# Patient Record
Sex: Male | Born: 1935 | Race: White | Hispanic: No | Marital: Married | Smoking: Never smoker
Health system: Southern US, Community
[De-identification: ages and names within clinical notes are randomized; demographics above are authoritative.]

## PROBLEM LIST (undated history)

## (undated) DIAGNOSIS — I2510 Atherosclerotic heart disease of native coronary artery without angina pectoris: Secondary | ICD-10-CM

## (undated) DIAGNOSIS — N289 Disorder of kidney and ureter, unspecified: Secondary | ICD-10-CM

## (undated) DIAGNOSIS — E669 Obesity, unspecified: Secondary | ICD-10-CM

## (undated) DIAGNOSIS — I119 Hypertensive heart disease without heart failure: Secondary | ICD-10-CM

## (undated) DIAGNOSIS — I480 Paroxysmal atrial fibrillation: Secondary | ICD-10-CM

## (undated) DIAGNOSIS — I255 Ischemic cardiomyopathy: Secondary | ICD-10-CM

## (undated) DIAGNOSIS — I340 Nonrheumatic mitral (valve) insufficiency: Secondary | ICD-10-CM

## (undated) DIAGNOSIS — I5042 Chronic combined systolic (congestive) and diastolic (congestive) heart failure: Secondary | ICD-10-CM

## (undated) DIAGNOSIS — E785 Hyperlipidemia, unspecified: Secondary | ICD-10-CM

## (undated) DIAGNOSIS — E119 Type 2 diabetes mellitus without complications: Secondary | ICD-10-CM

## (undated) DIAGNOSIS — R042 Hemoptysis: Secondary | ICD-10-CM

## (undated) HISTORY — PX: JOINT REPLACEMENT: SHX530

## (undated) HISTORY — PX: APPENDECTOMY: SHX54

## (undated) HISTORY — PX: TONSILLECTOMY: SUR1361

---

## 2011-12-06 HISTORY — PX: TOTAL KNEE ARTHROPLASTY: SHX125

## 2013-08-06 ENCOUNTER — Ambulatory Visit: Payer: Medicare Other | Attending: Sports Medicine | Admitting: Physical Therapy

## 2013-08-06 DIAGNOSIS — Z96659 Presence of unspecified artificial knee joint: Secondary | ICD-10-CM | POA: Insufficient documentation

## 2013-08-06 DIAGNOSIS — M25669 Stiffness of unspecified knee, not elsewhere classified: Secondary | ICD-10-CM | POA: Insufficient documentation

## 2013-08-06 DIAGNOSIS — IMO0001 Reserved for inherently not codable concepts without codable children: Secondary | ICD-10-CM | POA: Insufficient documentation

## 2013-08-06 DIAGNOSIS — M25569 Pain in unspecified knee: Secondary | ICD-10-CM | POA: Insufficient documentation

## 2013-08-06 DIAGNOSIS — R609 Edema, unspecified: Secondary | ICD-10-CM | POA: Insufficient documentation

## 2013-08-13 ENCOUNTER — Ambulatory Visit: Payer: Medicare Other | Admitting: Physical Therapy

## 2013-08-15 ENCOUNTER — Ambulatory Visit: Payer: Medicare Other | Admitting: Physical Therapy

## 2013-08-20 ENCOUNTER — Ambulatory Visit: Payer: Medicare Other | Admitting: Physical Therapy

## 2013-08-22 ENCOUNTER — Encounter: Payer: No Typology Code available for payment source | Admitting: Physical Therapy

## 2013-08-27 ENCOUNTER — Ambulatory Visit: Payer: Medicare Other | Admitting: Physical Therapy

## 2014-11-23 ENCOUNTER — Inpatient Hospital Stay (HOSPITAL_COMMUNITY)
Admission: EM | Admit: 2014-11-23 | Discharge: 2014-11-26 | DRG: 280 | Disposition: A | Discharge status: Home Health | Payer: Medicare HMO | Attending: Cardiology | Admitting: Cardiology

## 2014-11-23 ENCOUNTER — Emergency Department (HOSPITAL_COMMUNITY): Payer: Medicare HMO

## 2014-11-23 ENCOUNTER — Encounter (HOSPITAL_COMMUNITY): Payer: Self-pay | Admitting: *Deleted

## 2014-11-23 DIAGNOSIS — E785 Hyperlipidemia, unspecified: Secondary | ICD-10-CM | POA: Diagnosis present

## 2014-11-23 DIAGNOSIS — Z6830 Body mass index (BMI) 30.0-30.9, adult: Secondary | ICD-10-CM | POA: Diagnosis not present

## 2014-11-23 DIAGNOSIS — F419 Anxiety disorder, unspecified: Secondary | ICD-10-CM | POA: Diagnosis not present

## 2014-11-23 DIAGNOSIS — Z8249 Family history of ischemic heart disease and other diseases of the circulatory system: Secondary | ICD-10-CM

## 2014-11-23 DIAGNOSIS — R0902 Hypoxemia: Secondary | ICD-10-CM

## 2014-11-23 DIAGNOSIS — E119 Type 2 diabetes mellitus without complications: Secondary | ICD-10-CM | POA: Diagnosis present

## 2014-11-23 DIAGNOSIS — R0600 Dyspnea, unspecified: Secondary | ICD-10-CM | POA: Diagnosis present

## 2014-11-23 DIAGNOSIS — I2582 Chronic total occlusion of coronary artery: Secondary | ICD-10-CM | POA: Diagnosis present

## 2014-11-23 DIAGNOSIS — Z96652 Presence of left artificial knee joint: Secondary | ICD-10-CM | POA: Diagnosis not present

## 2014-11-23 DIAGNOSIS — I252 Old myocardial infarction: Secondary | ICD-10-CM | POA: Diagnosis present

## 2014-11-23 DIAGNOSIS — I10 Essential (primary) hypertension: Secondary | ICD-10-CM | POA: Diagnosis not present

## 2014-11-23 DIAGNOSIS — I5023 Acute on chronic systolic (congestive) heart failure: Secondary | ICD-10-CM | POA: Diagnosis present

## 2014-11-23 DIAGNOSIS — R0602 Shortness of breath: Secondary | ICD-10-CM | POA: Diagnosis not present

## 2014-11-23 DIAGNOSIS — J810 Acute pulmonary edema: Secondary | ICD-10-CM

## 2014-11-23 DIAGNOSIS — I2510 Atherosclerotic heart disease of native coronary artery without angina pectoris: Secondary | ICD-10-CM | POA: Diagnosis present

## 2014-11-23 DIAGNOSIS — I214 Non-ST elevation (NSTEMI) myocardial infarction: Secondary | ICD-10-CM | POA: Diagnosis present

## 2014-11-23 DIAGNOSIS — E669 Obesity, unspecified: Secondary | ICD-10-CM | POA: Diagnosis present

## 2014-11-23 DIAGNOSIS — I480 Paroxysmal atrial fibrillation: Secondary | ICD-10-CM | POA: Diagnosis present

## 2014-11-23 DIAGNOSIS — I509 Heart failure, unspecified: Secondary | ICD-10-CM

## 2014-11-23 DIAGNOSIS — I5021 Acute systolic (congestive) heart failure: Secondary | ICD-10-CM

## 2014-11-23 DIAGNOSIS — Z823 Family history of stroke: Secondary | ICD-10-CM

## 2014-11-23 DIAGNOSIS — I5022 Chronic systolic (congestive) heart failure: Secondary | ICD-10-CM | POA: Diagnosis present

## 2014-11-23 DIAGNOSIS — E1169 Type 2 diabetes mellitus with other specified complication: Secondary | ICD-10-CM

## 2014-11-23 HISTORY — DX: Type 2 diabetes mellitus without complications: E11.9

## 2014-11-23 HISTORY — DX: Atherosclerotic heart disease of native coronary artery without angina pectoris: I25.10

## 2014-11-23 HISTORY — DX: Hyperlipidemia, unspecified: E78.5

## 2014-11-23 HISTORY — DX: Paroxysmal atrial fibrillation: I48.0

## 2014-11-23 LAB — TSH: TSH: 5.21 u[IU]/mL — AB (ref 0.350–4.500)

## 2014-11-23 LAB — BASIC METABOLIC PANEL
ANION GAP: 19 — AB (ref 5–15)
BUN: 14 mg/dL (ref 6–23)
CALCIUM: 9.3 mg/dL (ref 8.4–10.5)
CO2: 19 meq/L (ref 19–32)
Chloride: 101 mEq/L (ref 96–112)
Creatinine, Ser: 1.05 mg/dL (ref 0.50–1.35)
GFR calc Af Amer: 74 mL/min — ABNORMAL LOW (ref >90)
GFR calc non Af Amer: 64 mL/min — ABNORMAL LOW (ref >90)
Glucose, Bld: 262 mg/dL — ABNORMAL HIGH (ref 70–99)
Potassium: 3.9 mEq/L (ref 3.7–5.3)
SODIUM: 139 meq/L (ref 137–147)

## 2014-11-23 LAB — CBC WITH DIFFERENTIAL/PLATELET
BASOS ABS: 0 10*3/uL (ref 0.0–0.1)
BASOS PCT: 0 % (ref 0–1)
EOS ABS: 0.1 10*3/uL (ref 0.0–0.7)
Eosinophils Relative: 1 % (ref 0–5)
HCT: 45.5 % (ref 39.0–52.0)
Hemoglobin: 14.6 g/dL (ref 13.0–17.0)
Lymphocytes Relative: 11 % — ABNORMAL LOW (ref 12–46)
Lymphs Abs: 1.1 10*3/uL (ref 0.7–4.0)
MCH: 30.5 pg (ref 26.0–34.0)
MCHC: 32.1 g/dL (ref 30.0–36.0)
MCV: 95 fL (ref 78.0–100.0)
Monocytes Absolute: 0.7 10*3/uL (ref 0.1–1.0)
Monocytes Relative: 7 % (ref 3–12)
NEUTROS PCT: 81 % — AB (ref 43–77)
Neutro Abs: 7.9 10*3/uL — ABNORMAL HIGH (ref 1.7–7.7)
PLATELETS: 159 10*3/uL (ref 150–400)
RBC: 4.79 MIL/uL (ref 4.22–5.81)
RDW: 14 % (ref 11.5–15.5)
WBC: 9.7 10*3/uL (ref 4.0–10.5)

## 2014-11-23 LAB — PRO B NATRIURETIC PEPTIDE: PRO B NATRI PEPTIDE: 2215 pg/mL — AB (ref 0–450)

## 2014-11-23 LAB — LIPID PANEL
CHOLESTEROL: 173 mg/dL (ref 0–200)
HDL: 48 mg/dL (ref >39)
LDL CALC: 89 mg/dL (ref 0–99)
Total CHOL/HDL Ratio: 3.6 RATIO
Triglycerides: 182 mg/dL — ABNORMAL HIGH (ref <150)
VLDL: 36 mg/dL (ref 0–40)

## 2014-11-23 LAB — GLUCOSE, CAPILLARY
Glucose-Capillary: 118 mg/dL — ABNORMAL HIGH (ref 70–99)
Glucose-Capillary: 121 mg/dL — ABNORMAL HIGH (ref 70–99)
Glucose-Capillary: 107 mg/dL — ABNORMAL HIGH (ref 70–99)

## 2014-11-23 LAB — TROPONIN I
TROPONIN I: 2.41 ng/mL — AB (ref <0.30)
Troponin I: 3.51 ng/mL (ref <0.30)
Troponin I: 4.74 ng/mL (ref <0.30)
Troponin I: 3.91 ng/mL (ref <0.30)

## 2014-11-23 LAB — HEPARIN LEVEL (UNFRACTIONATED): HEPARIN UNFRACTIONATED: 0.31 [IU]/mL (ref 0.30–0.70)

## 2014-11-23 LAB — MRSA PCR SCREENING: MRSA by PCR: NEGATIVE

## 2014-11-23 MED ORDER — SODIUM CHLORIDE 0.9 % IJ SOLN: 3.0000 mL | INTRAMUSCULAR | Status: DC | PRN

## 2014-11-23 MED ORDER — DILTIAZEM HCL 100 MG IV SOLR
5.0000 mg/h | Freq: Once | INTRAVENOUS | Status: AC
  Administered 2014-11-23: 5 mg/h via INTRAVENOUS

## 2014-11-23 MED ORDER — ACETAMINOPHEN 325 MG PO TABS
650.0000 mg | ORAL_TABLET | ORAL | Status: DC | PRN
  Administered 2014-11-23 – 2014-11-25 (×5): 650 mg via ORAL
  Filled 2014-11-23 (×5): qty 2

## 2014-11-23 MED ORDER — HEPARIN BOLUS VIA INFUSION
4000.0000 [IU] | Freq: Once | INTRAVENOUS | Status: AC
  Administered 2014-11-23: 4000 [IU] via INTRAVENOUS
  Filled 2014-11-23: qty 4000

## 2014-11-23 MED ORDER — METOPROLOL TARTRATE 12.5 MG HALF TABLET
12.5000 mg | ORAL_TABLET | Freq: Two times a day (BID) | ORAL | Status: DC
  Administered 2014-11-23 – 2014-11-25 (×6): 12.5 mg via ORAL
  Filled 2014-11-23 (×10): qty 1

## 2014-11-23 MED ORDER — NITROGLYCERIN 0.4 MG SL SUBL
0.4000 mg | SUBLINGUAL_TABLET | SUBLINGUAL | Status: DC | PRN
  Filled 2014-11-23: qty 1

## 2014-11-23 MED ORDER — NITROGLYCERIN IN D5W 200-5 MCG/ML-% IV SOLN
10.0000 ug/min | INTRAVENOUS | Status: DC
  Administered 2014-11-23: 10 ug/min via INTRAVENOUS
  Administered 2014-11-24: 25 ug/min via INTRAVENOUS
  Administered 2014-11-25: 19:00:00 30 ug/min via INTRAVENOUS
  Filled 2014-11-23 (×4): qty 250

## 2014-11-23 MED ORDER — INSULIN ASPART 100 UNIT/ML ~~LOC~~ SOLN: 0.0000 [IU] | SUBCUTANEOUS | Status: DC

## 2014-11-23 MED ORDER — FUROSEMIDE 10 MG/ML IJ SOLN
40.0000 mg | Freq: Two times a day (BID) | INTRAMUSCULAR | Status: DC
  Administered 2014-11-23 (×2): 40 mg via INTRAVENOUS
  Filled 2014-11-23 (×2): qty 4

## 2014-11-23 MED ORDER — SODIUM CHLORIDE 0.9 % IV SOLN
1.0000 mL/kg/h | INTRAVENOUS | Status: DC
  Administered 2014-11-24: 1 mL/kg/h via INTRAVENOUS

## 2014-11-23 MED ORDER — INSULIN ASPART 100 UNIT/ML ~~LOC~~ SOLN
0.0000 [IU] | SUBCUTANEOUS | Status: DC
  Administered 2014-11-23 – 2014-11-24 (×3): 2 [IU] via SUBCUTANEOUS

## 2014-11-23 MED ORDER — DILTIAZEM HCL 25 MG/5ML IV SOLN
20.0000 mg | Freq: Once | INTRAVENOUS | Status: AC
  Administered 2014-11-23: 20 mg via INTRAVENOUS
  Filled 2014-11-23: qty 5

## 2014-11-23 MED ORDER — ONDANSETRON HCL 4 MG/2ML IJ SOLN: 4.0000 mg | Freq: Four times a day (QID) | INTRAMUSCULAR | Status: DC | PRN

## 2014-11-23 MED ORDER — ASPIRIN 81 MG PO CHEW: 324.0000 mg | CHEWABLE_TABLET | ORAL | Status: DC

## 2014-11-23 MED ORDER — ASPIRIN 81 MG PO CHEW
324.0000 mg | CHEWABLE_TABLET | Freq: Once | ORAL | Status: AC
  Administered 2014-11-23: 324 mg via ORAL
  Filled 2014-11-23: qty 4

## 2014-11-23 MED ORDER — SODIUM CHLORIDE 0.9 % IV SOLN: 250.0000 mL | INTRAVENOUS | Status: DC | PRN

## 2014-11-23 MED ORDER — POTASSIUM CHLORIDE CRYS ER 20 MEQ PO TBCR
20.0000 meq | EXTENDED_RELEASE_TABLET | Freq: Every day | ORAL | Status: DC
  Administered 2014-11-23 – 2014-11-26 (×4): 20 meq via ORAL
  Filled 2014-11-23 (×4): qty 1

## 2014-11-23 MED ORDER — FUROSEMIDE 10 MG/ML IJ SOLN
40.0000 mg | Freq: Once | INTRAMUSCULAR | Status: AC
  Administered 2014-11-23: 40 mg via INTRAVENOUS
  Filled 2014-11-23: qty 4

## 2014-11-23 MED ORDER — ASPIRIN EC 81 MG PO TBEC
81.0000 mg | DELAYED_RELEASE_TABLET | Freq: Every day | ORAL | Status: DC
  Administered 2014-11-24: 81 mg via ORAL
  Filled 2014-11-23: qty 1

## 2014-11-23 MED ORDER — INFLUENZA VAC SPLIT QUAD 0.5 ML IM SUSY
0.5000 mL | PREFILLED_SYRINGE | INTRAMUSCULAR | Status: AC
  Administered 2014-11-24: 0.5 mL via INTRAMUSCULAR
  Filled 2014-11-23: qty 0.5

## 2014-11-23 MED ORDER — SODIUM CHLORIDE 0.9 % IJ SOLN
3.0000 mL | Freq: Two times a day (BID) | INTRAMUSCULAR | Status: DC
Start: 2014-11-23 — End: 2014-11-24

## 2014-11-23 MED ORDER — HEPARIN (PORCINE) IN NACL 100-0.45 UNIT/ML-% IJ SOLN
1450.0000 [IU]/h | INTRAMUSCULAR | Status: DC
  Administered 2014-11-23: 1450 [IU]/h via INTRAVENOUS
  Filled 2014-11-23 (×2): qty 250

## 2014-11-23 MED ORDER — HEPARIN (PORCINE) IN NACL 100-0.45 UNIT/ML-% IJ SOLN
1350.0000 [IU]/h | INTRAMUSCULAR | Status: DC
  Administered 2014-11-23: 1350 [IU]/h via INTRAVENOUS
  Filled 2014-11-23: qty 250

## 2014-11-23 MED ORDER — ASPIRIN 81 MG PO CHEW
81.0000 mg | CHEWABLE_TABLET | ORAL | Status: AC
  Administered 2014-11-24: 81 mg via ORAL
  Filled 2014-11-23: qty 1

## 2014-11-23 MED ORDER — ATORVASTATIN CALCIUM 40 MG PO TABS
40.0000 mg | ORAL_TABLET | Freq: Every day | ORAL | Status: DC
  Administered 2014-11-23 – 2014-11-25 (×3): 40 mg via ORAL
  Filled 2014-11-23 (×4): qty 1

## 2014-11-23 MED ORDER — ASPIRIN 300 MG RE SUPP: 300.0000 mg | RECTAL | Status: DC

## 2014-11-23 MED ORDER — CHLORHEXIDINE GLUCONATE CLOTH 2 % EX PADS
6.0000 | MEDICATED_PAD | Freq: Every day | CUTANEOUS | Status: DC
  Administered 2014-11-24: 6 via TOPICAL

## 2014-11-23 NOTE — ED Provider Notes (Signed)
CSN: [id number]     Arrival date & time 11/23/14  8299 History   First MD Initiated Contact with Patient 11/23/14 0630     Chief Complaint  Patient presents with  . Shortness of Breath     (Consider location/radiation/quality/duration/timing/severity/associated sxs/prior Treatment) Patient is a 80 y.o. male presenting with shortness of breath. The history is provided by the patient and the spouse.  Shortness of Breath He woke up this morning with severe dyspnea. He denies chest pain, heaviness, tightness, pressure. Dyspnea is worse when laying flat. He also had some milder dyspnea the previous night. He has had a slight cough which has been nonproductive. There is been no nausea or vomiting. There has been some mild diaphoresis. He is not on anything at home to treat his symptoms. He has noted some swelling of his ankles over the last several days.  Past Medical History  Diagnosis Date  . Hypertension   . Diabetes mellitus without complication    No past surgical history on file. No family history on file. History  Substance Use Topics  . Smoking status: Not on file  . Smokeless tobacco: Not on file  . Alcohol Use: Not on file    Review of Systems  Respiratory: Positive for shortness of breath.   All other systems reviewed and are negative.     Allergies  Review of patient's allergies indicates not on file.  Home Medications   Prior to Admission medications   Not on File   BP 163/79 mmHg  Pulse 86  Temp(Src) 98.6 F (37 C) (Oral)  Resp 25  Ht 5\' 11"  (1.803 m)  Wt 220 lb (99.791 kg)  BMI 30.70 kg/m2  SpO2 94% Physical Exam  Nursing note and vitals reviewed.  80 year old male, resting comfortably and in no acute distress. Vital signs are significant for tachycardia, tachypnea, and hypertension. Oxygen saturation is 88%, which is hypoxic. Oxygen saturation came up to 93% with supplemental oxygen, which is normal. Head is normocephalic and atraumatic. PERRLA,  EOMI. Oropharynx is clear. Neck is nontender and supple without adenopathy. JVD is present at 90. Back is nontender and there is no CVA tenderness. There is 1+ presacral edema. Lungs have coarse rales and rhonchi diffusely. No wheezing is present. Chest is nontender. Heart is tachycardic without murmur. Abdomen is soft, flat, nontender without masses or hepatosplenomegaly and peristalsis is normoactive. Extremities have 1+ edema of the left pretibial area, and 2+ edema of the right pretibial area, full range of motion is present. Skin is warm and dry without rash. Neurologic: Mental status is normal, cranial nerves are intact, there are no motor or sensory deficits.  ED Course  Procedures (including critical care time) Labs Review Results for orders placed or performed during the hospital encounter of 11/23/14  MRSA PCR Screening  Result Value Ref Range   MRSA by PCR NEGATIVE NEGATIVE  Basic metabolic panel  Result Value Ref Range   Sodium 139 137 - 147 mEq/L   Potassium 3.9 3.7 - 5.3 mEq/L   Chloride 101 96 - 112 mEq/L   CO2 19 19 - 32 mEq/L   Glucose, Bld 262 (H) 70 - 99 mg/dL   BUN 14 6 - 23 mg/dL   Creatinine, Ser 1.05 0.50 - 1.35 mg/dL   Calcium 9.3 8.4 - 10.5 mg/dL   GFR calc non Af Amer 64 (L) >90 mL/min   GFR calc Af Amer 74 (L) >90 mL/min   Anion gap 19 (H) 5 -  15  Pro b natriuretic peptide (BNP)  Result Value Ref Range   Pro B Natriuretic peptide (BNP) 2215.0 (H) 0 - 450 pg/mL  Troponin I  Result Value Ref Range   Troponin I 3.51 (HH) <0.30 ng/mL  CBC with Differential  Result Value Ref Range   WBC 9.7 4.0 - 10.5 K/uL   RBC 4.79 4.22 - 5.81 MIL/uL   Hemoglobin 14.6 13.0 - 17.0 g/dL   HCT 45.5 39.0 - 52.0 %   MCV 95.0 78.0 - 100.0 fL   MCH 30.5 26.0 - 34.0 pg   MCHC 32.1 30.0 - 36.0 g/dL   RDW 14.0 11.5 - 15.5 %   Platelets 159 150 - 400 K/uL   Neutrophils Relative % 81 (H) 43 - 77 %   Neutro Abs 7.9 (H) 1.7 - 7.7 K/uL   Lymphocytes Relative 11 (L) 12 - 46  %   Lymphs Abs 1.1 0.7 - 4.0 K/uL   Monocytes Relative 7 3 - 12 %   Monocytes Absolute 0.7 0.1 - 1.0 K/uL   Eosinophils Relative 1 0 - 5 %   Eosinophils Absolute 0.1 0.0 - 0.7 K/uL   Basophils Relative 0 0 - 1 %   Basophils Absolute 0.0 0.0 - 0.1 K/uL  Heparin level (unfractionated)  Result Value Ref Range   Heparin Unfractionated 0.31 0.30 - 0.70 IU/mL  Troponin I (q 6hr x 3)  Result Value Ref Range   Troponin I 2.41 (HH) <0.30 ng/mL  Troponin I (q 6hr x 3)  Result Value Ref Range   Troponin I 4.74 (HH) <0.30 ng/mL  Troponin I (q 6hr x 3)  Result Value Ref Range   Troponin I 3.91 (HH) <0.30 ng/mL  Lipid panel  Result Value Ref Range   Cholesterol 173 0 - 200 mg/dL   Triglycerides 182 (H) <150 mg/dL   HDL 48 >39 mg/dL   Total CHOL/HDL Ratio 3.6 RATIO   VLDL 36 0 - 40 mg/dL   LDL Cholesterol 89 0 - 99 mg/dL  TSH  Result Value Ref Range   TSH 5.210 (H) 0.350 - 4.500 uIU/mL  Glucose, capillary  Result Value Ref Range   Glucose-Capillary 118 (H) 70 - 99 mg/dL  Glucose, capillary  Result Value Ref Range   Glucose-Capillary 121 (H) 70 - 99 mg/dL  Glucose, capillary  Result Value Ref Range   Glucose-Capillary 107 (H) 70 - 99 mg/dL   Imaging Review Dg Chest Port 1 View  11/23/2014   CLINICAL DATA:  Acute onset of respiratory distress and shortness of breath. Audible rales and rhonchi. Initial encounter.  EXAM: PORTABLE CHEST - 1 VIEW  COMPARISON:  None.  FINDINGS: The lungs are well-aerated. Vascular congestion is noted. Bilateral central airspace opacities raise concern for pulmonary edema. A small left pleural effusion is suspected. No pneumothorax is seen.  The cardiomediastinal silhouette is borderline enlarged. No acute osseous abnormalities are seen.  IMPRESSION: Vascular congestion and borderline cardiomegaly. Bilateral central airspace opacities raise concern for pulmonary edema. Suspect small left pleural effusion.   Electronically Signed   By: Garald Balding M.D.   On:  11/23/2014 06:56   Images viewed by me.   EKG Interpretation   Date/Time:  Sunday November 23 2014 06:30:34 EST Ventricular Rate:  140 PR Interval:  79 QRS Duration: 117 QT Interval:  323 QTC Calculation: 493 R Axis:   -37 Text Interpretation:  Supraventricular tachycardia Nonspecific IVCD with  LAD LVH with secondary repolarization abnormality Anterior infarct, old  Suspect Atrial fibrillation with rapid ventricular response No old tracing  to compare Confirmed by [facility]  MD, Tajha Sammarco ([id number]) on 11/23/2014 6:42:53 AM       Date: 11/23/2014 0721  Rate: 84  Rhythm: normal sinus rhythm and premature ventricular contractions (PVC)  QRS Axis: left  Intervals: normal  ST/T Wave abnormalities: normal  Conduction Disutrbances:nonspecific intraventricular conduction delay  Narrative Interpretation: Sinus rhythm with PVCs, left ventricular hypertrophy, nonspecific intraventricular conduction delay, borderline left axis deviation. When compared with ECG of earlier today, atrial fibrillation is no longer present, and PVCs are now present  Old EKG Reviewed: changes noted  CRITICAL CARE Performed by: SAYTK,ZSWFU Total critical care time: 60 minutes Critical care time was exclusive of separately billable procedures and treating other patients. Critical care was necessary to treat or prevent imminent or life-threatening deterioration. Critical care was time spent personally by me on the following activities: development of treatment plan with patient and/or surrogate as well as nursing, discussions with consultants, evaluation of patient's response to treatment, examination of patient, obtaining history from patient or surrogate, ordering and performing treatments and interventions, ordering and review of laboratory studies, ordering and review of radiographic studies, pulse oximetry and re-evaluation of patient's condition.  MDM   Final diagnoses:  Acute systolic congestive heart failure   Hypoxia    Acute dyspnea which appears to be congestive heart failure based on rales, neck vein distention, and peripheral edema. Old records are reviewed and he has no relevant past visits. Screening labs are obtained and he is started with treatment of aspirin, furosemide, and nitroglycerin.  Initial ECG appears to show atrial fibrillation. Accordingly, he is also started on diltiazem via bolus and drip to affect her rate control. Following this treatment, heart rate dropped to 80s and was clearly sinus, so diltiazem was discontinued. He noted that he was breathing much better and was no longer diaphoretic. Blood pressure is also much lower. Chest x-ray confirms congestive heart failure with mild pulmonary edema and BNP has come back elevated. Repeat ECG shows no acute ST or T changes.  Delora Fuel, MD 93/23/55 [id number]

## 2014-11-23 NOTE — H&P (Addendum)
CARDIOLOGY HISTORY AND PHYSICAL   Patient ID: [name] [name] MRN: [id number]  DOB/AGE: 1930-05-07 80 y.o. Admit date: 11/23/2014  Primary Care Physician: Stephens Shire, MD Primary Cardiologist: Claris Che MD  Clinical Summary [name] [name] is a 80 y.o.male with no prior cardiac history presented to ER after 2 weeks of worsening dyspnea, PND, orthopnea, and LEE. Hx of DM, Hypertension, BPH. [name] [name] is normally very active, bowling, walking, fishing and has noticed over the last 2 months that his energy level has declined. Over the last two nights [name] [name] was unable to sleep, awakening short of breath with coughing and congestion. Last night [name] [name] had significant PND and orthopnea prompting him to come to ER. Denies chest pain, dizziness or near syncope. Wife said [name] [name] was gasping for breath this am.   On arrival to ER [name] [name] was in rapid atrial fib with rate of 140 bpm, with infero/lateral ST depression and intraventricular conduction abnormality. CXR determined pulmonary edema, with bilateral airspace abnormalities, borderline cardiomegaly. Pro-BNP 2,231. Creatinine 1.05. Initial troponin 3.51. [name] [name] was treated with IV lasix 40 mg, started on diltiazem gtt and converted to NSR.  [name] [name] is on NTG gtt and O2 via [place]. Breathing status is better, but continues to have DOE, moving around during assessment.   No Known Allergies  Home Medications  (Not in a hospital admission)  Scheduled Medications    Infusions . heparin     And  . heparin    . nitroGLYCERIN 10 mcg/min (11/23/14 0713)    . aspirin  324 mg Oral NOW   Or  . aspirin  300 mg Rectal NOW  . [START ON 11/24/2014] aspirin  81 mg Oral Pre-Cath  . [START ON 11/24/2014] aspirin EC  81 mg Oral Daily  . atorvastatin  40 mg Oral q1800  . [START ON 11/24/2014] Chlorhexidine Gluconate Cloth  6 each Topical Q0600  . furosemide  40 mg Intravenous BID  . [START ON 11/24/2014] insulin aspart  0-9 Units Subcutaneous 6 times per day  . metoprolol  tartrate  12.5 mg Oral BID  . potassium chloride  20 mEq Oral Daily  . sodium chloride  3 mL Intravenous Q12H     PRN Medications    Past Medical History  Diagnosis Date  . Hypertension   . Diabetes mellitus without complication     Past Surgical History  Procedure Laterality Date  . Appendectomy    . Knee Left 2013    Left Total Knee replacement    Family History  Problem Relation Age of Onset  . Cancer Father 72    Deceased  . Stroke Mother     Deceased  . Heart attack Brother     Deceased. Had cath with stents followed by PE    Social History [name] [name] reports that [name] [name] has never smoked. [name] [name] has never used smokeless tobacco. [name] [name] reports that [name] [name] drinks about 4.2 oz of alcohol per week.  Review of Systems Otherwise reviewed and negative except as outlined.  Physical Examination Temp:  [98.6 F (37 C)] 98.6 F (37 C) (12/20 0642) Pulse Rate:  [80-92] 80 (12/20 0815) Resp:  [21-28] 23 (12/20 0815) BP: (118-212)/(63-103) 118/70 mmHg (12/20 0815) SpO2:  [90 %-95 %] 93 % (12/20 0815) Weight:  [220 lb (99.791 kg)] 220 lb (99.791 kg) (12/20 0642) No intake or output data in the 24 hours ending 11/23/14 0857  Gen: Mildly dyspneic on O2 HEENT: Conjunctiva and lids normal, oropharynx clear with moist mucosa. Neck: Supple,  obese, no elevated JVP or carotid bruits, no thyromegaly. Lungs: Bilateral crackles worse on the right Cardiac: Regular rate and rhythm, 1/6 systolic murmur,  no S3  no pericardial rub. Abdomen: Mildly distended, nontender, no hepatomegaly, bowel sounds present, no guarding or rebound. Extremities: 2+ pre-tibial pitting edema, distal pulses 2+. Mild coolness in the toes. Skin: Warm and dry. Musculoskeletal: No kyphosis. Neuropsychiatric: Alert and oriented x3, affect grossly appropriate.   Lab Results  Basic Metabolic Panel:  Recent Labs Lab 11/23/14 0636  NA 139  K 3.9  CL 101  CO2 19  GLUCOSE 262*  BUN 14  CREATININE 1.05   CALCIUM 9.3    CBC:  Recent Labs Lab 11/23/14 0734  WBC 9.7  NEUTROABS 7.9*  HGB 14.6  HCT 45.5  MCV 95.0  PLT 159    Cardiac Enzymes:  Recent Labs Lab 11/23/14 0636  TROPONINI 3.51*    BNP: Invalid input(s): Ali Chukson   Radiology Dg Chest Port 1 View  11/23/2014   CLINICAL DATA:  Acute onset of respiratory distress and shortness of breath. Audible rales and rhonchi. Initial encounter.  EXAM: PORTABLE CHEST - 1 VIEW  COMPARISON:  None.  FINDINGS: The lungs are well-aerated. Vascular congestion is noted. Bilateral central airspace opacities raise concern for pulmonary edema. A small left pleural effusion is suspected. No pneumothorax is seen.  The cardiomediastinal silhouette is borderline enlarged. No acute osseous abnormalities are seen.  IMPRESSION: Vascular congestion and borderline cardiomegaly. Bilateral central airspace opacities raise concern for pulmonary edema. Suspect small left pleural effusion.   Electronically Signed   By: Garald Balding M.D.   On: 11/23/2014 06:56    Prior Cardiac Testing/Procedures:  None  ECG: Atrial fib with RVR, ST-T wave abnormalities infero/lateral.    Impression and Recommendations  1. NSTEMI:  EKG with infero/lateral depression during rapid rate with pulmonary edema. Demand ischemia in addition to probable CAD with CVRF of DM, Hypertension, Obesity, FH. [name] [name] will be planned for cardiac cath tomorrow. Cycle troponin, repeat EKG now that [name] [name] is NSR. Continue NTG but consider weaning off without chest pain and hypotension. Awaiting pharmacy to review home medication list., Start low dose BB, add ASA, statin, continue heparin, consider low dose ACE. Admit to cardiology.   2. Acute Pulmonary Edema:  Will check echo. Continue IV lasix, watch BP on NTG and diuretic. Potassium replacement. Strict I/O and daily wts.   3. Hypertension: Hypertensive on admission. Now normalized on NTG gtt.   4. Diabetes: Will check Hgb A1C. Will need to be on  sliding scale insulin. May consult PTH for diabetes management if necessary. Glucose 262 on admission.   Signed: Phill Myron. Lawrence NP AACC  11/23/2014, 8:57 AM Co-Sign MD   The patient was seen, examined and discussed with Jory Sims, NP and I agree with the above.   80 year old male with no prior cardiac history who presented with progressively worsening SOB, LE edema, orthopnea and PND that [name] [name] first noticed about a month ago. The patient is very functional at baseline, involved indifferent sports. On arrival to the ER [name] [name] was found to be in rapid atrial fibrillation that shortly afterwards cardioverted to SR (1 hour after iv Diltiazem initiation). [name] [name] is significantly fluid overloaded on physical exam with elevated troponin. Started on Heparin infusion. [name] [name] responded very well to the first dose of Lasix iv, we will continue iv diuresis and plan for cardiac cath for tomorrow. The patient will need to be re-evaluated in the am to see  if [name] [name] is able to lay flat. Continue iv Heparin, ASA, started on atorvastatin and metoprolol, we will uptitrate as needed for elevated BP. Echocardiogram is pending. We will hold metformin for cath.  Hold ACEI prior to cath as we are diuresing aggressively.   Dorothy Spark 11/23/2014

## 2014-11-23 NOTE — Progress Notes (Signed)
ANTICOAGULATION CONSULT NOTE - Initial Consult  Pharmacy Consult for Heparin Indication: chest pain/ACS  No Known Allergies  Patient Measurements: Height: 5\' 11"  (180.3 cm) Weight: 220 lb (99.791 kg) IBW/kg (Calculated) : 75.3 Heparin Dosing Weight: 89.5  Vital Signs: Temp: 98.6 F (37 C) (12/20 0642) Temp Source: Oral (12/20 0642) BP: 118/70 mmHg (12/20 0815) Pulse Rate: 80 (12/20 0815)  Labs:  Recent Labs  11/23/14 0636 11/23/14 0734  HGB  --  14.6  HCT  --  45.5  PLT  --  159  CREATININE 1.05  --   TROPONINI 3.51*  --    Estimated Creatinine Clearance: 65.3 mL/min (by C-G formula based on Cr of 1.05).  Medical History: Past Medical History  Diagnosis Date  . Hypertension   . Diabetes mellitus without complication    Medications:  Infusions:  . heparin     And  . heparin    . nitroGLYCERIN 10 mcg/min (11/23/14 8341)   Assessment: 80 yo male presents with c/o sob and difficulty breathing.  Labs reveal an elevated troponin of 3.51.  We have been asked to start IV heparin while a cardiac work-up is in process.  His H/H is WNL (14.6/45.5) and a platelet count of 159K.  He does not know home meds nor does family members.  Plan is to f/u with his pharmacy when they open.  Goal of Therapy:  Heparin level 0.3-0.7 units/ml Monitor platelets by anticoagulation protocol: Yes   Plan:  1.  Heparin 4000 units IV bolus x 1 then begin continuous infusion at 1350 units/hr.   2.  Obtain a heparin level 8 hours after starting. 3.  Monitor closely for s/s of bleeding  Rober Minion, PharmD., MS Clinical Pharmacist Pager:  [phone] Thank you for allowing pharmacy to be part of this patients care team. 11/23/2014,8:31 AM

## 2014-11-23 NOTE — Progress Notes (Signed)
ANTICOAGULATION CONSULT NOTE - Follow Up Consult  Pharmacy Consult for Heparin Indication: chest pain/ACS  No Known Allergies  Patient Measurements: Height: 5\' 11"  (180.3 cm) Weight: 218 lb 11.2 oz (99.202 kg) IBW/kg (Calculated) : 75.3 Heparin Dosing Weight: 95.6kg  Vital Signs: Temp: 98 F (36.7 C) (12/20 1214) Temp Source: Oral (12/20 1214) BP: 152/88 mmHg (12/20 1614) Pulse Rate: 79 (12/20 1614)  Labs:  Recent Labs  11/23/14 0636 11/23/14 0734 11/23/14 0903 11/23/14 1554  HGB  --  14.6  --   --   HCT  --  45.5  --   --   PLT  --  159  --   --   HEPARINUNFRC  --   --   --  0.31  CREATININE 1.05  --   --   --   TROPONINI 3.51*  --  2.41* 4.74*    Estimated Creatinine Clearance: 65.1 mL/min (by C-G formula based on Cr of 1.05).   Medications:  Heparin 1350 units/hr  Assessment: [name] [name] on heparin for CP/ACS. Heparin level (0.31) is just above goal range. Patient received a bolus ~6 hours prior that may affect level so will increase dose slightly and check follow-up level. Noted plans for cath tomorrow.  - H/H and Plts wnl - No significant bleeding reported.   Goal of Therapy:  Heparin level 0.3-0.7 units/ml Monitor platelets by anticoagulation protocol: Yes   Plan:  1. Increase heparin drip to 1450 units/hr (14.5 ml/hr) 2. Check heparin level and CBC 8 hours after rate increase 3. Follow-up post cath orders tomorrow  [place]ler, [place] 11/23/2014,6:02 PM

## 2014-11-23 NOTE — ED Notes (Signed)
Dr. Mingo Amber made aware of critical troponin

## 2014-11-23 NOTE — ED Notes (Signed)
MD at bedside. 

## 2014-11-23 NOTE — ED Notes (Signed)
Patient presents with wife stating that she woke up and he was having a hard time breathing.  Patient appears to be in distress sitting on the side of the bed.  Audible rales/rhonchi noted

## 2014-11-23 NOTE — ED Notes (Signed)
MD Meda Coffee phoned, bed request for step-down.

## 2014-11-23 NOTE — ED Notes (Signed)
Spoke with LAB CBC is clotted.

## 2014-11-24 ENCOUNTER — Encounter (HOSPITAL_COMMUNITY)
Admission: EM | Disposition: A | Discharge status: Home Health | Payer: Medicare HMO | Source: Home / Self Care | Attending: Cardiology

## 2014-11-24 ENCOUNTER — Encounter (HOSPITAL_COMMUNITY): Payer: Self-pay | Admitting: General Practice

## 2014-11-24 DIAGNOSIS — I214 Non-ST elevation (NSTEMI) myocardial infarction: Secondary | ICD-10-CM | POA: Diagnosis not present

## 2014-11-24 DIAGNOSIS — I5021 Acute systolic (congestive) heart failure: Secondary | ICD-10-CM

## 2014-11-24 DIAGNOSIS — E1169 Type 2 diabetes mellitus with other specified complication: Secondary | ICD-10-CM

## 2014-11-24 DIAGNOSIS — E669 Obesity, unspecified: Secondary | ICD-10-CM

## 2014-11-24 DIAGNOSIS — I10 Essential (primary) hypertension: Secondary | ICD-10-CM

## 2014-11-24 DIAGNOSIS — I059 Rheumatic mitral valve disease, unspecified: Secondary | ICD-10-CM

## 2014-11-24 HISTORY — PX: LEFT HEART CATHETERIZATION WITH CORONARY ANGIOGRAM: SHX5451

## 2014-11-24 HISTORY — PX: CARDIAC CATHETERIZATION: SHX172

## 2014-11-24 LAB — BASIC METABOLIC PANEL
Anion gap: 15 (ref 5–15)
BUN: 19 mg/dL (ref 6–23)
CO2: 27 mEq/L (ref 19–32)
Calcium: 9 mg/dL (ref 8.4–10.5)
Chloride: 100 mEq/L (ref 96–112)
Creatinine, Ser: 1.06 mg/dL (ref 0.50–1.35)
GFR calc Af Amer: 73 mL/min — ABNORMAL LOW (ref >90)
GFR calc non Af Amer: 63 mL/min — ABNORMAL LOW (ref >90)
GLUCOSE: 124 mg/dL — AB (ref 70–99)
Potassium: 3.6 mEq/L — ABNORMAL LOW (ref 3.7–5.3)
Sodium: 142 mEq/L (ref 137–147)

## 2014-11-24 LAB — PROTIME-INR
INR: 1.09 (ref 0.00–1.49)
Prothrombin Time: 14.2 seconds (ref 11.6–15.2)

## 2014-11-24 LAB — HEMOGLOBIN A1C
Hgb A1c MFr Bld: 6.4 % — ABNORMAL HIGH (ref <5.7)
Mean Plasma Glucose: 137 mg/dL — ABNORMAL HIGH (ref <117)

## 2014-11-24 LAB — POCT ACTIVATED CLOTTING TIME: Activated Clotting Time: 472 seconds

## 2014-11-24 LAB — GLUCOSE, CAPILLARY
Glucose-Capillary: 117 mg/dL — ABNORMAL HIGH (ref 70–99)
Glucose-Capillary: 123 mg/dL — ABNORMAL HIGH (ref 70–99)
Glucose-Capillary: 125 mg/dL — ABNORMAL HIGH (ref 70–99)
Glucose-Capillary: 125 mg/dL — ABNORMAL HIGH (ref 70–99)
Glucose-Capillary: 126 mg/dL — ABNORMAL HIGH (ref 70–99)
Glucose-Capillary: 97 mg/dL (ref 70–99)

## 2014-11-24 LAB — CBC
HCT: 42.5 % (ref 39.0–52.0)
Hemoglobin: 13.4 g/dL (ref 13.0–17.0)
MCH: 29 pg (ref 26.0–34.0)
MCHC: 31.5 g/dL (ref 30.0–36.0)
MCV: 92 fL (ref 78.0–100.0)
Platelets: 188 10*3/uL (ref 150–400)
RBC: 4.62 MIL/uL (ref 4.22–5.81)
RDW: 14.1 % (ref 11.5–15.5)
WBC: 7.9 10*3/uL (ref 4.0–10.5)

## 2014-11-24 LAB — HEPARIN LEVEL (UNFRACTIONATED): Heparin Unfractionated: 0.37 IU/mL (ref 0.30–0.70)

## 2014-11-24 SURGERY — LEFT HEART CATHETERIZATION WITH CORONARY ANGIOGRAM
Anesthesia: LOCAL

## 2014-11-24 MED ORDER — HEPARIN (PORCINE) IN NACL 100-0.45 UNIT/ML-% IJ SOLN
1600.0000 [IU]/h | INTRAMUSCULAR | Status: DC
  Administered 2014-11-24: 1450 [IU]/h via INTRAVENOUS
  Filled 2014-11-24 (×2): qty 250

## 2014-11-24 MED ORDER — LIDOCAINE HCL (PF) 1 % IJ SOLN
INTRAMUSCULAR | Status: AC
  Filled 2014-11-24: qty 30

## 2014-11-24 MED ORDER — ACETAMINOPHEN 325 MG PO TABS
650.0000 mg | ORAL_TABLET | ORAL | Status: DC | PRN
Start: 2014-11-24 — End: 2014-11-24

## 2014-11-24 MED ORDER — MIDAZOLAM HCL 2 MG/2ML IJ SOLN
INTRAMUSCULAR | Status: AC
  Filled 2014-11-24: qty 2

## 2014-11-24 MED ORDER — NITROGLYCERIN 1 MG/10 ML FOR IR/CATH LAB
INTRA_ARTERIAL | Status: AC
  Filled 2014-11-24: qty 10

## 2014-11-24 MED ORDER — FENTANYL CITRATE 0.05 MG/ML IJ SOLN
INTRAMUSCULAR | Status: AC
  Filled 2014-11-24: qty 2

## 2014-11-24 MED ORDER — ONDANSETRON HCL 4 MG/2ML IJ SOLN: 4.0000 mg | Freq: Four times a day (QID) | INTRAMUSCULAR | Status: DC | PRN

## 2014-11-24 MED ORDER — RAMIPRIL 10 MG PO CAPS
10.0000 mg | ORAL_CAPSULE | Freq: Every day | ORAL | Status: DC
  Administered 2014-11-24 – 2014-11-26 (×3): 10 mg via ORAL
  Filled 2014-11-24 (×4): qty 1

## 2014-11-24 MED ORDER — HEPARIN (PORCINE) IN NACL 2-0.9 UNIT/ML-% IJ SOLN
INTRAMUSCULAR | Status: AC
  Filled 2014-11-24: qty 1500

## 2014-11-24 MED ORDER — TAMSULOSIN HCL 0.4 MG PO CAPS
0.4000 mg | ORAL_CAPSULE | Freq: Every day | ORAL | Status: DC | PRN
  Filled 2014-11-24: qty 1

## 2014-11-24 MED ORDER — HEPARIN (PORCINE) IN NACL 100-0.45 UNIT/ML-% IJ SOLN: 1450.0000 [IU]/h | INTRAMUSCULAR | Status: DC

## 2014-11-24 MED ORDER — POTASSIUM CHLORIDE CRYS ER 20 MEQ PO TBCR
20.0000 meq | EXTENDED_RELEASE_TABLET | Freq: Once | ORAL | Status: AC
  Administered 2014-11-24: 20 meq via ORAL
  Filled 2014-11-24: qty 1

## 2014-11-24 MED ORDER — HEPARIN SODIUM (PORCINE) 1000 UNIT/ML IJ SOLN
INTRAMUSCULAR | Status: AC
  Filled 2014-11-24: qty 1

## 2014-11-24 MED ORDER — VERAPAMIL HCL 2.5 MG/ML IV SOLN
INTRAVENOUS | Status: AC
Start: 2014-11-24 — End: 2014-11-24
  Filled 2014-11-24: qty 2

## 2014-11-24 MED ORDER — BIVALIRUDIN 250 MG IV SOLR
INTRAVENOUS | Status: AC
  Filled 2014-11-24: qty 250

## 2014-11-24 MED ORDER — INSULIN ASPART 100 UNIT/ML ~~LOC~~ SOLN
0.0000 [IU] | Freq: Three times a day (TID) | SUBCUTANEOUS | Status: DC
  Administered 2014-11-25: 2 [IU] via SUBCUTANEOUS

## 2014-11-24 MED ORDER — SODIUM CHLORIDE 0.9 % IV SOLN: 1.0000 mL/kg/h | INTRAVENOUS | Status: AC

## 2014-11-24 MED ORDER — ASPIRIN EC 81 MG PO TBEC
81.0000 mg | DELAYED_RELEASE_TABLET | Freq: Every day | ORAL | Status: DC
  Administered 2014-11-25 – 2014-11-26 (×2): 81 mg via ORAL
  Filled 2014-11-24 (×2): qty 1

## 2014-11-24 NOTE — CV Procedure (Signed)
PROCEDURE:  Left heart catheterization with selective coronary angiography, attempted PCI of the mid circumflex.  INDICATIONS:  Non-STEMI  The risks, benefits, and details of the procedure were explained to the patient.  The patient verbalized understanding and wanted to proceed.  Informed written consent was obtained.  PROCEDURE TECHNIQUE:  After Xylocaine anesthesia a 37F slender sheath was placed in the right radial artery with a single anterior needle wall stick.   IV Heparin was given.  Right coronary angiography was done using a Judkins R4 guide catheter.  Left coronary angiography was done using a Judkins L3.5 guide catheter.  Left heart catheterization was done using a pigtail catheter. The intervention was performed. Please see below for details.  A TR band was used for hemostasis.   CONTRAST:  Total of 1:30 cc.  COMPLICATIONS:  None.    HEMODYNAMICS:  Aortic pressure was 121/70; LV pressure was 125/6; LVEDP 12.  There was no gradient between the left ventricle and aorta.    ANGIOGRAPHIC DATA:   The left main coronary artery is widely patent. There is mild distal disease.  The left anterior descending artery is a large vessel proximally. In the mid vessel, there is moderate diffuse disease. There is a small first diagonal which is patent. The second diagonal is large and widely patent. The remainder of the LAD is patent.  The left circumflex artery is a large vessel proximally. There is a large first obtuse marginal which has mild disease. After the first obtuse marginal, the circumflex is occluded. This leads for the most part into a large OM 2. The OM 2 fills by collaterals from the RCA as well as from the LAD. The proximal portion of the OM 2 appears to be diseased.  The right coronary artery is a large, dominant vessel. There is moderate, diffuse mid vessel disease. The posterior lateral artery gives epicardial collaterals to the distal circumflex territory. The posterior  descending artery gives collaterals to the large OM1.  LEFT VENTRICULOGRAM:  Left ventricular angiogram was not done.  LVEDP was 12 mmHg.  PCI NARRATIVE: IV Angiomax was given. A CLS 3.0 guide catheter was used to engage the left main.  A CT was used to check that the Angiomax was therapeutic. A pro-water wire was advanced to the ostium of the OM 2/mid circumflex occlusion. This wire would not cross. It was left in the true circumflex. A fielder XT was then advanced to the occlusion but this would not cross. A miracle brothers 3 g wire was then advanced and also would not cross. The miracle 3 g wire was removed and placed into an over-the-wire balloon for additional support. Even with this combination, the miracle brothers wire would not cross. We switched out the   3 g wire for a 6 g wire. This 6 g wire with support from a balloon would also not cross. At that point, final angiography was performed. There is no dissection. There is no dye staining. We stopped the procedure at that time to avoid any complications.   IMPRESSIONS:  1. Widely patent left main coronary artery. 2. Mild to moderate disease in the left anterior descending artery and its branches. 3. 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 There are right to left and left to left collaterals which fill this large OM 2 and distal circumflex system. 4. Moderate disease in the mid right coronary artery. 5. Left ventricular systolic function was not  assessed.  LVEDP 12 mmHg.   RECOMMENDATION:  Continue medical therapy. Should be able to start novel oral anticoagulant such as Eliquis tomorrow. If he has further symptoms of angina, would bring him back for CTO PCI of the mid circumflex. The lesion itself is favorable for PCI. I think we would have better support from a femoral approach. He will follow-up with Dr. Meda Coffee. If symptoms persist, I can see him as well. Anticipate discharge tomorrow.  LVEDP was  normal. Will hold Lasix today. He would likely need to go home on a low dose of oral Lasix.

## 2014-11-24 NOTE — Care Management Note (Addendum)
    Page 1 of 2   11/26/2014     11:25:06 AM CARE MANAGEMENT NOTE 11/26/2014  Patient:  [name] [name]   Account Number:  [id number]  Date Initiated:  11/24/2014  Documentation initiated by:  GRAVES-BIGELOW,BRENDA  Subjective/Objective Assessment:   Pt admitted for rapid atrial fib, with infero/lateral ST depression -CXR determined pulmonary edema- Initiated on IV lasix and hep gtt.     Action/Plan:   CM will continue to monitor for disposition needs.   Anticipated DC Date:  11/26/2014   Anticipated DC Plan:  [place]  CM consult  Medication Assistance      Choice offered to / List presented to:             Status of service:  Completed, signed off Medicare Important Message given?  YES (If response is "NO", the following Medicare IM given date fields will be blank) Date Medicare IM given:  11/26/2014 Medicare IM given by:  Denajah Farias Date Additional Medicare IM given:   Additional Medicare IM given by:    Discharge Disposition:  HOME/SELF CARE  Per UR Regulation:  Reviewed for med. necessity/level of care/duration of stay  If discussed at [place] of Stay Meetings, dates discussed:    Comments:  Eann Cleland RN, BSN, MSHL, CCM  Nurse - Case Manager,  (Unit [phone]  11/26/2014 Benefits update given to patient. Eliquis Card provided and [place] medication asssitance application if needed. CM screened for HHS needs.  Patient denies need for HHS and confirms transportation to appts, getting medications as needed and takes meds compliantly.  Denies any falls over the past year. Home / self care.   Bayle Calvo RN, BSN, MSHL, CCM  Nurse - Case Manager,  (Unit [phone]  11/25/2014 Benefits check: IN progress apixaban (ELIQUIS) tablet 5 mg  :  Dose 5 mg  :  Oral  :  2 times daily S/W CINDY @ [facility] # [phone] OPT-1 APIXABAN ( ELIQUIS ) 5 MG  BID COVER- YES CO-PAY- $ 170.62  60 TABLET  FOR 30 DAY SUPPLY TIER---- PRIOR APPROVAL-- NO PHARMACY:  [facility] PATIENT APPROVAL FOR 2016   Elster Corbello RN, BSN, MSHL, CCM  Nurse - Case Manager,  (Unit [phone]  11/24/2014 PROCEDURE:  Left heart catheterization with selective coronary angiography, attempted PCI of the mid circumflex on 11/24/2014

## 2014-11-24 NOTE — Progress Notes (Addendum)
Patient complained of feeling fluttering in his chest and shortness of breath. O2 2L via nasal cannula applied, Monitor sinus rhythm with frequent PAC's noted, increased nitroglycerin infusion as blood pressure remains elevated, checked condom catheter and noted distal part of catheter twisted below penis and unable to drain urine. Untwisted catheter and repositioned with noted drainage. Patient repositioned to a more upright position (had slide down in bed). Patient noted feeling better with less shortness of breath. Lung sounds clear throughout. Patient instructed to call nurse with any further symptoms of discomfort, pain, shortness of breath or fluttering in chest. Patient agreed to call nurse.  1800 Patient reported feeling better but had an anxious feeling in his stomach and unable to eat his meal, just ate apple sauce, lungs remain clear murmur heard in apical area unchanged from initial assessment. O2 2liters nasal cannula with sats in the 90's. Instructed again to call with any further symptoms. 1915 Reported to Geri Seminole RN above. Blood pressure decreasing and in the 160's at this time.

## 2014-11-24 NOTE — Progress Notes (Signed)
[name] [name] in Cath Lab stated it was okay for pt to have a clear liquid tray for breakfast since his cath is scheduled for 1500.

## 2014-11-24 NOTE — Progress Notes (Signed)
Pt able to tolerate laying flat for 30 min per request from [place].

## 2014-11-24 NOTE — Progress Notes (Signed)
Blood pressure remains elevated, checked for urinary retention, patients condom catheter secure and draining dark yellow urine. Patients meds up to date. Increased nitroglycerin infusion, will monitor for blood pressure.

## 2014-11-24 NOTE — Progress Notes (Signed)
Patient Name: General Wearing Date of Encounter: 11/24/2014  Primary Cardiologist: Claris Che MD   Principal Problem:   CHF (congestive heart failure) Active Problems:   NSTEMI (non-ST elevated myocardial infarction)   Essential hypertension   Diabetes mellitus type 2 in obese   Atrial fibrillation    SUBJECTIVE  Still has mild SOB when trying to lay down. Denies any CP  CURRENT MEDS . aspirin  324 mg Oral NOW   Or  . aspirin  300 mg Rectal NOW  . aspirin EC  81 mg Oral Daily  . atorvastatin  40 mg Oral q1800  . Chlorhexidine Gluconate Cloth  6 each Topical Q0600  . furosemide  40 mg Intravenous BID  . Influenza vac split quadrivalent PF  0.5 mL Intramuscular Tomorrow-1000  . insulin aspart  0-24 Units Subcutaneous 6 times per day  . metoprolol tartrate  12.5 mg Oral BID  . potassium chloride  20 mEq Oral Daily  . sodium chloride  3 mL Intravenous Q12H    OBJECTIVE  Filed Vitals:   11/24/14 0001 11/24/14 0400 11/24/14 0431 11/24/14 0743  BP: 144/82 130/93  147/82  Pulse: 76 75  72  Temp: 98.1 F (36.7 C) 98 F (36.7 C)  97.4 F (36.3 C)  TempSrc:    Oral  Resp: 20 20  22   Height:      Weight:  214 lb 12.8 oz (97.433 kg)    SpO2: 94% 95% 95% 95%    Intake/Output Summary (Last 24 hours) at 11/24/14 0816 Last data filed at 11/24/14 0700  Gross per 24 hour  Intake 1107.68 ml  Output   2800 ml  Net -1692.32 ml   Filed Weights   11/23/14 0642 11/23/14 1214 11/24/14 0400  Weight: 220 lb (99.791 kg) 218 lb 11.2 oz (99.202 kg) 214 lb 12.8 oz (97.433 kg)    PHYSICAL EXAM  General: Pleasant, NAD. Neuro: Alert and oriented X 3. Moves all extremities spontaneously. Psych: Normal affect. HEENT:  Normal  Neck: Supple without bruits or JVD. Lungs:  Resp regular and unlabored, CTA Heart: RRR no s3, s4, or murmurs. Abdomen: Soft, non-tender, non-distended, BS + x 4.  Extremities: No clubbing, cyanosis or edema. DP/PT/Radials 2+ and equal bilaterally. L  knee surgical scar seen  Accessory Clinical Findings  CBC  Recent Labs  11/23/14 0734 11/24/14 0347  WBC 9.7 7.9  NEUTROABS 7.9*  --   HGB 14.6 13.4  HCT 45.5 42.5  MCV 95.0 92.0  PLT 159 242   Basic Metabolic Panel  Recent Labs  11/23/14 0636 11/24/14 0347  NA 139 142  K 3.9 3.6*  CL 101 100  CO2 19 27  GLUCOSE 262* 124*  BUN 14 19  CREATININE 1.05 1.06  CALCIUM 9.3 9.0   Cardiac Enzymes  Recent Labs  11/23/14 0903 11/23/14 1554 11/23/14 2139  TROPONINI 2.41* 4.74* 3.91*   Fasting Lipid Panel  Recent Labs  11/23/14 0903  CHOL 173  HDL 48  LDLCALC 89  TRIG 182*  CHOLHDL 3.6   Thyroid Function Tests  Recent Labs  11/23/14 0751  TSH 5.210*    TELE NSR with HR 70s, 1 episode of a-fib with RVR vs SVT aorund 10 pm last night    ECG  NSR with PACs  Echocardiogram  pending    Radiology/Studies  Dg Chest Port 1 View  11/23/2014   CLINICAL DATA:  Acute onset of respiratory distress and shortness of breath. Audible rales and rhonchi. Initial encounter.  EXAM: PORTABLE CHEST - 1 VIEW  COMPARISON:  None.  FINDINGS: The lungs are well-aerated. Vascular congestion is noted. Bilateral central airspace opacities raise concern for pulmonary edema. A small left pleural effusion is suspected. No pneumothorax is seen.  The cardiomediastinal silhouette is borderline enlarged. No acute osseous abnormalities are seen.  IMPRESSION: Vascular congestion and borderline cardiomegaly. Bilateral central airspace opacities raise concern for pulmonary edema. Suspect small left pleural effusion.   Electronically Signed   By: Garald Balding M.D.   On: 11/23/2014 06:56    ASSESSMENT AND PLAN  80 yo male with no past cardiac history present with LEE, orthopnea and PND, found to have a-fib with RVR on arrival which likely has been ongoing for more than a month, terminated on dilt gtt. Also has elevated trop.   1. NSTEMI  - plan for cath today  - continue IV heparin,  ASA, BB. D/C nitro gtt after cath  - still complaining of some mild SOB when laying, however lung clear on exam.  - Risk and benefit of the cardiac cath explained to the patient include bleeding, renal/vascular injury, arrhythmia, MI or stroke during the procedure. He display clear understanding and agree to proceed.   2. Acute pulmonary edema  - responded well to diuretic, weight down from 220 to 214 lbs. Net -1.6L  - will hold IV lasix pending cath today, followup on LVEDP  3. A-fib with RVR, self converted on dilt gtt  - pending echo, metoprolol added, depend on EF, may need to change to coreg if EF =< 40%  - CHA2DS2-Vasc score 4-5 (age, HTN, DM +/- CHF)   - will need to consider bridge heparin with systemic anticoagulation after cath, appear to be a candidate for NOAC (if no CAD, will d/c ASA when transition to NOAC)  4. HTN: hold ACEI prior to cath, restart tomorrow. BP 130s-140s, will not add more BP med for now given anticipation to add ACEI 5. DM: hold metformin  Signed, Woodward Ku Pager: [phone] Plan is for cardiac catheterization today. They will be taking the patient to the cath lab soon.   Daryel November, MD

## 2014-11-24 NOTE — Progress Notes (Signed)
Echocardiogram 2D Echocardiogram has been performed.  [name] [name], [name] [name] 11/24/2014, 9:03 AM

## 2014-11-24 NOTE — Progress Notes (Signed)
AUC:  TIMI Score  Patient Information:  TIMI Score is 4   UA/NSTEMI and intermediate-risk features (e.g., TIMI score 3-4) for short-term risk of death or nonfatal MI  Revascularization of the presumed culprit artery   A (8)  Indication: 10; Score: 8

## 2014-11-24 NOTE — Progress Notes (Signed)
ANTICOAGULATION CONSULT NOTE - Follow Up Consult  Pharmacy Consult for heparin Indication: NSTEMI  Labs:  Recent Labs  11/23/14 0636 11/23/14 0734 11/23/14 0903 11/23/14 1554 11/23/14 2139 11/24/14 0347  HGB  --  14.6  --   --   --  13.4  HCT  --  45.5  --   --   --  42.5  PLT  --  159  --   --   --  188  LABPROT  --   --   --   --   --  14.2  INR  --   --   --   --   --  1.09  HEPARINUNFRC  --   --   --  0.31  --  0.37  CREATININE 1.05  --   --   --   --   --   TROPONINI 3.51*  --  2.41* 4.74* 3.91*  --      Assessment/Plan:  80yo male therapeutic on heparin after rate increase. Will continue gtt at current rate and confirm stable with additional level. Plan for cath though not yet on schedule.  Wynona Neat, PharmD, BCPS  11/24/2014,5:43 AM

## 2014-11-24 NOTE — Progress Notes (Signed)
ANTICOAGULATION CONSULT NOTE - Follow Up Consult  Pharmacy Consult for Heparin Indication: chest pain/ACS  No Known Allergies  Patient Measurements: Height: 5\' 11"  (180.3 cm) Weight: 214 lb 12.8 oz (97.433 kg) IBW/kg (Calculated) : 75.3 Heparin Dosing Weight: 95.6kg  Vital Signs: Temp: 97.7 F (36.5 C) (12/21 1353) Temp Source: Oral (12/21 1353) BP: 187/100 mmHg (12/21 1415) Pulse Rate: 84 (12/21 1415)  Labs:  Recent Labs  11/23/14 0636 11/23/14 0734 11/23/14 0903 11/23/14 1554 11/23/14 2139 11/24/14 0347  HGB  --  14.6  --   --   --  13.4  HCT  --  45.5  --   --   --  42.5  PLT  --  159  --   --   --  188  LABPROT  --   --   --   --   --  14.2  INR  --   --   --   --   --  1.09  HEPARINUNFRC  --   --   --  0.31  --  0.37  CREATININE 1.05  --   --   --   --  1.06  TROPONINI 3.51*  --  2.41* 4.74* 3.91*  --     Estimated Creatinine Clearance: 63.9 mL/min (by C-G formula based on Cr of 1.06).   Assessment: 84yom on heparin for CP/ACS. Heparin level therapeutic on 1450 units/hr prior to going to cath. A confirmatory level was not obtained as heparin was turned off on call to cath prior to obtaining the level. Patient is to resume heparin 6 hours post TR band deflation. Spoke with Dr. Irish Lack and heparin is to run overnight and then patient to start NOAC (likely Eliquis) in the morning. TR band will be finished deflating at 1600 this afternoon. No significant bleeding reported from cath.  Goal of Therapy:  Heparin level 0.3-0.7 units/ml Monitor platelets by anticoagulation protocol: Yes   Plan:  1. Resume heparin drip at 1450 units/hr (14.5 ml/hr) starting at 2000 tonight.  2. Check heparin level with AM labs along with CBC 3. Follow for NOAC orders tomorrow  Ander Purpura D. Filipe Greathouse, PharmD, BCPS Clinical Pharmacist Pager: [phone] 11/24/2014 3:44 PM

## 2014-11-24 NOTE — Interval H&P Note (Signed)
Cath Lab Visit (complete for each Cath Lab visit)  Clinical Evaluation Leading to the Procedure:   ACS: Yes.    Non-ACS:    Anginal Classification: CCS IV  Anti-ischemic medical therapy: Maximal Therapy (2 or more classes of medications)  Non-Invasive Test Results: No non-invasive testing performed  Prior CABG: No previous CABG      History and Physical Interval Note:  11/24/2014 11:53 AM  [name] [name]  has presented today for surgery, with the diagnosis of unstable angina  The various methods of treatment have been discussed with the patient and family. After consideration of risks, benefits and other options for treatment, the patient has consented to  Procedure(s): LEFT HEART CATHETERIZATION WITH CORONARY ANGIOGRAM (N/A) as a surgical intervention .  The patient's history has been reviewed, patient examined, no change in status, stable for surgery.  I have reviewed the patient's chart and labs.  Questions were answered to the patient's satisfaction.     Yumi Insalaco S.

## 2014-11-24 NOTE — Discharge Instructions (Signed)

## 2014-11-24 NOTE — Progress Notes (Signed)
TR BAND REMOVAL  LOCATION:    right radial  DEFLATED PER PROTOCOL:    Yes.    TIME BAND OFF / DRESSING APPLIED:    1730   SITE UPON ARRIVAL:    Level 0  SITE AFTER BAND REMOVAL:    Level 0  REVERSE ALLEN'S TEST:     positive  CIRCULATION SENSATION AND MOVEMENT:    Within Normal Limits   Yes.    COMMENTS:   Rechecked site at 1800 with no change in assessment, dressing dry and intact, CSMs wnls and right radial and ulnar pulses +2

## 2014-11-25 LAB — GLUCOSE, CAPILLARY
Glucose-Capillary: 124 mg/dL — ABNORMAL HIGH (ref 70–99)
Glucose-Capillary: 103 mg/dL — ABNORMAL HIGH (ref 70–99)
Glucose-Capillary: 103 mg/dL — ABNORMAL HIGH (ref 70–99)
Glucose-Capillary: 137 mg/dL — ABNORMAL HIGH (ref 70–99)

## 2014-11-25 LAB — CBC
HCT: 42.1 % (ref 39.0–52.0)
Hemoglobin: 13.3 g/dL (ref 13.0–17.0)
MCH: 29.2 pg (ref 26.0–34.0)
MCHC: 31.6 g/dL (ref 30.0–36.0)
MCV: 92.5 fL (ref 78.0–100.0)
Platelets: 172 10*3/uL (ref 150–400)
RBC: 4.55 MIL/uL (ref 4.22–5.81)
RDW: 14 % (ref 11.5–15.5)
WBC: 9.6 10*3/uL (ref 4.0–10.5)

## 2014-11-25 LAB — BASIC METABOLIC PANEL
ANION GAP: 11 (ref 5–15)
BUN: 13 mg/dL (ref 6–23)
CALCIUM: 8.9 mg/dL (ref 8.4–10.5)
CO2: 23 mmol/L (ref 19–32)
CREATININE: 1.12 mg/dL (ref 0.50–1.35)
Chloride: 103 mEq/L (ref 96–112)
GFR, EST AFRICAN AMERICAN: 69 mL/min — AB (ref >90)
GFR, EST NON AFRICAN AMERICAN: 59 mL/min — AB (ref >90)
Glucose, Bld: 160 mg/dL — ABNORMAL HIGH (ref 70–99)
Potassium: 4.4 mmol/L (ref 3.5–5.1)
Sodium: 137 mmol/L (ref 135–145)

## 2014-11-25 LAB — HEPARIN LEVEL (UNFRACTIONATED): Heparin Unfractionated: 0.19 IU/mL — ABNORMAL LOW (ref 0.30–0.70)

## 2014-11-25 MED ORDER — MAGNESIUM HYDROXIDE 400 MG/5ML PO SUSP
30.0000 mL | Freq: Every day | ORAL | Status: DC | PRN
  Administered 2014-11-25: 30 mL via ORAL
  Filled 2014-11-25: qty 30

## 2014-11-25 MED ORDER — APIXABAN 5 MG PO TABS
5.0000 mg | ORAL_TABLET | Freq: Two times a day (BID) | ORAL | Status: DC
  Administered 2014-11-25 – 2014-11-26 (×3): 5 mg via ORAL
  Filled 2014-11-25 (×4): qty 1

## 2014-11-25 MED ORDER — FUROSEMIDE 40 MG PO TABS
40.0000 mg | ORAL_TABLET | Freq: Every day | ORAL | Status: DC
  Administered 2014-11-25 – 2014-11-26 (×2): 40 mg via ORAL
  Filled 2014-11-25 (×2): qty 1

## 2014-11-25 MED FILL — Sodium Chloride IV Soln 0.9%: INTRAVENOUS | Qty: 50 | Status: AC

## 2014-11-25 NOTE — Progress Notes (Signed)
Intermittent shortness of breath this morning. Elevated SBP nitro drip 60 mcg/min will speak with MD this morning about weaning if any  changes are needed.

## 2014-11-25 NOTE — Progress Notes (Addendum)
Heart Failure Navigator Consult Note  Presentation: [name] [name]  is a 80 y.o.male with no prior cardiac history presented to ER after 2 weeks of worsening dyspnea, PND, orthopnea, and LEE. Hx of DM, Hypertension, BPH. He is normally very active, bowling, walking, fishing and has noticed over the last 2 months that his energy level has declined. Over the last two nights he was unable to sleep, awakening short of breath with coughing and congestion. Last night he had significant PND and orthopnea prompting him to come to ER. Denies chest pain, dizziness or near syncope. Wife said he was gasping for breath the am of admission.   Past Medical History  Diagnosis Date  . Hypertension   . High cholesterol   . NSTEMI (non-ST elevated myocardial infarction) 11/23/2014    "light"  . CHF (congestive heart failure)   . Type II diabetes mellitus     History   Social History  . Marital Status: Married    Spouse Name: N/A    Number of Children: N/A  . Years of Education: N/A   Social History Main Topics  . Smoking status: Never Smoker   . Smokeless tobacco: Never Used  . Alcohol Use: 8.4 oz/week    7 Not specified, 7 Shots of liquor per week  . Drug Use: No  . Sexual Activity: Not Currently   Other Topics Concern  . None   Social History Narrative    ECHO:Study Conclusions: 11/24/14  - Left ventricle: The cavity size was normal. Wall thickness was increased in a pattern of mild LVH. Systolic function was mildly to moderately reduced. The estimated ejection fraction was in the range of 40% to 45%. There is akinesis of the inferolateral, inferior, and inferoseptal myocardium. Features are consistent with a pseudonormal left ventricular filling pattern, with concomitant abnormal relaxation and increased filling pressure (grade 2 diastolic dysfunction). - Aortic valve: There was trivial regurgitation. - Mitral valve: Calcified annulus. Mildly thickened leaflets  . There was moderate regurgitation. - Left atrium: The atrium was severely dilated. - Pulmonary arteries: Systolic pressure was severely increased. PA peak pressure: 60 mm Hg (S).  Impressions:  - Akinesis of the inferior, inferolateral and inferoseptal walls; overall mild to moderate reduction in LV function; grade 2 diastolic dysfunction; severe LAE; trace AI; moderate MR; mild TR with severely elevated pulmonary pressures.  Transthoracic echocardiography. M-mode, complete 2D, spectral Doppler, and color Doppler. Birthdate: Patient birthdate: [birth year]. Age: Patient is 80 yr old. Sex: Gender: male. BMI: 29.8 kg/m^2. Blood pressure:   147/82 Patient status: Inpatient. Study date: Study date: 11/24/2014. Study time: 09:00 AM. Location: Bedside.  Cardiac Catheterization:11/24/14 IMPRESSIONS: HEMODYNAMICS: Aortic pressure was 121/70; LV pressure was 125/6; LVEDP 12. There was no gradient between the left ventricle and aorta 1. Widely patent left main coronary artery. 2. Mild to moderate disease in the left anterior descending artery and its branches. 3. 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 There are right to left and left to left collaterals which fill this large OM 2 and distal circumflex system. 4. Moderate disease in the mid right coronary artery. 5. Left ventricular systolic function was not assessed. LVEDP 12 mmHg.  RECOMMENDATION: Continue medical therapy. Should be able to start novel oral anticoagulant such as Eliquis tomorrow. If he has further symptoms of angina, would bring him back for CTO PCI of the mid circumflex. The lesion itself is favorable for PCI. I think we would have better support from a femoral  approach. He will follow-up with Dr. Meda Coffee. If symptoms persist, I can see him as well. Anticipate discharge tomorrow. BNP    Component Value Date/Time   PROBNP 2215.0* 11/23/2014 0636    Education  Assessment and Provision:  Detailed education and instructions provided on heart failure disease management including the following:  Signs and symptoms of Heart Failure When to call the physician Importance of daily weights Low sodium diet Fluid restriction Medication management Anticipated future follow-up appointments  Patient education given on each of the above topics.  Patient acknowledges understanding and acceptance of all instructions.  I spoke for a short while with Mr. Rokosz about his HF.  He seemed sleepy therefore I did not do extensive education.  He lives with his wife and prior to admission was quite active--fishing, walking and playing golf.  He does have a scale and I explained the importance of daily weights.  He loves "salty popcorn" and eats that quite regularly.  I explained a low sodium diet and high sodium foods to avoid.  He realizes that he will need to make changes regarding his diet.  He has no issues with medications and understands the importance in taking those as prescribed.  Education Materials:  "Living Better With Heart Failure" Booklet, Daily Weight Tracker Tool  High Risk Criteria for Readmission and/or Poor Patient Outcomes:   EF <30%- No 40-45% with Grad 2 dias dys  2 or more admissions in 6 months- No-new HF  Difficult social situation- No  Demonstrates medication noncompliance- No  Barriers of Care:  Knowledge of HF recommendations and compliance  Discharge Planning:   Plans to discharge to home with his wife.  He would benefit from ongoing education and compliance reinforcement with HHRN if possible.  He will follow-up with [facility] after discharge.

## 2014-11-25 NOTE — Progress Notes (Signed)
ANTICOAGULATION CONSULT NOTE - Follow Up Consult  Pharmacy Consult for heparin Indication: atrial fibrillation  Labs:  Recent Labs  11/23/14 0636  11/23/14 0734 11/23/14 0903 11/23/14 1554 11/23/14 2139 11/24/14 0347 11/25/14 0413  HGB  --   < > 14.6  --   --   --  13.4 13.3  HCT  --   --  45.5  --   --   --  42.5 42.1  PLT  --   --  159  --   --   --  188 172  LABPROT  --   --   --   --   --   --  14.2  --   INR  --   --   --   --   --   --  1.09  --   HEPARINUNFRC  --   --   --   --  0.31  --  0.37 0.19*  CREATININE 1.05  --   --   --   --   --  1.06  --   TROPONINI 3.51*  --   --  2.41* 4.74* 3.91*  --   --   < > = values in this interval not displayed.   Assessment: 80yo male subtherapeutic on heparin after resumed post-cath, plan to transition to NOAC today.  Goal of Therapy:  Heparin level 0.3-0.7 units/ml   Plan:  Will increase heparin gtt by 1-2 units/kg/hr to 1600 units/hr and check level in 8hr vs d/c for NOAC start.  Wynona Neat, PharmD, BCPS  11/25/2014,5:14 AM

## 2014-11-25 NOTE — Progress Notes (Signed)
CARDIAC REHAB PHASE I   PRE:  Rate/Rhythm: 85 SR  BP:  Supine:   Sitting: 141/73  Standing:    SaO2: 95%RA  MODE:  Ambulation: 200 ft   POST:  Rate/Rhythm: 96 SR  BP:  Supine: 153/72  Sitting:   Standing:    SaO2: 94%RA 0800-0840 Pt walked 200 ft with rolling walker and asst x 1 with steady gait. Denied CP or SOB and remained in NSR. Pt wanted to go back to bed as he is very tired and did not rest well last night. Gave pt MI and Atrial FIB booklets and began teaching. Reviewed MI restrictions, NTG use, atrial fib and reason for eliquis. Pt sleepy so did not complete all ed. Will go over CHF tomorrow and use pt's shoes as he has built up shoe which will make it easier for him to walk.    Graylon Good, RN BSN  11/25/2014 8:34 AM

## 2014-11-25 NOTE — Progress Notes (Signed)
Patient Name: [name] [name] Date of Encounter: 11/25/2014     Principal Problem:   CHF (congestive heart failure) Active Problems:   NSTEMI (non-ST elevated myocardial infarction)   Essential hypertension   Diabetes mellitus type 2 in obese   Atrial fibrillation   Acute systolic congestive heart failure    SUBJECTIVE  Had some shortness of breath after the procedure yesterday. It lasted through the night. He woke up this morning feeling better. No chest discomfort. Still has had difficult to control blood pressure.  CURRENT MEDS . aspirin EC  81 mg Oral Daily  . atorvastatin  40 mg Oral q1800  . Chlorhexidine Gluconate Cloth  6 each Topical Q0600  . furosemide  40 mg Oral Daily  . insulin aspart  0-24 Units Subcutaneous TID WC & HS  . metoprolol tartrate  12.5 mg Oral BID  . potassium chloride  20 mEq Oral Daily  . ramipril  10 mg Oral Daily    OBJECTIVE  Filed Vitals:   11/25/14 0100 11/25/14 0200 11/25/14 0400 11/25/14 0500  BP: 143/69 150/114 131/81 159/63  Pulse:    72  Temp:    97.7 F (36.5 C)  TempSrc:    Oral  Resp:    13  Height:      Weight:      SpO2:    95%    Intake/Output Summary (Last 24 hours) at 11/25/14 0644 Last data filed at 11/25/14 0555  Gross per 24 hour  Intake 937.74 ml  Output   1850 ml  Net -912.26 ml   Filed Weights   11/23/14 1214 11/24/14 0400 11/25/14 0015  Weight: 218 lb 11.2 oz (99.202 kg) 214 lb 12.8 oz (97.433 kg) 216 lb 0.8 oz (98 kg)    PHYSICAL EXAM  General: Pleasant, NAD. Neuro: Alert and oriented X 3. Moves all extremities spontaneously. Psych: Normal affect. HEENT:  Normal  Neck: Supple without bruits or JVD. Lungs:  Resp regular and unlabored, CTA. Heart: RRR no s3, s4, or murmurs. Abdomen: Soft, non-tender, non-distended, BS + x 4.  Extremities: No  edema. Right Radial 2+.  No right radial hematoma.  Accessory Clinical Findings  CBC  Recent Labs  11/23/14 0734 11/24/14 0347 11/25/14 0413  WBC  9.7 7.9 9.6  NEUTROABS 7.9*  --   --   HGB 14.6 13.4 13.3  HCT 45.5 42.5 42.1  MCV 95.0 92.0 92.5  PLT 159 188 841   Basic Metabolic Panel  Recent Labs  11/23/14 0636 11/24/14 0347  NA 139 142  K 3.9 3.6*  CL 101 100  CO2 19 27  GLUCOSE 262* 124*  BUN 14 19  CREATININE 1.05 1.06  CALCIUM 9.3 9.0   Cardiac Enzymes  Recent Labs  11/23/14 0903 11/23/14 1554 11/23/14 2139  TROPONINI 2.41* 4.74* 3.91*    Hemoglobin A1C  Recent Labs  11/23/14 1554  HGBA1C 6.4*   Fasting Lipid Panel  Recent Labs  11/23/14 0903  CHOL 173  HDL 48  LDLCALC 89  TRIG 182*  CHOLHDL 3.6   Thyroid Function Tests  Recent Labs  11/23/14 0751  TSH 5.210*    TELE  NSR with some bradycardia and freq PACs and PVCs  Radiology/Studies  Dg Chest Port 1 View  11/23/2014   CLINICAL DATA:  Acute onset of respiratory distress and shortness of breath. Audible rales and rhonchi. Initial encounter.  EXAM: PORTABLE CHEST - 1 VIEW  COMPARISON:  None.  FINDINGS: The lungs are well-aerated. Vascular congestion  is noted. Bilateral central airspace opacities raise concern for pulmonary edema. A small left pleural effusion is suspected. No pneumothorax is seen.  The cardiomediastinal silhouette is borderline enlarged. No acute osseous abnormalities are seen.  IMPRESSION: Vascular congestion and borderline cardiomegaly. Bilateral central airspace opacities raise concern for pulmonary edema. Suspect small left pleural effusion.   Electronically Signed   By: Garald Balding M.D.   On: 11/23/2014 06:56    ASSESSMENT AND PLAN  [name] [name] is a 80 y.o. male with a history of DM, HTN and BPH who presented to the [facility] ED on 11/23/14 with 2 weeks of worsening dyspnea, PND, orthopnea, and LEE. He was found to be in atrial fib with RVR and have NSTEMI.   NSTEMI- pk troponin 4.74 - s/p LHC on 11/24/14 with  1. Widely patent left main coronary artery. 2. Mild to moderate disease in the left  anterior descending artery and its branches. 3. 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 There are right to left and left to left collaterals which fill this large OM 2 and distal circumflex system. 4. Moderate disease in the mid right coronary artery. 5. Left ventricular systolic function was not assessed. LVEDP 12 mmHg. -- Continue ASA, statin, BB -- R radial site stable   Acute pulmonary edema --Responded well to diuretic, weight down from 220 to 214 lbs. Net neg 2.7L -- Lasix held yesterday for cath. LVEDP 12. Started on lasix 40mg  po qd today   A-fib with RVR, self converted on dilt gtt -- CHA2DS2-Vasc score 4-5 (age, HTN, DM, CHF).  -- Will d/c heparin gtt. Will start on Eliquis today. Continue ASA 81mg    HTN: having trouble weaning him off NTG gtt due to high BPs -- Will resume home Altace 10mg . Continue lopressor 12.5 mg BID -- Continue to monitor today  DM: hold metformin >48 hours after cardiac cath  Consider D/C tomorrow.  Signed,  Jettie Booze, MD

## 2014-11-26 ENCOUNTER — Encounter (HOSPITAL_COMMUNITY): Payer: Self-pay | Admitting: Physician Assistant

## 2014-11-26 ENCOUNTER — Telehealth: Payer: Self-pay | Admitting: Cardiology

## 2014-11-26 DIAGNOSIS — J810 Acute pulmonary edema: Secondary | ICD-10-CM

## 2014-11-26 DIAGNOSIS — I2510 Atherosclerotic heart disease of native coronary artery without angina pectoris: Secondary | ICD-10-CM | POA: Diagnosis present

## 2014-11-26 DIAGNOSIS — I480 Paroxysmal atrial fibrillation: Secondary | ICD-10-CM | POA: Diagnosis present

## 2014-11-26 DIAGNOSIS — I5022 Chronic systolic (congestive) heart failure: Secondary | ICD-10-CM | POA: Diagnosis present

## 2014-11-26 LAB — GLUCOSE, CAPILLARY: Glucose-Capillary: 115 mg/dL — ABNORMAL HIGH (ref 70–99)

## 2014-11-26 MED ORDER — FUROSEMIDE 40 MG PO TABS: 40.0000 mg | ORAL_TABLET | Freq: Every day | ORAL | Status: DC

## 2014-11-26 MED ORDER — APIXABAN 5 MG PO TABS: 5.0000 mg | ORAL_TABLET | Freq: Two times a day (BID) | ORAL | Status: DC

## 2014-11-26 MED ORDER — METFORMIN HCL 1000 MG PO TABS: 1000.0000 mg | ORAL_TABLET | Freq: Two times a day (BID) | ORAL | Status: DC

## 2014-11-26 MED ORDER — ATORVASTATIN CALCIUM 40 MG PO TABS: 40.0000 mg | ORAL_TABLET | Freq: Every day | ORAL | Status: DC

## 2014-11-26 MED ORDER — ISOSORBIDE MONONITRATE ER 30 MG PO TB24
30.0000 mg | ORAL_TABLET | Freq: Every day | ORAL | Status: DC
  Filled 2014-11-26 (×2): qty 1

## 2014-11-26 MED ORDER — METOPROLOL TARTRATE 25 MG PO TABS: 25.0000 mg | ORAL_TABLET | Freq: Two times a day (BID) | ORAL | Status: DC

## 2014-11-26 MED ORDER — METOPROLOL TARTRATE 25 MG PO TABS
25.0000 mg | ORAL_TABLET | Freq: Two times a day (BID) | ORAL | Status: DC
  Administered 2014-11-26: 25 mg via ORAL
  Filled 2014-11-26: qty 1

## 2014-11-26 MED ORDER — POTASSIUM CHLORIDE CRYS ER 20 MEQ PO TBCR: 20.0000 meq | EXTENDED_RELEASE_TABLET | Freq: Every day | ORAL | Status: DC

## 2014-11-26 MED ORDER — MORPHINE SULFATE 2 MG/ML IJ SOLN
2.0000 mg | INTRAMUSCULAR | Status: DC | PRN
  Administered 2014-11-26: 2 mg via INTRAVENOUS
  Filled 2014-11-26: qty 1

## 2014-11-26 MED ORDER — NITROGLYCERIN 0.4 MG SL SUBL: 0.4000 mg | SUBLINGUAL_TABLET | SUBLINGUAL | Status: DC | PRN

## 2014-11-26 NOTE — Discharge Summary (Signed)
Discharge Summary   Patient ID: [name] [name] MRN: [id number], DOB/AGE: 78/13/1931 78 y.o. Admit date: 11/23/2014 D/C date:     11/26/2014  Primary Cardiologist: Dr. Meda Coffee  Principal Problem:   Paroxysmal atrial fibrillation Active Problems:   NSTEMI (non-ST elevated myocardial infarction)   Essential hypertension   Diabetes mellitus type 2 in obese   Chronic systolic CHF (congestive heart failure)   HLD (hyperlipidemia)   Acute pulmonary edema   CAD (coronary artery disease)    Admission Dates: 11/23/14-11/26/14 Discharge Diagnosis: New onset atrial fibrillation with RVR and NSTEMI  HPI: [name] [name] is a 80 y.o. male with a history of DM, HTN and BPH who presented to the [facility] ED on 11/23/14 with 2 weeks of worsening dyspnea, PND, orthopnea, and LEE. He was found to be in new onset atrial fib with RVR, acute pulmonary edema and have NSTEMI.    Hospital Course  NSTEMI- pk troponin 4.74 -- s/p LHC on 11/24/14 with  1. Widely patent left main coronary artery. 2. Mild to moderate disease in the left anterior descending artery and its branches. 3. 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 There are right to left and left to left collaterals which fill this large OM 2 and distal circumflex system. 4. Moderate disease in the mid right coronary artery. 5. Left ventricular systolic function was not assessed. LVEDP 12 mmHg. -- Continue ASA, statin, BB. If he has further symptoms of angina, would bring him back for CTO PCI of the mid circumflex. The lesion itself is favorable for PCI. A femoral approach is reccommended.   -- The patient did have some recurrent chest pain after his LHC. I have advised him to keep a close eye on this and report to the provider he will see next week in follow up -- R radial site stable   A-fib with RVR, self converted on dilt gtt. Still maintaining sinus.  -- CHA2DS2-Vasc score 4-5 (age, HTN, DM,  CHF).  -- Started on Eliquis.   Acute pulmonary edema/ acute on chronic systolic CHF -- 2d ECHO 08/65/78 with EF 40-45%, akinesis of the inferior, inferolateral and inferoseptal walls; overall mild to moderate reduction in LV function; grade 2 diastolic dysfunction; severe LAE; trace AI; moderate MR; mild TR with severely elevated pulmonary pressures. -- Responded well to diuretic, weight down from 220 to 217 lbs. Net neg 3.7L -- Lasix held for cath but he was started on lasix 40mg  po qd yesterday -- BMET next week.  HTN: there was some trouble weaning him off NTG gtt due to high BPs and chest pain  -- He was resumed on home Altace 10mg  and lopressor was increased from 12.5 mg to 25mg . He is now off NTG gtt and doing well.   DM: hold metformin >48 hours after cardiac cath. He can start this back 11/27/14  HLD-continue statin   The patient has had an uncomplicated hospital course and is recovering well. The radial catheter site is stable. He has been seen by Dr. Ron Parker today and deemed ready for discharge home. All follow-up appointments have been scheduled. Discharge medications are listed below.   Discharge Vitals: Blood pressure 156/64, pulse 87, temperature 98 F (36.7 C), temperature source Oral, resp. rate 16, height 5\' 11"  (1.803 m), weight 217 lb 2.5 oz (98.5 kg), SpO2 94 %.  Labs: Lab Results  Component Value Date   WBC 9.6 11/25/2014   HGB 13.3 11/25/2014   HCT 42.1  11/25/2014   MCV 92.5 11/25/2014   PLT 172 11/25/2014     Recent Labs Lab 11/25/14 0755  NA 137  K 4.4  CL 103  CO2 23  BUN 13  CREATININE 1.12  CALCIUM 8.9  GLUCOSE 160*    Recent Labs  11/23/14 1554 11/23/14 2139  TROPONINI 4.74* 3.91*   Lab Results  Component Value Date   CHOL 173 11/23/2014   HDL 48 11/23/2014   LDLCALC 89 11/23/2014   TRIG 182* 11/23/2014     Diagnostic Studies/Procedures   Dg Chest Port 1 View  11/23/2014   CLINICAL DATA:  Acute onset of  respiratory distress and shortness of breath. Audible rales and rhonchi. Initial encounter.  EXAM: PORTABLE CHEST - 1 VIEW  COMPARISON:  None.  FINDINGS: The lungs are well-aerated. Vascular congestion is noted. Bilateral central airspace opacities raise concern for pulmonary edema. A small left pleural effusion is suspected. No pneumothorax is seen.  The cardiomediastinal silhouette is borderline enlarged. No acute osseous abnormalities are seen.  IMPRESSION: Vascular congestion and borderline cardiomegaly. Bilateral central airspace opacities raise concern for pulmonary edema. Suspect small left pleural effusion.   Electronically Signed   By: Garald Balding M.D.   On: 11/23/2014 06:56   Study Date: 11/24/2014 LV EF: 40% -  45% Study Conclusions - Left ventricle: The cavity size was normal. Wall thickness was increased in a pattern of mild LVH. Systolic function was mildly to moderately reduced. The estimated ejection fraction was in the range of 40% to 45%. There is akinesis of the inferolateral, inferior, and inferoseptal myocardium. Features are consistent with a pseudonormal left ventricular filling pattern, with concomitant abnormal relaxation and increased filling pressure (grade 2 diastolic dysfunction). - Aortic valve: There was trivial regurgitation. - Mitral valve: Calcified annulus. Mildly thickened leaflets . There was moderate regurgitation. - Left atrium: The atrium was severely dilated. - Pulmonary arteries: Systolic pressure was severely increased. PA peak pressure: 60 mm Hg (S). Impressions: - Akinesis of the inferior, inferolateral and inferoseptal walls; overall mild to moderate reduction in LV function; grade 2 diastolic dysfunction; severe LAE; trace AI; moderate MR; mild TR with severely elevated pulmonary pressures.     11/24/14 IMPRESSIONS: 6. Widely patent left main coronary artery. 7. Mild to moderate disease in the left anterior  descending artery and its branches. 8. 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 There are right to left and left to left collaterals which fill this large OM 2 and distal circumflex system. 9. Moderate disease in the mid right coronary artery. 10. Left ventricular systolic function was not assessed. LVEDP 12 mmHg. RECOMMENDATION: Continue medical therapy. Should be able to start novel oral anticoagulant such as Eliquis tomorrow. If he has further symptoms of angina, would bring him back for CTO PCI of the mid circumflex. The lesion itself is favorable for PCI. I think we would have better support from a femoral approach. He will follow-up with Dr. Meda Coffee. If symptoms persist, I can see him as well. Anticipate discharge tomorrow.     Discharge Medications     Medication List    TAKE these medications        apixaban 5 MG Tabs tablet  Commonly known as:  ELIQUIS  Take 1 tablet (5 mg total) by mouth 2 (two) times daily.     aspirin EC 81 MG tablet  Take 81 mg by mouth daily.     atorvastatin 40 MG tablet  Commonly  known as:  LIPITOR  Take 1 tablet (40 mg total) by mouth daily at 6 PM.     furosemide 40 MG tablet  Commonly known as:  LASIX  Take 1 tablet (40 mg total) by mouth daily.     metFORMIN 1000 MG tablet  Commonly known as:  GLUCOPHAGE  Take 1 tablet (1,000 mg total) by mouth 2 (two) times daily with a meal.  Start taking on:  11/27/2014     metoprolol tartrate 25 MG tablet  Commonly known as:  LOPRESSOR  Take 1 tablet (25 mg total) by mouth 2 (two) times daily.     nitroGLYCERIN 0.4 MG SL tablet  Commonly known as:  NITROSTAT  Place 1 tablet (0.4 mg total) under the tongue every 5 (five) minutes x 3 doses as needed for chest pain.     potassium chloride SA 20 MEQ tablet  Commonly known as:  K-DUR,KLOR-CON  Take 1 tablet (20 mEq total) by mouth daily.     ramipril 10 MG capsule  Commonly known as:  ALTACE  Take 10 mg by mouth  daily.     tamsulosin 0.4 MG Caps capsule  Commonly known as:  FLOMAX  Take 0.4 mg by mouth daily as needed (BPH).        Disposition   The patient will be discharged in stable condition to home.  Follow-up Information    Follow up with Truitt Merle, NP On 12/03/2014.   Specialty:  Nurse Practitioner   Why:  @ 9 am TOC appointment,   Contact information:   [place] [place] [address] [phone]         Duration of Discharge Encounter: Greater than 30 minutes including physician and PA time.  SignedAngelena Form R PA-C 11/26/2014, 10:01 AM Patient seen and examined. I agree with the assessment and plan as detailed above. See also my additional thoughts below.   The patient has had some recurrent very slight chest discomfort. I feel that this is not ischemic in origin. He shared with me the fact that he is very anxious because his brother died of coronary disease. I tried to reassure him that we thought he was stable and that the very slight discomfort that he was having was not from his heart.  Dola Argyle, MD, [facility] 11/26/2014 11:53 AM

## 2014-11-26 NOTE — Telephone Encounter (Signed)
Patient contacted regarding discharge from [facility] today.  Patient understands to follow up with provider Truitt Merle NP on 12/03/2014 at 9:00 am. Patient understands discharge instructionsyesPatient understands medications and regiment?yes Patient understands to bring all medications to this visit? yes

## 2014-11-26 NOTE — Progress Notes (Signed)
Patient discharged home with family. Instructions given and reviewed with patient.  

## 2014-11-26 NOTE — Progress Notes (Signed)
Continued to wean Ntg drip got down to 34mcg/min patient c/o of left sternal chest pain 4/10 Ntg drip titrated back up to 28mcg/min. Notified MD updated of CP and  requested morphine to give. EKG done BP 161/64  NSR with PVC's.     I will continue to monitor.

## 2014-11-26 NOTE — Telephone Encounter (Signed)
New message     TCM appt on  12/29 @ 9:00 am with Cecille Rubin G.per Katie T. PA.

## 2014-11-26 NOTE — Progress Notes (Signed)
Patient Name: [name] [name] Date of Encounter: 11/26/2014     Principal Problem:   CHF (congestive heart failure) Active Problems:   NSTEMI (non-ST elevated myocardial infarction)   Essential hypertension   Diabetes mellitus type 2 in obese   Atrial fibrillation   Acute systolic congestive heart failure    SUBJECTIVE Has some mild chest discomfort and anxiety but doing a little better,   CURRENT MEDS . apixaban  5 mg Oral BID  . aspirin EC  81 mg Oral Daily  . atorvastatin  40 mg Oral q1800  . furosemide  40 mg Oral Daily  . insulin aspart  0-24 Units Subcutaneous TID WC & HS  . metoprolol tartrate  12.5 mg Oral BID  . potassium chloride  20 mEq Oral Daily  . ramipril  10 mg Oral Daily    OBJECTIVE  Filed Vitals:   11/25/14 2100 11/26/14 0000 11/26/14 0404 11/26/14 0600  BP: 165/73 132/56 161/64 132/77  Pulse: 85 76 74 70  Temp: 98.1 F (36.7 C) 98.1 F (36.7 C) 98 F (36.7 C)   TempSrc: Oral Oral Oral   Resp: 15 17 17    Height:      Weight:  217 lb 2.5 oz (98.5 kg)    SpO2: 96% 95% 93% 95%    Intake/Output Summary (Last 24 hours) at 11/26/14 0644 Last data filed at 11/26/14 0321  Gross per 24 hour  Intake 902.92 ml  Output   2300 ml  Net -1397.08 ml   Filed Weights   11/24/14 0400 11/25/14 0015 11/26/14 0000  Weight: 214 lb 12.8 oz (97.433 kg) 216 lb 0.8 oz (98 kg) 217 lb 2.5 oz (98.5 kg)    PHYSICAL EXAM  General: Pleasant, NAD. Neuro: Alert and oriented X 3. Moves all extremities spontaneously. Psych: Normal affect. HEENT:  Normal  Neck: Supple without bruits or JVD. Lungs:  Resp regular and unlabored, CTA. Heart: RRR no s3, s4, or murmurs. Abdomen: Soft, non-tender, non-distended, BS + x 4.  Extremities: No  edema. Right Radial 2+.  No right radial hematoma.  Accessory Clinical Findings  CBC  Recent Labs  11/23/14 0734 11/24/14 0347 11/25/14 0413  WBC 9.7 7.9 9.6  NEUTROABS 7.9*  --   --   HGB 14.6 13.4 13.3  HCT 45.5 42.5 42.1   MCV 95.0 92.0 92.5  PLT 159 188 093   Basic Metabolic Panel  Recent Labs  11/24/14 0347 11/25/14 0755  NA 142 137  K 3.6* 4.4  CL 100 103  CO2 27 23  GLUCOSE 124* 160*  BUN 19 13  CREATININE 1.06 1.12  CALCIUM 9.0 8.9   Cardiac Enzymes  Recent Labs  11/23/14 0903 11/23/14 1554 11/23/14 2139  TROPONINI 2.41* 4.74* 3.91*    Hemoglobin A1C  Recent Labs  11/23/14 1554  HGBA1C 6.4*   Fasting Lipid Panel  Recent Labs  11/23/14 0903  CHOL 173  HDL 48  LDLCALC 89  TRIG 182*  CHOLHDL 3.6   Thyroid Function Tests  Recent Labs  11/23/14 0751  TSH 5.210*    TELE  NSR with one 3 beat run of NSVT  Radiology/Studies  Dg Chest Port 1 View  11/23/2014   CLINICAL DATA:  Acute onset of respiratory distress and shortness of breath. Audible rales and rhonchi. Initial encounter.  EXAM: PORTABLE CHEST - 1 VIEW  COMPARISON:  None.  FINDINGS: The lungs are well-aerated. Vascular congestion is noted. Bilateral central airspace opacities raise concern for pulmonary edema. A  small left pleural effusion is suspected. No pneumothorax is seen.  The cardiomediastinal silhouette is borderline enlarged. No acute osseous abnormalities are seen.  IMPRESSION: Vascular congestion and borderline cardiomegaly. Bilateral central airspace opacities raise concern for pulmonary edema. Suspect small left pleural effusion.   Electronically Signed   By: Garald Balding M.D.   On: 11/23/2014 06:56    ASSESSMENT AND PLAN  [name] [name] is a 80 y.o. male with a history of DM, HTN and BPH who presented to the [facility] ED on 11/23/14 with 2 weeks of worsening dyspnea, PND, orthopnea, and LEE. He was found to be in atrial fib with RVR and have NSTEMI.   NSTEMI- pk troponin 4.74 - s/p LHC on 11/24/14 with  1. Widely patent left main coronary artery. 2. Mild to moderate disease in the left anterior descending artery and its branches. 3. Chronic total occlusion of the mid left circumflex  artery and OM 2 branch. There are right to left and left to left collaterals which fill this large OM 2 and distal circumflex system. 4. Moderate disease in the mid right coronary artery. 5. Left ventricular systolic function was not assessed. LVEDP 12 mmHg. -- Continue ASA, statin, BB -- R radial site stable   Acute pulmonary edema -- Responded well to diuretic, weight down from 220 to 214 lbs. Net neg 2.7L -- Lasix held yesterday for cath. LVEDP 12. Started on lasix 40mg  po qd today   A-fib with RVR, self converted on dilt gtt. Still maintaining sinus.  -- CHA2DS2-Vasc score 4-5 (age, HTN, DM, CHF).  -- Will d/c heparin gtt. Will start on Eliquis today. Continue ASA 81mg    HTN: having trouble weaning him off NTG gtt due to high BPs and chest pain  -- Continue home Altace 10mg . Continue lopressor 12.5 mg to 25mg  this AM and added 30 mg imdur.  DM: hold metformin >48 hours after cardiac cath   Signed,  Eileen Stanford, PA-C Patient seen and examined. I agree with the assessment and plan as detailed above. See also my additional thoughts below.   The patient is quite anxious about his heart. I told him that the minor pains he is having are not from his heart. He is stable and ready to go home. Dola Argyle, MD, [facility] 11/26/2014 8:08 AM

## 2014-11-26 NOTE — Progress Notes (Signed)
CARDIAC REHAB PHASE I   PRE:  Rate/Rhythm: 85 SR  BP:  Supine: 156/64  Sitting:   Standing:    SaO2: 93%RA  MODE:  Ambulation: 500 ft   POST:  Rate/Rhythm: 102 ST  BP:  Supine:   Sitting: 171/69  Standing:    SaO2: 94%RA [id number] Pt walked better today even though he did not want to put shoes on. Walked 500 ft on RA with rolling walker and no complaints. Reviewed CHF yellow zone of when to call MD and daily weights and sodium restriction. Gave ex ed . Pt did not want referral to CRP 2. Reviewed NTG use and MI restrictions. Discussed foods high in sodium and carbs. Pt voiced understanding. To recliner with call bell.   Graylon Good, RN BSN  11/26/2014 8:49 AM

## 2014-12-03 ENCOUNTER — Ambulatory Visit (INDEPENDENT_AMBULATORY_CARE_PROVIDER_SITE_OTHER): Payer: Medicare HMO | Admitting: Nurse Practitioner

## 2014-12-03 ENCOUNTER — Encounter: Payer: Self-pay | Admitting: Nurse Practitioner

## 2014-12-03 VITALS — BP 150/80 | HR 60 | Ht 71.0 in | Wt 218.0 lb

## 2014-12-03 DIAGNOSIS — I214 Non-ST elevation (NSTEMI) myocardial infarction: Secondary | ICD-10-CM

## 2014-12-03 DIAGNOSIS — I5022 Chronic systolic (congestive) heart failure: Secondary | ICD-10-CM

## 2014-12-03 LAB — BASIC METABOLIC PANEL
BUN: 21 mg/dL (ref 6–23)
CO2: 27 mEq/L (ref 19–32)
Calcium: 9.7 mg/dL (ref 8.4–10.5)
Chloride: 100 mEq/L (ref 96–112)
Creatinine, Ser: 1.4 mg/dL (ref 0.4–1.5)
GFR: 53.74 mL/min — ABNORMAL LOW (ref >60.00)
Glucose, Bld: 120 mg/dL — ABNORMAL HIGH (ref 70–99)
Potassium: 4.7 mEq/L (ref 3.5–5.1)
Sodium: 137 mEq/L (ref 135–145)

## 2014-12-03 LAB — CBC
HCT: 44.7 % (ref 39.0–52.0)
Hemoglobin: 14.5 g/dL (ref 13.0–17.0)
MCHC: 32.5 g/dL (ref 30.0–36.0)
MCV: 90.9 fl (ref 78.0–100.0)
Platelets: 254 10*3/uL (ref 150.0–400.0)
RBC: 4.92 Mil/uL (ref 4.22–5.81)
RDW: 14.2 % (ref 11.5–15.5)
WBC: 9.2 10*3/uL (ref 4.0–10.5)

## 2014-12-03 NOTE — Patient Instructions (Addendum)
We will be checking the following labs today BMET and CBC  Stay on your current medicines  See Dr. Meda Coffee in one month with fasting labs  Check to see if your insurance will cover Xarelto or Pradaxa  Let us know if you have ANY chest pain/discomfort  Call the [place] office at [phone] if you have any questions, problems or concerns.

## 2014-12-03 NOTE — Progress Notes (Signed)
[name] Date of Birth: [birth year] Medical Record #[id number]  History of Present Illness: Mr. [name] [name] is seen back today for a post hospital/TOC visit. Seen for Dr. Meda Coffee. He is an 80 year old male with DM, HTN and BPH.  Presented to [facility] just before Christmas with a 2 week history of DOE, PND, orthopnea and edema - found to be in AF with RVR and had + enzymes - consistent with NSTEMI. Noted to have mild to moderate disease in the LAD, chronic total occlusion of mid LCX and OM 2 brach with right to left and left to collaterals, moderate disease in the RCA. He will be managed medically but CTO PCI of the mid LCX could be entertained if he were to have more symptoms of angina. He converted to NSR with Diltiazem gtt. CHADSVASC of 4-5 - started on eliquis. EF 40 to 45% per echo with severe LAE. BP treated with increased doses of beta blocker.   Noted to have had some chest pain post cath - was not felt to be cardiac - he endorsed anxiety due to a brother passing with CAD.  Comes in today. Here with wife and grandson. Doing ok. He is pretty flat with his affect. Wife says "he never complains". Denies chest pain. Says he is not short of breath. Feels like everything is fine. Says someone told him his heart "was just fine". He wants to stop taking some of the medicines. Does not check BP at home. He is pretty active but no regular exercise. No bleeding or bruising. Expense long term for Eliquis may be an issue.    Current Outpatient Prescriptions  Medication Sig Dispense Refill  . apixaban (ELIQUIS) 5 MG TABS tablet Take 1 tablet (5 mg total) by mouth 2 (two) times daily. 60 tablet 12  . aspirin EC 81 MG tablet Take 81 mg by mouth daily.    Marland Kitchen atorvastatin (LIPITOR) 40 MG tablet Take 1 tablet (40 mg total) by mouth daily at 6 PM. 30 tablet 11  . furosemide (LASIX) 40 MG tablet Take 1 tablet (40 mg total) by mouth daily. 30 tablet 11  . metFORMIN (GLUCOPHAGE) 1000 MG tablet Take 1 tablet (1,000 mg  total) by mouth 2 (two) times daily with a meal. 60 tablet 11  . metoprolol tartrate (LOPRESSOR) 25 MG tablet Take 1 tablet (25 mg total) by mouth 2 (two) times daily. 60 tablet 11  . nitroGLYCERIN (NITROSTAT) 0.4 MG SL tablet Place 1 tablet (0.4 mg total) under the tongue every 5 (five) minutes x 3 doses as needed for chest pain. 25 tablet 12  . potassium chloride SA (K-DUR,KLOR-CON) 20 MEQ tablet Take 1 tablet (20 mEq total) by mouth daily. 30 tablet 11  . ramipril (ALTACE) 10 MG capsule Take 10 mg by mouth daily.    . tamsulosin (FLOMAX) 0.4 MG CAPS capsule Take 0.4 mg by mouth daily as needed (BPH).     No current facility-administered medications for this visit.    No Known Allergies  Past Medical History  Diagnosis Date  . Hypertension   . HLD (hyperlipidemia)   . NSTEMI (non-ST elevated myocardial infarction)   . Type II diabetes mellitus   . Paroxysmal atrial fibrillation     a. diagnosed on 11/2014 admission. Spontaneously converted to NSR. Placed on Eliquis  . Chronic systolic CHF (congestive heart failure)     a. 2d ECHO 11/24/14 with EF 40-45%, akinesis of the inferior, inferolateral and inferoseptal walls; overall mild to moderate  reduction in LV function; grade 2 diastolic dysfunction; severe LAE; trace AI; moderate MR; mild TR with severely elevated pulmonary pressures.  Marland Kitchen CAD (coronary artery disease)     a. s/p LHC on 11/24/14 w/ mild-mod dz in LAD. CTO of mLCx and OM2 with R-->L and L-->L collaterals.     Past Surgical History  Procedure Laterality Date  . Appendectomy    . Total knee arthroplasty Left 2013  . Tonsillectomy    . Joint replacement    . Cardiac catheterization  11/24/2014  . Left heart catheterization with coronary angiogram N/A 11/24/2014    Procedure: LEFT HEART CATHETERIZATION WITH CORONARY ANGIOGRAM;  Surgeon: Jettie Booze, MD;  Location: [facility] CATH LAB;  Service: Cardiovascular;  Laterality: N/A;    History  Smoking status  . Never  Smoker   Smokeless tobacco  . Never Used    History  Alcohol Use  . 8.4 oz/week  . 7 Not specified, 7 Shots of liquor per week    Family History  Problem Relation Age of Onset  . Cancer Father 65    Deceased  . Stroke Mother     Deceased  . Heart attack Brother     Deceased. Had cath with stents followed by PE    Review of Systems: The review of systems is per the HPI.  All other systems were reviewed and are negative.  Physical Exam: BP 150/80 mmHg  Pulse 60  Ht 5\' 11"  (1.803 m)  Wt 218 lb (98.884 kg)  BMI 30.42 kg/m2  Recheck of his BP by me is 130/60. Patient is alert and in no acute distress. His affect is pretty flat. Skin is warm and dry. Color is normal.  HEENT is unremarkable. Normocephalic/atraumatic. PERRL. Sclera are nonicteric. Neck is supple. No masses. No JVD. Lungs are clear. Cardiac exam shows a regular rate and rhythm. Abdomen is soft. Extremities are without edema. Gait and ROM are intact. No gross neurologic deficits noted.  Wt Readings from Last 3 Encounters:  12/03/14 218 lb (98.884 kg)  11/26/14 217 lb 2.5 oz (98.5 kg)    LABORATORY DATA/PROCEDURES: EKG today shows NSR - has lateral T wave changes which are new - tracing reviewed with Dr. Acie Fredrickson.   Lab Results  Component Value Date   WBC 9.6 11/25/2014   HGB 13.3 11/25/2014   HCT 42.1 11/25/2014   PLT 172 11/25/2014   GLUCOSE 160* 11/25/2014   CHOL 173 11/23/2014   TRIG 182* 11/23/2014   HDL 48 11/23/2014   LDLCALC 89 11/23/2014   NA 137 11/25/2014   K 4.4 11/25/2014   CL 103 11/25/2014   CREATININE 1.12 11/25/2014   BUN 13 11/25/2014   CO2 23 11/25/2014   TSH 5.210* 11/23/2014   INR 1.09 11/24/2014   HGBA1C 6.4* 11/23/2014   Lab Results  Component Value Date   TROPONINI 3.91* 11/23/2014    BNP (last 3 results)  Recent Labs  11/23/14 0636  PROBNP 2215.0*   Echo Study Conclusions from 11/2014  - Left ventricle: The cavity size was normal. Wall thickness was increased  in a pattern of mild LVH. Systolic function was mildly to moderately reduced. The estimated ejection fraction was in the range of 40% to 45%. There is akinesis of the inferolateral, inferior, and inferoseptal myocardium. Features are consistent with a pseudonormal left ventricular filling pattern, with concomitant abnormal relaxation and increased filling pressure (grade 2 diastolic dysfunction). - Aortic valve: There was trivial regurgitation. - Mitral valve: Calcified annulus.  Mildly thickened leaflets . There was moderate regurgitation. - Left atrium: The atrium was severely dilated. - Pulmonary arteries: Systolic pressure was severely increased. PA peak pressure: 60 mm Hg (S).  Impressions:  - Akinesis of the inferior, inferolateral and inferoseptal walls; overall mild to moderate reduction in LV function; grade 2 diastolic dysfunction; severe LAE; trace AI; moderate MR; mild TR with severely elevated pulmonary pressures.   PROCEDURE: Left heart catheterization with selective coronary angiography, attempted PCI of the mid circumflex.  INDICATIONS: Non-STEMI  The risks, benefits, and details of the procedure were explained to the patient. The patient verbalized understanding and wanted to proceed. Informed written consent was obtained.  PROCEDURE TECHNIQUE: After Xylocaine anesthesia a 37F slender sheath was placed in the right radial artery with a single anterior needle wall stick. IV Heparin was given. Right coronary angiography was done using a Judkins R4 guide catheter. Left coronary angiography was done using a Judkins L3.5 guide catheter. Left heart catheterization was done using a pigtail catheter. The intervention was performed. Please see below for details. A TR band was used for hemostasis.  CONTRAST: Total of 1:30 cc.  COMPLICATIONS: None.   HEMODYNAMICS: Aortic pressure was 121/70; LV pressure was 125/6; LVEDP 12. There was no  gradient between the left ventricle and aorta.   ANGIOGRAPHIC DATA: The left main coronary artery is widely patent. There is mild distal disease.  The left anterior descending artery is a large vessel proximally. In the mid vessel, there is moderate diffuse disease. There is a small first diagonal which is patent. The second diagonal is large and widely patent. The remainder of the LAD is patent.  The left circumflex artery is a large vessel proximally. There is a large first obtuse marginal which has mild disease. After the first obtuse marginal, the circumflex is occluded. This leads for the most part into a large OM 2. The OM 2 fills by collaterals from the RCA as well as from the LAD. The proximal portion of the OM 2 appears to be diseased.  The right coronary artery is a large, dominant vessel. There is moderate, diffuse mid vessel disease. The posterior lateral artery gives epicardial collaterals to the distal circumflex territory. The posterior descending artery gives collaterals to the large OM1.  LEFT VENTRICULOGRAM: Left ventricular angiogram was not done. LVEDP was 12 mmHg.  PCI NARRATIVE: IV Angiomax was given. A CLS 3.0 guide catheter was used to engage the left main. A CT was used to check that the Angiomax was therapeutic. A pro-water wire was advanced to the ostium of the OM 2/mid circumflex occlusion. This wire would not cross. It was left in the true circumflex. A fielder XT was then advanced to the occlusion but this would not cross. A miracle brothers 3 g wire was then advanced and also would not cross. The miracle 3 g wire was removed and placed into an over-the-wire balloon for additional support. Even with this combination, the miracle brothers wire would not cross. We switched out the 3 g wire for a 6 g wire. This 6 g wire with support from a balloon would also not cross. At that point, final angiography was performed. There is no dissection. There is no dye staining. We  stopped the procedure at that time to avoid any complications.   IMPRESSIONS:  1. Widely patent left main coronary artery. 2. Mild to moderate disease in the left anterior descending artery and its branches. 3. Chronic total occlusion of the mid left circumflex  artery and OM 2 branch. There are right to left and left to left collaterals which fill this large OM 2 and distal circumflex system. 4. Moderate disease in the mid right coronary artery. 5. Left ventricular systolic function was not assessed. LVEDP 12 mmHg.  RECOMMENDATION: Continue medical therapy. Should be able to start novel oral anticoagulant such as Eliquis tomorrow. If he has further symptoms of angina, would bring him back for CTO PCI of the mid circumflex. The lesion itself is favorable for PCI. I think we would have better support from a femoral approach. He will follow-up with Dr. Meda Coffee. If symptoms persist, I can see him as well. Anticipate discharge tomorrow.  LVEDP was normal. Will hold Lasix today. He would likely need to go home on a low dose of oral Lasix.        Assessment / Plan: 1. NSTEMI - mild to moderate disease noted by cath with CTO of the LCX - doing ok clinically but seems pretty sedate/flat in his affect. He is to let us know if he has chest pain. Would continue with his current regimen. Discussed with Dr. Acie Fredrickson as well who is in agreement. Recheck EKG on return.  2. AF with RVR - in sinus today.   3. Chronic anticoagulation on Eliquis - no bleeding or bruising - recheck CBC and BMET today. They will look in to the cost of xarelto or Pradaxa. May have to transition to coumadin.  4. HTN -  Recheck BP by me is 130/60. No change with current regimen.   5. Severely elevated PA pressures - not short of breath - this may need to be addressed in the future.  6. LV dysfunction - asymptomatic. On beta blocker and ACE. Recheck echo in 3 months post MI.  See back in a month with fasting lab and EKG.  No change with his current regimen. Consider repeat echo in 3 months.   Patient is agreeable to this plan and will call if any problems develop in the interim.   Burtis Junes, RN, [facility] [address] [place], [place]  [id number] [phone]

## 2014-12-25 DIAGNOSIS — I214 Non-ST elevation (NSTEMI) myocardial infarction: Secondary | ICD-10-CM

## 2015-01-07 ENCOUNTER — Encounter: Payer: Self-pay | Admitting: Cardiology

## 2015-01-07 ENCOUNTER — Ambulatory Visit (INDEPENDENT_AMBULATORY_CARE_PROVIDER_SITE_OTHER): Payer: Medicare HMO | Admitting: Cardiology

## 2015-01-07 VITALS — BP 132/72 | HR 56 | Ht 71.0 in | Wt 221.0 lb

## 2015-01-07 DIAGNOSIS — E785 Hyperlipidemia, unspecified: Secondary | ICD-10-CM

## 2015-01-07 DIAGNOSIS — I5022 Chronic systolic (congestive) heart failure: Secondary | ICD-10-CM

## 2015-01-07 DIAGNOSIS — I10 Essential (primary) hypertension: Secondary | ICD-10-CM

## 2015-01-07 LAB — COMPREHENSIVE METABOLIC PANEL
ALT: 12 U/L (ref 0–53)
AST: 15 U/L (ref 0–37)
Albumin: 4 g/dL (ref 3.5–5.2)
Alkaline Phosphatase: 52 U/L (ref 39–117)
BUN: 14 mg/dL (ref 6–23)
CO2: 28 mEq/L (ref 19–32)
Calcium: 9.5 mg/dL (ref 8.4–10.5)
Chloride: 107 mEq/L (ref 96–112)
Creatinine, Ser: 1.02 mg/dL (ref 0.40–1.50)
GFR: 74.25 mL/min (ref >60.00)
Glucose, Bld: 119 mg/dL — ABNORMAL HIGH (ref 70–99)
Potassium: 4.6 mEq/L (ref 3.5–5.1)
Sodium: 139 mEq/L (ref 135–145)
Total Bilirubin: 0.8 mg/dL (ref 0.2–1.2)
Total Protein: 6.7 g/dL (ref 6.0–8.3)

## 2015-01-07 NOTE — Progress Notes (Signed)
Patient ID: [name] [name], male   DOB: 11-16-1930, 81 y.o.   MRN: [id number]   [name] [name] Date of Birth: 1930/03/03 Medical Record #[id number]  History of Present Illness:  [name] [name] is seen back today for a post hospital/TOC visit. Seen for Dr. Meda Coffee. He is an 81 year old male with DM, HTN and BPH.  Presented to [facility] just before Christmas with a 2 week history of DOE, PND, orthopnea and edema - found to be in AF with RVR and had + enzymes - consistent with NSTEMI. Noted to have mild to moderate disease in the LAD, chronic total occlusion of mid LCX and OM 2 brach with right to left and left to collaterals, moderate disease in the RCA. He will be managed medically but CTO PCI of the mid LCX could be entertained if he were to have more symptoms of angina. He converted to NSR with Diltiazem gtt. CHADSVASC of 4-5 - started on eliquis. EF 40 to 45% per echo with severe LAE. BP treated with increased doses of beta blocker.   Noted to have had some chest pain post cath - was not felt to be cardiac - he endorsed anxiety due to a brother passing with CAD.  Comes in today. Here with wife and grandson. Doing ok. He is pretty flat with his affect. Wife says "he never complains". Denies chest pain. Says he is not short of breath. Feels like everything is fine. Says someone told him his heart "was just fine". He wants to stop taking some of the medicines. Does not check BP at home. He is pretty active but no regular exercise. No bleeding or bruising. Expense long term for Eliquis may be an issue.   01/07/2015 - the patient is coming after 6 weeks, he is doing well and denies any chest pain or shortness of breath. He states he is fairly active helping around the house and doesn't feel limited in his activities of daily living. He didn't have to use sublingual nitroglycerin since the last visit. He has mild lower extremity edema that has been chronic. Denies any palpitations or syncope.   Current Outpatient  Prescriptions  Medication Sig Dispense Refill  . apixaban (ELIQUIS) 5 MG TABS tablet Take 1 tablet (5 mg total) by mouth 2 (two) times daily. 60 tablet 12  . aspirin EC 81 MG tablet Take 81 mg by mouth daily.    Marland Kitchen atorvastatin (LIPITOR) 40 MG tablet Take 1 tablet (40 mg total) by mouth daily at 6 PM. 30 tablet 11  . furosemide (LASIX) 40 MG tablet Take 1 tablet (40 mg total) by mouth daily. 30 tablet 11  . metFORMIN (GLUCOPHAGE) 1000 MG tablet Take 1 tablet (1,000 mg total) by mouth 2 (two) times daily with a meal. 60 tablet 11  . metoprolol tartrate (LOPRESSOR) 25 MG tablet Take 1 tablet (25 mg total) by mouth 2 (two) times daily. 60 tablet 11  . nitroGLYCERIN (NITROSTAT) 0.4 MG SL tablet Place 1 tablet (0.4 mg total) under the tongue every 5 (five) minutes x 3 doses as needed for chest pain. 25 tablet 12  . potassium chloride SA (K-DUR,KLOR-CON) 20 MEQ tablet Take 1 tablet (20 mEq total) by mouth daily. 30 tablet 11  . ramipril (ALTACE) 10 MG capsule Take 10 mg by mouth daily.    . tamsulosin (FLOMAX) 0.4 MG CAPS capsule Take 0.4 mg by mouth daily as needed (BPH).     No current facility-administered medications for this visit.  No Known Allergies  Past Medical History  Diagnosis Date  . Hypertension   . HLD (hyperlipidemia)   . NSTEMI (non-ST elevated myocardial infarction)   . Type II diabetes mellitus   . Paroxysmal atrial fibrillation     a. diagnosed on 11/2014 admission. Spontaneously converted to NSR. Placed on Eliquis  . Chronic systolic CHF (congestive heart failure)     a. 2d ECHO 11/24/14 with EF 40-45%, akinesis of the inferior, inferolateral and inferoseptal walls; overall mild to moderate reduction in LV function; grade 2 diastolic dysfunction; severe LAE; trace AI; moderate MR; mild TR with severely elevated pulmonary pressures.  Marland Kitchen CAD (coronary artery disease)     a. s/p LHC on 11/24/14 w/ mild-mod dz in LAD. CTO of mLCx and OM2 with R-->L and L-->L collaterals.      Past Surgical History  Procedure Laterality Date  . Appendectomy    . Total knee arthroplasty Left 2013  . Tonsillectomy    . Joint replacement    . Cardiac catheterization  11/24/2014  . Left heart catheterization with coronary angiogram N/A 11/24/2014    Procedure: LEFT HEART CATHETERIZATION WITH CORONARY ANGIOGRAM;  Surgeon: Jettie Booze, MD;  Location: [facility] CATH LAB;  Service: Cardiovascular;  Laterality: N/A;    History  Smoking status  . Never Smoker   Smokeless tobacco  . Never Used    History  Alcohol Use  . 8.4 oz/week  . 7 Not specified, 7 Shots of liquor per week    Family History  Problem Relation Age of Onset  . Cancer Father 85    Deceased  . Stroke Mother     Deceased  . Heart attack Brother     Deceased. Had cath with stents followed by PE    Review of Systems: The review of systems is per the HPI.  All other systems were reviewed and are negative.  Physical Exam: BP 132/72 mmHg  Pulse 56  Ht 5\' 11"  (1.803 m)  Wt 221 lb (100.245 kg)  BMI 30.84 kg/m2  Recheck of his BP by me is 130/60. Patient is alert and in no acute distress. His affect is pretty flat. Skin is warm and dry. Color is normal.  HEENT is unremarkable. Normocephalic/atraumatic. PERRL. Sclera are nonicteric. Neck is supple. No masses. No JVD. Lungs are clear. Cardiac exam shows a regular rate and rhythm. Abdomen is soft. Extremities are with mild B/L edema. Gait and ROM are intact. No gross neurologic deficits noted.  Wt Readings from Last 3 Encounters:  01/07/15 221 lb (100.245 kg)  12/03/14 218 lb (98.884 kg)  11/26/14 217 lb 2.5 oz (98.5 kg)    LABORATORY DATA/PROCEDURES: EKG today shows NSR - has lateral T wave changes which are new - tracing reviewed with Dr. Acie Fredrickson.   Lab Results  Component Value Date   WBC 9.2 12/03/2014   HGB 14.5 12/03/2014   HCT 44.7 12/03/2014   PLT 254.0 12/03/2014   GLUCOSE 120* 12/03/2014   CHOL 173 11/23/2014   TRIG 182* 11/23/2014    HDL 48 11/23/2014   LDLCALC 89 11/23/2014   NA 137 12/03/2014   K 4.7 12/03/2014   CL 100 12/03/2014   CREATININE 1.4 12/03/2014   BUN 21 12/03/2014   CO2 27 12/03/2014   TSH 5.210* 11/23/2014   INR 1.09 11/24/2014   HGBA1C 6.4* 11/23/2014   Lab Results  Component Value Date   TROPONINI 3.91* 11/23/2014    BNP (last 3 results)  Recent Labs  11/23/14 0636  PROBNP 2215.0*   Echo Study Conclusions from 11/2014  - Left ventricle: The cavity size was normal. Wall thickness was increased in a pattern of mild LVH. Systolic function was mildly to moderately reduced. The estimated ejection fraction was in the range of 40% to 45%. There is akinesis of the inferolateral, inferior, and inferoseptal myocardium. Features are consistent with a pseudonormal left ventricular filling pattern, with concomitant abnormal relaxation and increased filling pressure (grade 2 diastolic dysfunction). - Aortic valve: There was trivial regurgitation. - Mitral valve: Calcified annulus. Mildly thickened leaflets . There was moderate regurgitation. - Left atrium: The atrium was severely dilated. - Pulmonary arteries: Systolic pressure was severely increased. PA peak pressure: 60 mm Hg (S).  Impressions:  - Akinesis of the inferior, inferolateral and inferoseptal walls; overall mild to moderate reduction in LV function; grade 2 diastolic dysfunction; severe LAE; trace AI; moderate MR; mild TR with severely elevated pulmonary pressures.   PROCEDURE: Left heart catheterization with selective coronary angiography, attempted PCI of the mid circumflex.  INDICATIONS: Non-STEMI  The risks, benefits, and details of the procedure were explained to the patient. The patient verbalized understanding and wanted to proceed. Informed written consent was obtained.  PROCEDURE TECHNIQUE: After Xylocaine anesthesia a 63F slender sheath was placed in the right radial artery with a  single anterior needle wall stick. IV Heparin was given. Right coronary angiography was done using a Judkins R4 guide catheter. Left coronary angiography was done using a Judkins L3.5 guide catheter. Left heart catheterization was done using a pigtail catheter. The intervention was performed. Please see below for details. A TR band was used for hemostasis.  CONTRAST: Total of 1:30 cc.  COMPLICATIONS: None.   HEMODYNAMICS: Aortic pressure was 121/70; LV pressure was 125/6; LVEDP 12. There was no gradient between the left ventricle and aorta.   ANGIOGRAPHIC DATA: The left main coronary artery is widely patent. There is mild distal disease.  The left anterior descending artery is a large vessel proximally. In the mid vessel, there is moderate diffuse disease. There is a small first diagonal which is patent. The second diagonal is large and widely patent. The remainder of the LAD is patent.  The left circumflex artery is a large vessel proximally. There is a large first obtuse marginal which has mild disease. After the first obtuse marginal, the circumflex is occluded. This leads for the most part into a large OM 2. The OM 2 fills by collaterals from the RCA as well as from the LAD. The proximal portion of the OM 2 appears to be diseased.  The right coronary artery is a large, dominant vessel. There is moderate, diffuse mid vessel disease. The posterior lateral artery gives epicardial collaterals to the distal circumflex territory. The posterior descending artery gives collaterals to the large OM1.  LEFT VENTRICULOGRAM: Left ventricular angiogram was not done. LVEDP was 12 mmHg.  PCI NARRATIVE: IV Angiomax was given. A CLS 3.0 guide catheter was used to engage the left main. A CT was used to check that the Angiomax was therapeutic. A pro-water wire was advanced to the ostium of the OM 2/mid circumflex occlusion. This wire would not cross. It was left in the true circumflex. A fielder  XT was then advanced to the occlusion but this would not cross. A miracle brothers 3 g wire was then advanced and also would not cross. The miracle 3 g wire was removed and placed into an over-the-wire balloon for additional support. Even with this  combination, the miracle brothers wire would not cross. We switched out the 3 g wire for a 6 g wire. This 6 g wire with support from a balloon would also not cross. At that point, final angiography was performed. There is no dissection. There is no dye staining. We stopped the procedure at that time to avoid any complications.   IMPRESSIONS:  1. Widely patent left main coronary artery. 2. Mild to moderate disease in the left anterior descending artery and its branches. 3. 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 There are right to left and left to left collaterals which fill this large OM 2 and distal circumflex system. 4. Moderate disease in the mid right coronary artery. 5. Left ventricular systolic function was not assessed. LVEDP 12 mmHg.  RECOMMENDATION: Continue medical therapy. Should be able to start novel oral anticoagulant such as Eliquis tomorrow. If he has further symptoms of angina, would bring him back for CTO PCI of the mid circumflex. The lesion itself is favorable for PCI. I think we would have better support from a femoral approach. He will follow-up with Dr. Meda Coffee. If symptoms persist, I can see him as well. Anticipate discharge tomorrow.  LVEDP was normal. Will hold Lasix today. He would likely need to go home on a low dose of oral Lasix.      ECG: Sinus bradycardia, LAD, LVH with repolarization changes, unchanged from 12/03/2014    Assessment / Plan:  1. NSTEMI - mild to moderate disease noted by cath with CTO of the LCX - doing ok clinically but seems pretty sedate/flat in his affect. He is to let us know if he has chest pain. Would continue with his current regimen. Discussed with  Dr. Acie Fredrickson as well who is in agreement. Recheck EKG on return. No need to use NTG, refused to go to the cardiac rehab. ECG unchanged.  2. AF with RVR - in sinus today.   3. Chronic anticoagulation on Eliquis - no bleeding or bruising - recheck CBC and BMET today. They will look in to the cost of xarelto or Pradaxa. May have to transition to coumadin.  4. HTN -  Recheck BP by me is 130/60. No change with current regimen.   5. Severely elevated PA pressures - not short of breath - this may need to be addressed in the future, BP doesn't allow to add imdur or amlodipine.  6. LV dysfunction - asymptomatic. On beta blocker and ACE. Recheck echo in 3 months post MI.  7. LE edema - start compression stockings, Crea elevated at the last visit, we will rechcek today  8. HLP - chcek LFTs  Follow up in 2 months.    Dorothy Spark, MD, [facility] 01/07/2015

## 2015-01-07 NOTE — Patient Instructions (Addendum)
Your physician recommends that you continue on your current medications as directed. Please refer to the Current Medication list given to you today.   Your physician recommends that you return for lab work in: TODAY (CMET)    Your physician has requested that you have an echocardiogram. Echocardiography is a painless test that uses sound waves to create images of your heart. It provides your doctor with information about the size and shape of your heart and how well your heart's chambers and valves are working. This procedure takes approximately one hour. There are no restrictions for this procedure.     DR Meda Coffee HAS PRESCRIBED FOR YOU TO WEAR COMPRESSION STOCKINGS    Your physician recommends that you schedule a follow-up appointment in: [name] [name]

## 2015-03-05 ENCOUNTER — Ambulatory Visit (HOSPITAL_COMMUNITY): Payer: Medicare HMO | Attending: Cardiology | Admitting: Radiology

## 2015-03-05 DIAGNOSIS — I5022 Chronic systolic (congestive) heart failure: Secondary | ICD-10-CM | POA: Diagnosis present

## 2015-03-05 DIAGNOSIS — I10 Essential (primary) hypertension: Secondary | ICD-10-CM | POA: Insufficient documentation

## 2015-03-05 DIAGNOSIS — E669 Obesity, unspecified: Secondary | ICD-10-CM | POA: Diagnosis not present

## 2015-03-05 DIAGNOSIS — E119 Type 2 diabetes mellitus without complications: Secondary | ICD-10-CM | POA: Insufficient documentation

## 2015-03-05 DIAGNOSIS — I25119 Atherosclerotic heart disease of native coronary artery with unspecified angina pectoris: Secondary | ICD-10-CM

## 2015-03-05 DIAGNOSIS — E785 Hyperlipidemia, unspecified: Secondary | ICD-10-CM | POA: Insufficient documentation

## 2015-03-05 NOTE — Progress Notes (Signed)
Echocardiogram performed.  

## 2015-03-09 ENCOUNTER — Ambulatory Visit (INDEPENDENT_AMBULATORY_CARE_PROVIDER_SITE_OTHER): Payer: Medicare HMO | Admitting: Cardiology

## 2015-03-09 ENCOUNTER — Encounter: Payer: Self-pay | Admitting: Cardiology

## 2015-03-09 VITALS — BP 122/72 | HR 67 | Ht 71.0 in | Wt 222.0 lb

## 2015-03-09 DIAGNOSIS — E785 Hyperlipidemia, unspecified: Secondary | ICD-10-CM | POA: Diagnosis not present

## 2015-03-09 DIAGNOSIS — I270 Primary pulmonary hypertension: Secondary | ICD-10-CM

## 2015-03-09 DIAGNOSIS — I214 Non-ST elevation (NSTEMI) myocardial infarction: Secondary | ICD-10-CM | POA: Diagnosis not present

## 2015-03-09 DIAGNOSIS — I429 Cardiomyopathy, unspecified: Secondary | ICD-10-CM | POA: Diagnosis not present

## 2015-03-09 DIAGNOSIS — I272 Pulmonary hypertension, unspecified: Secondary | ICD-10-CM

## 2015-03-09 NOTE — Progress Notes (Signed)
Patient ID: [name] [name], male   DOB: 13-Dec-1929, 81 y.o.   MRN: [id number]     [name] [name] Date of Birth: 1930-12-03 Medical Record #[id number]  History of Present Illness:  [name] [name] is seen back today for a post hospital/TOC visit. Seen for Dr. Meda Coffee. He is an 81 year old male with DM, HTN and BPH.  Presented to [facility] just before Christmas with a 2 week history of DOE, PND, orthopnea and edema - found to be in AF with RVR and had + enzymes - consistent with NSTEMI. Noted to have mild to moderate disease in the LAD, chronic total occlusion of mid LCX and OM 2 brach with right to left and left to collaterals, moderate disease in the RCA. He will be managed medically but CTO PCI of the mid LCX could be entertained if he were to have more symptoms of angina. He converted to NSR with Diltiazem gtt. CHADSVASC of 4-5 - started on eliquis. EF 40 to 45% per echo with severe LAE. BP treated with increased doses of beta blocker.   Noted to have had some chest pain post cath - was not felt to be cardiac - he endorsed anxiety due to a brother passing with CAD.  Comes in today. Here with wife and grandson. Doing ok. He is pretty flat with his affect. Wife says "he never complains". Denies chest pain. Says he is not short of breath. Feels like everything is fine. Says someone told him his heart "was just fine". He wants to stop taking some of the medicines. Does not check BP at home. He is pretty active but no regular exercise. No bleeding or bruising. Expense long term for Eliquis may be an issue.   01/07/2015 - the patient is coming after 6 weeks, he is doing well and denies any chest pain or shortness of breath. He states he is fairly active helping around the house and doesn't feel limited in his activities of daily living. He didn't have to use sublingual nitroglycerin since the last visit. He has mild lower extremity edema that has been chronic. Denies any palpitations or syncope.  03/09/15 - 2 months  follow up, no change, continues to be active, complaint with his meds, no CP, no SOB< no palpitations or syncope. No LE edema, no muscle pain or joints pain.    Current Outpatient Prescriptions  Medication Sig Dispense Refill  . apixaban (ELIQUIS) 5 MG TABS tablet Take 1 tablet (5 mg total) by mouth 2 (two) times daily. 60 tablet 12  . aspirin EC 81 MG tablet Take 81 mg by mouth daily.    Marland Kitchen atorvastatin (LIPITOR) 40 MG tablet Take 1 tablet (40 mg total) by mouth daily at 6 PM. 30 tablet 11  . furosemide (LASIX) 40 MG tablet Take 1 tablet (40 mg total) by mouth daily. 30 tablet 11  . metFORMIN (GLUCOPHAGE) 1000 MG tablet Take 1 tablet (1,000 mg total) by mouth 2 (two) times daily with a meal. 60 tablet 11  . metoprolol tartrate (LOPRESSOR) 25 MG tablet Take 1 tablet (25 mg total) by mouth 2 (two) times daily. 60 tablet 11  . nitroGLYCERIN (NITROSTAT) 0.4 MG SL tablet Place 1 tablet (0.4 mg total) under the tongue every 5 (five) minutes x 3 doses as needed for chest pain. 25 tablet 12  . potassium chloride SA (K-DUR,KLOR-CON) 20 MEQ tablet Take 1 tablet (20 mEq total) by mouth daily. 30 tablet 11  . ramipril (ALTACE) 10 MG capsule Take 10  mg by mouth daily.    . tamsulosin (FLOMAX) 0.4 MG CAPS capsule Take 0.4 mg by mouth daily as needed (BPH).     No current facility-administered medications for this visit.    No Known Allergies  Past Medical History  Diagnosis Date  . Hypertension   . HLD (hyperlipidemia)   . NSTEMI (non-ST elevated myocardial infarction)   . Type II diabetes mellitus   . Paroxysmal atrial fibrillation     a. diagnosed on 11/2014 admission. Spontaneously converted to NSR. Placed on Eliquis  . Chronic systolic CHF (congestive heart failure)     a. 2d ECHO 11/24/14 with EF 40-45%, akinesis of the inferior, inferolateral and inferoseptal walls; overall mild to moderate reduction in LV function; grade 2 diastolic dysfunction; severe LAE; trace AI; moderate MR; mild TR with  severely elevated pulmonary pressures.  Marland Kitchen CAD (coronary artery disease)     a. s/p LHC on 11/24/14 w/ mild-mod dz in LAD. CTO of mLCx and OM2 with R-->L and L-->L collaterals.     Past Surgical History  Procedure Laterality Date  . Appendectomy    . Total knee arthroplasty Left 2013  . Tonsillectomy    . Joint replacement    . Cardiac catheterization  11/24/2014  . Left heart catheterization with coronary angiogram N/A 11/24/2014    Procedure: LEFT HEART CATHETERIZATION WITH CORONARY ANGIOGRAM;  Surgeon: Jettie Booze, MD;  Location: [facility] CATH LAB;  Service: Cardiovascular;  Laterality: N/A;    History  Smoking status  . Never Smoker   Smokeless tobacco  . Never Used    History  Alcohol Use  . 8.4 oz/week  . 7 Standard drinks or equivalent, 7 Shots of liquor per week    Family History  Problem Relation Age of Onset  . Cancer Father 68    Deceased  . Stroke Mother     Deceased  . Heart attack Brother     Deceased. Had cath with stents followed by PE    Review of Systems: The review of systems is per the HPI.  All other systems were reviewed and are negative.  Physical Exam: BP 122/72 mmHg  Pulse 67  Ht 5\' 11"  (1.803 m)  Wt 222 lb (100.699 kg)  BMI 30.98 kg/m2  SpO2 95%  Recheck of his BP by me is 130/60. Patient is alert and in no acute distress. His affect is pretty flat. Skin is warm and dry. Color is normal.  HEENT is unremarkable. Normocephalic/atraumatic. PERRL. Sclera are nonicteric. Neck is supple. No masses. No JVD. Lungs are clear. Cardiac exam shows a regular rate and rhythm. Abdomen is soft. Extremities are with mild B/L edema. Gait and ROM are intact. No gross neurologic deficits noted.  Wt Readings from Last 3 Encounters:  03/09/15 222 lb (100.699 kg)  01/07/15 221 lb (100.245 kg)  12/03/14 218 lb (98.884 kg)    LABORATORY DATA/PROCEDURES: EKG today shows NSR - has lateral T wave changes which are new - tracing reviewed with Dr. Acie Fredrickson.    Lab Results  Component Value Date   WBC 9.2 12/03/2014   HGB 14.5 12/03/2014   HCT 44.7 12/03/2014   PLT 254.0 12/03/2014   GLUCOSE 119* 01/07/2015   CHOL 173 11/23/2014   TRIG 182* 11/23/2014   HDL 48 11/23/2014   LDLCALC 89 11/23/2014   ALT 12 01/07/2015   AST 15 01/07/2015   NA 139 01/07/2015   K 4.6 01/07/2015   CL 107 01/07/2015   CREATININE 1.02  01/07/2015   BUN 14 01/07/2015   CO2 28 01/07/2015   TSH 5.210* 11/23/2014   INR 1.09 11/24/2014   HGBA1C 6.4* 11/23/2014   Lab Results  Component Value Date   TROPONINI 3.91* 11/23/2014    BNP (last 3 results)  Recent Labs  11/23/14 0636  PROBNP 2215.0*   Echo Study Conclusions from 11/2014  - Left ventricle: The cavity size was normal. Wall thickness was increased in a pattern of mild LVH. Systolic function was mildly to moderately reduced. The estimated ejection fraction was in the range of 40% to 45%. There is akinesis of the inferolateral, inferior, and inferoseptal myocardium. Features are consistent with a pseudonormal left ventricular filling pattern, with concomitant abnormal relaxation and increased filling pressure (grade 2 diastolic dysfunction). - Aortic valve: There was trivial regurgitation. - Mitral valve: Calcified annulus. Mildly thickened leaflets . There was moderate regurgitation. - Left atrium: The atrium was severely dilated. - Pulmonary arteries: Systolic pressure was severely increased. PA peak pressure: 60 mm Hg (S).  Impressions:  - Akinesis of the inferior, inferolateral and inferoseptal walls; overall mild to moderate reduction in LV function; grade 2 diastolic dysfunction; severe LAE; trace AI; moderate MR; mild TR with severely elevated pulmonary pressures.   PROCEDURE: Left heart catheterization with selective coronary angiography, attempted PCI of the mid circumflex.  INDICATIONS: Non-STEMI  The risks, benefits, and details of the procedure  were explained to the patient. The patient verbalized understanding and wanted to proceed. Informed written consent was obtained.  PROCEDURE TECHNIQUE: After Xylocaine anesthesia a 20F slender sheath was placed in the right radial artery with a single anterior needle wall stick. IV Heparin was given. Right coronary angiography was done using a Judkins R4 guide catheter. Left coronary angiography was done using a Judkins L3.5 guide catheter. Left heart catheterization was done using a pigtail catheter. The intervention was performed. Please see below for details. A TR band was used for hemostasis.  CONTRAST: Total of 1:30 cc.  COMPLICATIONS: None.   HEMODYNAMICS: Aortic pressure was 121/70; LV pressure was 125/6; LVEDP 12. There was no gradient between the left ventricle and aorta.   ANGIOGRAPHIC DATA: The left main coronary artery is widely patent. There is mild distal disease.  The left anterior descending artery is a large vessel proximally. In the mid vessel, there is moderate diffuse disease. There is a small first diagonal which is patent. The second diagonal is large and widely patent. The remainder of the LAD is patent.  The left circumflex artery is a large vessel proximally. There is a large first obtuse marginal which has mild disease. After the first obtuse marginal, the circumflex is occluded. This leads for the most part into a large OM 2. The OM 2 fills by collaterals from the RCA as well as from the LAD. The proximal portion of the OM 2 appears to be diseased.  The right coronary artery is a large, dominant vessel. There is moderate, diffuse mid vessel disease. The posterior lateral artery gives epicardial collaterals to the distal circumflex territory. The posterior descending artery gives collaterals to the large OM1.  LEFT VENTRICULOGRAM: Left ventricular angiogram was not done. LVEDP was 12 mmHg.  PCI NARRATIVE: IV Angiomax was given. A CLS 3.0 guide  catheter was used to engage the left main. A CT was used to check that the Angiomax was therapeutic. A pro-water wire was advanced to the ostium of the OM 2/mid circumflex occlusion. This wire would not cross. It was left in  the true circumflex. A fielder XT was then advanced to the occlusion but this would not cross. A miracle brothers 3 g wire was then advanced and also would not cross. The miracle 3 g wire was removed and placed into an over-the-wire balloon for additional support. Even with this combination, the miracle brothers wire would not cross. We switched out the 3 g wire for a 6 g wire. This 6 g wire with support from a balloon would also not cross. At that point, final angiography was performed. There is no dissection. There is no dye staining. We stopped the procedure at that time to avoid any complications.   IMPRESSIONS:  1. Widely patent left main coronary artery. 2. Mild to moderate disease in the left anterior descending artery and its branches. 3. 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 There are right to left and left to left collaterals which fill this large OM 2 and distal circumflex system. 4. Moderate disease in the mid right coronary artery. 5. Left ventricular systolic function was not assessed. LVEDP 12 mmHg.  RECOMMENDATION: Continue medical therapy. Should be able to start novel oral anticoagulant such as Eliquis tomorrow. If he has further symptoms of angina, would bring him back for CTO PCI of the mid circumflex. The lesion itself is favorable for PCI. I think we would have better support from a femoral approach. He will follow-up with Dr. Meda Coffee. If symptoms persist, I can see him as well. Anticipate discharge tomorrow.  LVEDP was normal. Will hold Lasix today. He would likely need to go home on a low dose of oral Lasix.      ECG: Sinus bradycardia, LAD, LVH with repolarization changes, unchanged from 12/03/2014  Echo:  03/04/15 Study Conclusions  - Left ventricle: The cavity size was normal. Systolic function was mildly to moderately reduced. The estimated ejection fraction was in the range of 40% to 45%. There is hypokinesis of the inferior myocardium. Features are consistent with a pseudonormal left ventricular filling pattern, with concomitant abnormal relaxation and increased filling pressure (grade 2 diastolic dysfunction). - Aortic valve: There was mild regurgitation. - Mitral valve: Calcified annulus. There was moderate regurgitation. - Left atrium: The atrium was moderately dilated. - Right ventricle: The cavity size was mildly dilated. Wall thickness was normal. - Pulmonary arteries: Systolic pressure was moderately increased. PA peak pressure: 41 mm Hg (S).  Impressions:  - Compared to the prior study, there has been no significant interval change.    Assessment / Plan:  1. NSTEMI - mild to moderate disease noted by cath with CTO of the LCX -  - asymptomatic, active, refuses to go to rehab, but active.  2. AF with RVR - in sinus today again.   3. Chronic anticoagulation on Eliquis - no bleeding or bruising - recheck CBC and BMET today. They will look in to the cost of xarelto or Pradaxa. May have to transition to coumadin.  4. HTN -  Recheck BP by me is 130/60. No change with current regimen.   5. Severely elevated PA pressures - improved on the last echo RVSP 60--> 41 mmHg, not short of breath - this may need to be addressed in the future, BP doesn't allow to add imdur or amlodipine.  6. LV dysfunction - asymptomatic. LVEF remain 40-45% with inferior wall hypokinesis, On beta blocker and ACE.   7. LE edema - start compression stockings, Crea elevated at the last visit, we will rechcek today  8. HLP - chcek LFTs  Follow up in 6 months.    Dorothy Spark, MD, [facility] 03/09/2015

## 2015-03-09 NOTE — Patient Instructions (Addendum)
Your physician recommends that you continue on your current medications as directed. Please refer to the Current Medication list given to you today.     Your physician wants you to follow-up in: 6 MONTHS WITH DR NELSON You will receive a reminder letter in the mail two months in advance. If you don't receive a letter, please call our office to schedule the follow-up appointment.  

## 2015-04-23 ENCOUNTER — Emergency Department (HOSPITAL_COMMUNITY): Payer: Medicare HMO

## 2015-04-23 ENCOUNTER — Inpatient Hospital Stay (HOSPITAL_COMMUNITY)
Admission: EM | Admit: 2015-04-23 | Discharge: 2015-04-25 | DRG: 291 | Disposition: A | Discharge status: Home / Self Care | Payer: Medicare HMO | Attending: Cardiology | Admitting: Cardiology

## 2015-04-23 ENCOUNTER — Encounter (HOSPITAL_COMMUNITY): Payer: Self-pay | Admitting: Neurology

## 2015-04-23 ENCOUNTER — Other Ambulatory Visit (HOSPITAL_COMMUNITY): Payer: Self-pay

## 2015-04-23 DIAGNOSIS — J810 Acute pulmonary edema: Secondary | ICD-10-CM

## 2015-04-23 DIAGNOSIS — I159 Secondary hypertension, unspecified: Secondary | ICD-10-CM

## 2015-04-23 DIAGNOSIS — I480 Paroxysmal atrial fibrillation: Secondary | ICD-10-CM | POA: Diagnosis present

## 2015-04-23 DIAGNOSIS — J9600 Acute respiratory failure, unspecified whether with hypoxia or hypercapnia: Secondary | ICD-10-CM | POA: Diagnosis not present

## 2015-04-23 DIAGNOSIS — N179 Acute kidney failure, unspecified: Secondary | ICD-10-CM | POA: Diagnosis present

## 2015-04-23 DIAGNOSIS — Z96652 Presence of left artificial knee joint: Secondary | ICD-10-CM | POA: Diagnosis present

## 2015-04-23 DIAGNOSIS — J811 Chronic pulmonary edema: Secondary | ICD-10-CM

## 2015-04-23 DIAGNOSIS — E785 Hyperlipidemia, unspecified: Secondary | ICD-10-CM | POA: Diagnosis present

## 2015-04-23 DIAGNOSIS — I509 Heart failure, unspecified: Secondary | ICD-10-CM | POA: Diagnosis not present

## 2015-04-23 DIAGNOSIS — R0602 Shortness of breath: Secondary | ICD-10-CM | POA: Diagnosis present

## 2015-04-23 DIAGNOSIS — G9340 Encephalopathy, unspecified: Secondary | ICD-10-CM | POA: Diagnosis present

## 2015-04-23 DIAGNOSIS — E669 Obesity, unspecified: Secondary | ICD-10-CM | POA: Diagnosis present

## 2015-04-23 DIAGNOSIS — J9601 Acute respiratory failure with hypoxia: Secondary | ICD-10-CM | POA: Diagnosis not present

## 2015-04-23 DIAGNOSIS — N183 Chronic kidney disease, stage 3 (moderate): Secondary | ICD-10-CM | POA: Diagnosis present

## 2015-04-23 DIAGNOSIS — I10 Essential (primary) hypertension: Secondary | ICD-10-CM

## 2015-04-23 DIAGNOSIS — I5043 Acute on chronic combined systolic (congestive) and diastolic (congestive) heart failure: Principal | ICD-10-CM | POA: Diagnosis present

## 2015-04-23 DIAGNOSIS — I5042 Chronic combined systolic (congestive) and diastolic (congestive) heart failure: Secondary | ICD-10-CM | POA: Diagnosis present

## 2015-04-23 DIAGNOSIS — E1169 Type 2 diabetes mellitus with other specified complication: Secondary | ICD-10-CM

## 2015-04-23 DIAGNOSIS — J9620 Acute and chronic respiratory failure, unspecified whether with hypoxia or hypercapnia: Secondary | ICD-10-CM | POA: Diagnosis present

## 2015-04-23 DIAGNOSIS — N390 Urinary tract infection, site not specified: Secondary | ICD-10-CM | POA: Diagnosis present

## 2015-04-23 DIAGNOSIS — E119 Type 2 diabetes mellitus without complications: Secondary | ICD-10-CM | POA: Diagnosis present

## 2015-04-23 DIAGNOSIS — I129 Hypertensive chronic kidney disease with stage 1 through stage 4 chronic kidney disease, or unspecified chronic kidney disease: Secondary | ICD-10-CM | POA: Diagnosis present

## 2015-04-23 DIAGNOSIS — I252 Old myocardial infarction: Secondary | ICD-10-CM | POA: Diagnosis not present

## 2015-04-23 DIAGNOSIS — I2510 Atherosclerotic heart disease of native coronary artery without angina pectoris: Secondary | ICD-10-CM | POA: Diagnosis not present

## 2015-04-23 DIAGNOSIS — Z7901 Long term (current) use of anticoagulants: Secondary | ICD-10-CM | POA: Diagnosis not present

## 2015-04-23 DIAGNOSIS — I5021 Acute systolic (congestive) heart failure: Secondary | ICD-10-CM | POA: Diagnosis not present

## 2015-04-23 DIAGNOSIS — Z6829 Body mass index (BMI) 29.0-29.9, adult: Secondary | ICD-10-CM

## 2015-04-23 DIAGNOSIS — Z7982 Long term (current) use of aspirin: Secondary | ICD-10-CM

## 2015-04-23 DIAGNOSIS — I160 Hypertensive urgency: Secondary | ICD-10-CM | POA: Diagnosis present

## 2015-04-23 DIAGNOSIS — J189 Pneumonia, unspecified organism: Secondary | ICD-10-CM | POA: Diagnosis not present

## 2015-04-23 HISTORY — DX: Obesity, unspecified: E66.9

## 2015-04-23 HISTORY — DX: Chronic combined systolic (congestive) and diastolic (congestive) heart failure: I50.42

## 2015-04-23 HISTORY — DX: Disorder of kidney and ureter, unspecified: N28.9

## 2015-04-23 LAB — CBC WITH DIFFERENTIAL/PLATELET
BASOS ABS: 0.1 10*3/uL (ref 0.0–0.1)
BASOS PCT: 1 % (ref 0–1)
Eosinophils Absolute: 0.2 10*3/uL (ref 0.0–0.7)
Eosinophils Relative: 1 % (ref 0–5)
HCT: 44.7 % (ref 39.0–52.0)
Hemoglobin: 14.2 g/dL (ref 13.0–17.0)
LYMPHS PCT: 22 % (ref 12–46)
Lymphs Abs: 3.3 10*3/uL (ref 0.7–4.0)
MCH: 29.9 pg (ref 26.0–34.0)
MCHC: 31.8 g/dL (ref 30.0–36.0)
MCV: 94.1 fL (ref 78.0–100.0)
MONOS PCT: 8 % (ref 3–12)
Monocytes Absolute: 1.1 10*3/uL — ABNORMAL HIGH (ref 0.1–1.0)
NEUTROS ABS: 10.6 10*3/uL — AB (ref 1.7–7.7)
NEUTROS PCT: 70 % (ref 43–77)
Platelets: 196 10*3/uL (ref 150–400)
RBC: 4.75 MIL/uL (ref 4.22–5.81)
RDW: 14.1 % (ref 11.5–15.5)
WBC: 15.3 10*3/uL — ABNORMAL HIGH (ref 4.0–10.5)

## 2015-04-23 LAB — I-STAT ARTERIAL BLOOD GAS, ED
Acid-base deficit: 4 mmol/L — ABNORMAL HIGH (ref 0.0–2.0)
Bicarbonate: 21.7 mEq/L (ref 20.0–24.0)
O2 SAT: 99 %
PH ART: 7.323 — AB (ref 7.350–7.450)
TCO2: 23 mmol/L (ref 0–100)
pCO2 arterial: 41.8 mmHg (ref 35.0–45.0)
pO2, Arterial: 153 mmHg — ABNORMAL HIGH (ref 80.0–100.0)

## 2015-04-23 LAB — URINALYSIS, ROUTINE W REFLEX MICROSCOPIC
BILIRUBIN URINE: NEGATIVE
Glucose, UA: 250 mg/dL — AB
Ketones, ur: NEGATIVE mg/dL
NITRITE: NEGATIVE
PH: 5 (ref 5.0–8.0)
Protein, ur: 100 mg/dL — AB
SPECIFIC GRAVITY, URINE: 1.016 (ref 1.005–1.030)
Urobilinogen, UA: 1 mg/dL (ref 0.0–1.0)

## 2015-04-23 LAB — COMPREHENSIVE METABOLIC PANEL
ALBUMIN: 3.5 g/dL (ref 3.5–5.0)
ALK PHOS: 60 U/L (ref 38–126)
ALT: 15 U/L — AB (ref 17–63)
AST: 26 U/L (ref 15–41)
Anion gap: 15 (ref 5–15)
BILIRUBIN TOTAL: 1.5 mg/dL — AB (ref 0.3–1.2)
BUN: 17 mg/dL (ref 6–20)
CO2: 19 mmol/L — AB (ref 22–32)
CREATININE: 1.64 mg/dL — AB (ref 0.61–1.24)
Calcium: 9.1 mg/dL (ref 8.9–10.3)
Chloride: 101 mmol/L (ref 101–111)
GFR calc Af Amer: 43 mL/min — ABNORMAL LOW (ref >60)
GFR, EST NON AFRICAN AMERICAN: 37 mL/min — AB (ref >60)
Glucose, Bld: 276 mg/dL — ABNORMAL HIGH (ref 65–99)
Potassium: 4 mmol/L (ref 3.5–5.1)
SODIUM: 135 mmol/L (ref 135–145)
TOTAL PROTEIN: 7.2 g/dL (ref 6.5–8.1)

## 2015-04-23 LAB — URINE MICROSCOPIC-ADD ON

## 2015-04-23 LAB — I-STAT CHEM 8, ED
BUN: 20 mg/dL (ref 6–20)
CALCIUM ION: 1.22 mmol/L (ref 1.13–1.30)
Chloride: 103 mmol/L (ref 101–111)
Creatinine, Ser: 1.5 mg/dL — ABNORMAL HIGH (ref 0.61–1.24)
GLUCOSE: 288 mg/dL — AB (ref 65–99)
HEMATOCRIT: 48 % (ref 39.0–52.0)
HEMOGLOBIN: 16.3 g/dL (ref 13.0–17.0)
Potassium: 4 mmol/L (ref 3.5–5.1)
Sodium: 137 mmol/L (ref 135–145)
TCO2: 18 mmol/L (ref 0–100)

## 2015-04-23 LAB — I-STAT TROPONIN, ED: Troponin i, poc: 0.02 ng/mL (ref 0.00–0.08)

## 2015-04-23 LAB — BRAIN NATRIURETIC PEPTIDE
B NATRIURETIC PEPTIDE 5: 1110.5 pg/mL — AB (ref 0.0–100.0)
B NATRIURETIC PEPTIDE 5: 1076.3 pg/mL — AB (ref 0.0–100.0)

## 2015-04-23 LAB — TROPONIN I
Troponin I: 1.5 ng/mL (ref <0.031)
Troponin I: 1.45 ng/mL (ref <0.031)

## 2015-04-23 LAB — MRSA PCR SCREENING: MRSA by PCR: NEGATIVE

## 2015-04-23 LAB — GLUCOSE, CAPILLARY: Glucose-Capillary: 140 mg/dL — ABNORMAL HIGH (ref 65–99)

## 2015-04-23 LAB — CBG MONITORING, ED: Glucose-Capillary: 254 mg/dL — ABNORMAL HIGH (ref 65–99)

## 2015-04-23 MED ORDER — NITROGLYCERIN 0.4 MG SL SUBL
SUBLINGUAL_TABLET | SUBLINGUAL | Status: AC
  Administered 2015-04-23: 0.4 mg via SUBLINGUAL
  Filled 2015-04-23: qty 1

## 2015-04-23 MED ORDER — DEXTROSE 5 % IV SOLN
1.0000 g | INTRAVENOUS | Status: DC
  Administered 2015-04-24 – 2015-04-25 (×2): 1 g via INTRAVENOUS
  Filled 2015-04-23 (×2): qty 10

## 2015-04-23 MED ORDER — RAMIPRIL 10 MG PO CAPS
10.0000 mg | ORAL_CAPSULE | Freq: Every day | ORAL | Status: DC
  Administered 2015-04-24 – 2015-04-25 (×2): 10 mg via ORAL
  Filled 2015-04-23 (×3): qty 1

## 2015-04-23 MED ORDER — DEXTROSE 5 % IV SOLN
1.0000 g | Freq: Once | INTRAVENOUS | Status: AC
  Administered 2015-04-23: 1 g via INTRAVENOUS
  Filled 2015-04-23: qty 10

## 2015-04-23 MED ORDER — CETYLPYRIDINIUM CHLORIDE 0.05 % MT LIQD
7.0000 mL | Freq: Two times a day (BID) | OROMUCOSAL | Status: DC
  Administered 2015-04-24 – 2015-04-25 (×3): 7 mL via OROMUCOSAL

## 2015-04-23 MED ORDER — FUROSEMIDE 10 MG/ML IJ SOLN
80.0000 mg | Freq: Three times a day (TID) | INTRAMUSCULAR | Status: DC
  Administered 2015-04-23 (×2): 80 mg via INTRAVENOUS
  Filled 2015-04-23 (×5): qty 8

## 2015-04-23 MED ORDER — ATORVASTATIN CALCIUM 40 MG PO TABS
40.0000 mg | ORAL_TABLET | Freq: Every day | ORAL | Status: DC
  Administered 2015-04-23 – 2015-04-24 (×2): 40 mg via ORAL
  Filled 2015-04-23 (×3): qty 1

## 2015-04-23 MED ORDER — ONDANSETRON HCL 4 MG/2ML IJ SOLN: 4.0000 mg | Freq: Four times a day (QID) | INTRAMUSCULAR | Status: DC | PRN

## 2015-04-23 MED ORDER — POTASSIUM CHLORIDE CRYS ER 20 MEQ PO TBCR
20.0000 meq | EXTENDED_RELEASE_TABLET | Freq: Every day | ORAL | Status: DC
  Administered 2015-04-23 – 2015-04-25 (×3): 20 meq via ORAL
  Filled 2015-04-23 (×3): qty 1

## 2015-04-23 MED ORDER — SODIUM CHLORIDE 0.9 % IJ SOLN
3.0000 mL | Freq: Two times a day (BID) | INTRAMUSCULAR | Status: DC
  Administered 2015-04-23 – 2015-04-24 (×3): 3 mL via INTRAVENOUS

## 2015-04-23 MED ORDER — ASPIRIN EC 81 MG PO TBEC
81.0000 mg | DELAYED_RELEASE_TABLET | Freq: Every day | ORAL | Status: DC
  Administered 2015-04-24 – 2015-04-25 (×2): 81 mg via ORAL
  Filled 2015-04-23 (×3): qty 1

## 2015-04-23 MED ORDER — NITROGLYCERIN 0.4 MG SL SUBL
0.4000 mg | SUBLINGUAL_TABLET | SUBLINGUAL | Status: DC | PRN
  Administered 2015-04-23 (×2): 0.4 mg via SUBLINGUAL

## 2015-04-23 MED ORDER — INSULIN ASPART 100 UNIT/ML ~~LOC~~ SOLN
0.0000 [IU] | Freq: Three times a day (TID) | SUBCUTANEOUS | Status: DC
  Administered 2015-04-23: 2 [IU] via SUBCUTANEOUS

## 2015-04-23 MED ORDER — ACETAMINOPHEN 325 MG PO TABS: 650.0000 mg | ORAL_TABLET | ORAL | Status: DC | PRN

## 2015-04-23 MED ORDER — TAMSULOSIN HCL 0.4 MG PO CAPS
0.4000 mg | ORAL_CAPSULE | Freq: Every day | ORAL | Status: DC | PRN
  Filled 2015-04-23: qty 1

## 2015-04-23 MED ORDER — APIXABAN 5 MG PO TABS
5.0000 mg | ORAL_TABLET | Freq: Two times a day (BID) | ORAL | Status: DC
  Administered 2015-04-23 – 2015-04-25 (×4): 5 mg via ORAL
  Filled 2015-04-23 (×6): qty 1

## 2015-04-23 MED ORDER — METOPROLOL TARTRATE 25 MG PO TABS
25.0000 mg | ORAL_TABLET | Freq: Two times a day (BID) | ORAL | Status: DC
  Administered 2015-04-23 – 2015-04-25 (×4): 25 mg via ORAL
  Filled 2015-04-23 (×5): qty 1

## 2015-04-23 MED ORDER — SODIUM CHLORIDE 0.9 % IV SOLN: 250.0000 mL | INTRAVENOUS | Status: DC | PRN

## 2015-04-23 MED ORDER — SODIUM CHLORIDE 0.9 % IJ SOLN: 3.0000 mL | INTRAMUSCULAR | Status: DC | PRN

## 2015-04-23 MED ORDER — FUROSEMIDE 10 MG/ML IJ SOLN
80.0000 mg | Freq: Once | INTRAMUSCULAR | Status: AC
  Administered 2015-04-23: 80 mg via INTRAVENOUS
  Filled 2015-04-23: qty 8

## 2015-04-23 MED ORDER — NITROGLYCERIN IN D5W 200-5 MCG/ML-% IV SOLN
0.0000 ug/min | Freq: Once | INTRAVENOUS | Status: AC
  Administered 2015-04-23: 5 ug/min via INTRAVENOUS
  Filled 2015-04-23: qty 250

## 2015-04-23 NOTE — Care Management Note (Signed)
Case Management Note  Patient Details  Name: [name] [name] MRN: [id number] Date of Birth: 22-Jun-1930  Subjective/Objective:    Adm w pul edema                Action/Plan:  Lives w wife, pcp dr Jerilynn Birkenhead  Expected Discharge Date:                  Expected Discharge Plan:  Home/Self Care  In-House Referral:     Discharge planning Services     Post Acute Care Choice:    Choice offered to:     DME Arranged:    DME Agency:     HH Arranged:    [place] Agency:     Status of Service:     Medicare Important Message Given:    Date Medicare IM Given:    Medicare IM give by:    Date Additional Medicare IM Given:    Additional Medicare Important Message give by:     If discussed at [place] of Stay Meetings, dates discussed:    Additional Comments: ur review done  Lacretia Leigh, RN 04/23/2015, 3:26 PM

## 2015-04-23 NOTE — ED Notes (Signed)
Radiology at bedside

## 2015-04-23 NOTE — ED Notes (Signed)
Pt comes from triage, sob, respiratory distress, unable to answer questions, will follow commands.

## 2015-04-23 NOTE — ED Notes (Signed)
Cardiology MD at bedside.

## 2015-04-23 NOTE — ED Provider Notes (Signed)
CSN: [id number]     Arrival date & time 04/23/15  1107 History   First MD Initiated Contact with Patient 04/23/15 1116     Chief Complaint  Patient presents with  . Respiratory Distress    Level V caveat due to urgent need for intervention (Consider location/radiation/quality/duration/timing/severity/associated sxs/prior Treatment) The history is provided by the patient.   patient came in and rest for distress. Initial sats in the 60s. Reportedly had been a little fatigued but shows breath began a couple hours prior to arrival. Initially mottled too dyspneic to speak. Denied chest pain. Was later discussion has had some chills of the last couple days. He has also been somewhat fatigued.  Past Medical History  Diagnosis Date  . Hypertension   . HLD (hyperlipidemia)   . NSTEMI (non-ST elevated myocardial infarction)   . Type II diabetes mellitus   . Paroxysmal atrial fibrillation     a. diagnosed on 11/2014 admission. Spontaneously converted to NSR. Placed on Eliquis  . Chronic systolic CHF (congestive heart failure)     a. 2d ECHO 11/24/14 with EF 40-45%, akinesis of the inferior, inferolateral and inferoseptal walls; overall mild to moderate reduction in LV function; grade 2 diastolic dysfunction; severe LAE; trace AI; moderate MR; mild TR with severely elevated pulmonary pressures.  Marland Kitchen CAD (coronary artery disease)     a. s/p LHC on 11/24/14 w/ mild-mod dz in LAD. CTO of mLCx and OM2 with R-->L and L-->L collaterals.    Past Surgical History  Procedure Laterality Date  . Appendectomy    . Total knee arthroplasty Left 2013  . Tonsillectomy    . Joint replacement    . Cardiac catheterization  11/24/2014  . Left heart catheterization with coronary angiogram N/A 11/24/2014    Procedure: LEFT HEART CATHETERIZATION WITH CORONARY ANGIOGRAM;  Surgeon: Jettie Booze, MD;  Location: [facility] CATH LAB;  Service: Cardiovascular;  Laterality: N/A;   Family History  Problem Relation Age of  Onset  . Cancer Father 50    Deceased  . Stroke Mother     Deceased  . Heart attack Brother     Deceased. Had cath with stents followed by PE   History  Substance Use Topics  . Smoking status: Never Smoker   . Smokeless tobacco: Never Used  . Alcohol Use: 8.4 oz/week    7 Standard drinks or equivalent, 7 Shots of liquor per week    Review of Systems  Unable to perform ROS     Allergies  Review of patient's allergies indicates no known allergies.  Home Medications   Prior to Admission medications   Medication Sig Start Date End Date Taking? Authorizing Provider  apixaban (ELIQUIS) 5 MG TABS tablet Take 1 tablet (5 mg total) by mouth 2 (two) times daily. 11/26/14  Yes Eileen Stanford, PA-C  aspirin EC 81 MG tablet Take 81 mg by mouth daily.   Yes Historical Provider, MD  atorvastatin (LIPITOR) 40 MG tablet Take 1 tablet (40 mg total) by mouth daily at 6 PM. 11/26/14  Yes Eileen Stanford, PA-C  metFORMIN (GLUCOPHAGE) 1000 MG tablet Take 1 tablet (1,000 mg total) by mouth 2 (two) times daily with a meal. 11/27/14  Yes Eileen Stanford, PA-C  metoprolol tartrate (LOPRESSOR) 25 MG tablet Take 1 tablet (25 mg total) by mouth 2 (two) times daily. 11/26/14  Yes Eileen Stanford, PA-C  potassium chloride SA (K-DUR,KLOR-CON) 20 MEQ tablet Take 1 tablet (20 mEq total) by mouth daily. 11/26/14  Yes Eileen Stanford, PA-C  ramipril (ALTACE) 10 MG capsule Take 10 mg by mouth daily.   Yes Historical Provider, MD  furosemide (LASIX) 40 MG tablet Take 1 tablet (40 mg total) by mouth daily. 11/26/14   Eileen Stanford, PA-C  nitroGLYCERIN (NITROSTAT) 0.4 MG SL tablet Place 1 tablet (0.4 mg total) under the tongue every 5 (five) minutes x 3 doses as needed for chest pain. 11/26/14   Eileen Stanford, PA-C  tamsulosin (FLOMAX) 0.4 MG CAPS capsule Take 0.4 mg by mouth daily as needed (BPH).    Historical Provider, MD   BP 126/61 mmHg  Pulse 74  Temp(Src) 97.6 F (36.4 C)  (Temporal)  Resp 21  SpO2 98% Physical Exam  Constitutional: He appears well-developed.  HENT:  Head: Atraumatic.  Eyes: Pupils are equal, round, and reactive to light.  Neck: Neck supple.  Cardiovascular:  Tachycardia  Pulmonary/Chest: He is in respiratory distress. He has rales.  Diffuse harsh rales in all lung fields. Respiratory distress with tachypnea.  Abdominal: There is no tenderness.  Musculoskeletal: He exhibits edema.  Mild bilateral lower extremity pitting edema  Neurological:  Patient is a somewhat difficult historian. Unable to give much history. Interestingly distress. Moving all extremities  Skin:  Skin is somewhat mottled on abdomen    ED Course  Procedures (including critical care time) Labs Review Labs Reviewed  CBC WITH DIFFERENTIAL/PLATELET - Abnormal; Notable for the following:    WBC 15.3 (*)    Neutro Abs 10.6 (*)    Monocytes Absolute 1.1 (*)    All other components within normal limits  COMPREHENSIVE METABOLIC PANEL - Abnormal; Notable for the following:    CO2 19 (*)    Glucose, Bld 276 (*)    Creatinine, Ser 1.64 (*)    ALT 15 (*)    Total Bilirubin 1.5 (*)    GFR calc non Af Amer 37 (*)    GFR calc Af Amer 43 (*)    All other components within normal limits  BRAIN NATRIURETIC PEPTIDE - Abnormal; Notable for the following:    B Natriuretic Peptide 1110.5 (*)    All other components within normal limits  URINALYSIS, ROUTINE W REFLEX MICROSCOPIC - Abnormal; Notable for the following:    APPearance TURBID (*)    Glucose, UA 250 (*)    Hgb urine dipstick LARGE (*)    Protein, ur 100 (*)    Leukocytes, UA LARGE (*)    All other components within normal limits  URINE MICROSCOPIC-ADD ON - Abnormal; Notable for the following:    Squamous Epithelial / LPF FEW (*)    Bacteria, UA MANY (*)    Casts GRANULAR CAST (*)    All other components within normal limits  I-STAT CHEM 8, ED - Abnormal; Notable for the following:    Creatinine, Ser 1.50 (*)     Glucose, Bld 288 (*)    All other components within normal limits  CBG MONITORING, ED - Abnormal; Notable for the following:    Glucose-Capillary 254 (*)    All other components within normal limits  I-STAT ARTERIAL BLOOD GAS, ED - Abnormal; Notable for the following:    pH, Arterial 7.323 (*)    pO2, Arterial 153.0 (*)    Acid-base deficit 4.0 (*)    All other components within normal limits  URINE CULTURE  I-STAT TROPOININ, ED    Imaging Review Dg Chest Portable 1 View  04/23/2015   CLINICAL DATA:  Hypertension, diabetes,  respiratory distress  EXAM: PORTABLE CHEST - 1 VIEW  COMPARISON:  11/23/2014  FINDINGS: Cardiomegaly again noted. Central mild vascular congestion. Mild perihilar and infrahilar interstitial prominence suspicious for interstitial edema. No segmental infiltrate  IMPRESSION: Central mild vascular congestion. Mild perihilar and infrahilar interstitial prominence suspicious for interstitial edema. No segmental infiltrate .   Electronically Signed   By: Lahoma Crocker M.D.   On: 04/23/2015 12:34     EKG Interpretation   Date/Time:  Thursday Apr 23 2015 11:10:58 EDT Ventricular Rate:  135 PR Interval:  160 QRS Duration: 116 QT Interval:  312 QTC Calculation: 468 R Axis:   -29 Text Interpretation:  Sinus tachycardia Left ventricular hypertrophy with  QRS widening and repolarization abnormality Abnormal ECG ST depression in  lateral leads, elevation in AVR Confirmed by Debby Freiberg [phone]) on  04/23/2015 11:17:54 AM      MDM   Final diagnoses:  Pulmonary edema    Patient presented rest for distress. Room air sats in the 60s. BiPAP started an initial blood pressure was around 190/100. Sublingual nitroglycerin was given patient improved somewhat on the BiPAP and was no longer at risk for emergent intubation. Discussed with cardiology and critical care. Nitro drip started. IV Lasix given. Also found to have urinary tract infection and antibiotics given. Will admit  to cardiology.  CRITICAL CARE Performed by: Mackie Pai Total critical care time: 30 Critical care time was exclusive of separately billable procedures and treating other patients. Critical care was necessary to treat or prevent imminent or life-threatening deterioration. Critical care was time spent personally by me on the following activities: development of treatment plan with patient and/or surrogate as well as nursing, discussions with consultants, evaluation of patient's response to treatment, examination of patient, obtaining history from patient or surrogate, ordering and performing treatments and interventions, ordering and review of laboratory studies, ordering and review of radiographic studies, pulse oximetry and re-evaluation of patient's condition.     Davonna Belling, MD 04/23/15 [phone]

## 2015-04-23 NOTE — ED Notes (Signed)
CBG 254 

## 2015-04-23 NOTE — Consult Note (Signed)
PULMONARY / CRITICAL CARE MEDICINE   Name: [name] [name] MRN: [id number] DOB: [birth year]    ADMISSION DATE:  04/23/2015 CONSULTATION DATE:  04/23/15  REFERRING MD :  Dr. Marlou Porch   CHIEF COMPLAINT:  Pulmonary Edema, Respiratory Distress   INITIAL PRESENTATION: 81 y/o M with PMH of HTN, CAD, HLD, diet controlled DM and chronic systolic CHF (EF 32%) who presented to [facility] ER on 5/19 with complaints of shortness of breath and hypertension (199/91).  CXR on presentation was consistent with pulmonary edema.  PCCM consulted for evaluation.   STUDIES:  5/19  CXR >> mild central vascular congestion, R>L  SIGNIFICANT EVENTS: 5/19  Admit with SOB.  Found to be hypertensive with pulmonary edema and UTI.  Placed on bipap w improvement.    HISTORY OF PRESENT ILLNESS:  80 y/o M with PMH of HTN, CAD, HLD, NSTEMI, PAF (on Eliquis), diet controlled DM and chronic systolic CHF (EF 20%) who presented to [facility] ER on 5/19 with complaints of shortness of breath and hypertension (199/91).   The patient & wife report he had felt a little run down the past few days but was continuing to do normal activities.  He worked in the yard etc. He was seen in the cardiology office on 4/4 with complaints of dyspnea, PND, orthopnea and lower extremity edema.  Initial ER evaluation the patient was lethargic and placed on bipap.  CXR was consistent with pulmonary edema. He was significantly hypertensive with systolic BP ~ 254.  He was treated with IV NTG, 80mg  IV lasix with improvement in respiratory status. Labs notable for BNP 1110, sr cr 1.50 (up from baseline of 1), troponin 0.02, glucose 288, WBC 15.3, and hgb 16.3.  ABG 7.323 / 41/ 153 /21.7.  UA with TNTC WBC's, large leuks.  He was admitted per Cardiology and PCCM consulted to evaluate for respiratory distress.   PAST MEDICAL HISTORY :   has a past medical history of Hypertension; HLD (hyperlipidemia); NSTEMI (non-ST elevated myocardial infarction); Type II diabetes mellitus;  Paroxysmal atrial fibrillation; Chronic systolic CHF (congestive heart failure); and CAD (coronary artery disease).  has past surgical history that includes Appendectomy; Total knee arthroplasty (Left, 2013); Tonsillectomy; Joint replacement; Cardiac catheterization (11/24/2014); and left heart catheterization with coronary angiogram (N/A, 11/24/2014).    Prior to Admission medications   Medication Sig Start Date End Date Taking? Authorizing Provider  apixaban (ELIQUIS) 5 MG TABS tablet Take 1 tablet (5 mg total) by mouth 2 (two) times daily. 11/26/14  Yes Eileen Stanford, PA-C  aspirin EC 81 MG tablet Take 81 mg by mouth daily.   Yes Historical Provider, MD  atorvastatin (LIPITOR) 40 MG tablet Take 1 tablet (40 mg total) by mouth daily at 6 PM. 11/26/14  Yes Eileen Stanford, PA-C  metFORMIN (GLUCOPHAGE) 1000 MG tablet Take 1 tablet (1,000 mg total) by mouth 2 (two) times daily with a meal. 11/27/14  Yes Eileen Stanford, PA-C  metoprolol tartrate (LOPRESSOR) 25 MG tablet Take 1 tablet (25 mg total) by mouth 2 (two) times daily. 11/26/14  Yes Eileen Stanford, PA-C  potassium chloride SA (K-DUR,KLOR-CON) 20 MEQ tablet Take 1 tablet (20 mEq total) by mouth daily. 11/26/14  Yes Eileen Stanford, PA-C  ramipril (ALTACE) 10 MG capsule Take 10 mg by mouth daily.   Yes Historical Provider, MD  furosemide (LASIX) 40 MG tablet Take 1 tablet (40 mg total) by mouth daily. 11/26/14   Eileen Stanford, PA-C  nitroGLYCERIN (NITROSTAT) 0.4 MG SL  tablet Place 1 tablet (0.4 mg total) under the tongue every 5 (five) minutes x 3 doses as needed for chest pain. 11/26/14   Eileen Stanford, PA-C  tamsulosin (FLOMAX) 0.4 MG CAPS capsule Take 0.4 mg by mouth daily as needed (BPH).    Historical Provider, MD   No Known Allergies  FAMILY HISTORY:  has no family status information on file.    SOCIAL HISTORY:  reports that he has never smoked. He has never used smokeless tobacco. He reports that he  drinks about 8.4 oz of alcohol per week. He reports that he does not use illicit drugs.  REVIEW OF SYSTEMS:  Pt on bipap, unable to complete ROS.  Information obtained from previous medical documentation and family at bedside (wife and son).   SUBJECTIVE:   VITAL SIGNS: Temp:  [97.6 F (36.4 C)] 97.6 F (36.4 C) (05/19 1128) Pulse Rate:  [93-127] 93 (05/19 1300) Resp:  [24-36] 24 (05/19 1300) BP: (100-199)/(57-91) 111/63 mmHg (05/19 1300) SpO2:  [69 %-99 %] 97 % (05/19 1300) FiO2 (%):  [50 %] 50 % (05/19 1129)   HEMODYNAMICS:     VENTILATOR SETTINGS: Vent Mode:  [-] BIPAP FiO2 (%):  [50 %] 50 % Set Rate:  [14 bmp] 14 bmp   INTAKE / OUTPUT: No intake or output data in the 24 hours ending 04/23/15 1320  PHYSICAL EXAMINATION: General:  wdwn elderly male in NAD  Neuro:  Sleepy, arouses, appropriate, MAE  HEENT:  MM pink/moist, no jvd Cardiovascular:  s1s2 rrr, no m/r/g Lungs:  resp's even/non-labored, lungs bilaterally with basilar crackles  Abdomen:  Obese/soft, NT, bsx4 active  Musculoskeletal:  No acute deformities Skin:  Warm/dry, trace edema (1+ pitting)  LABS:  CBC  Recent Labs Lab 04/23/15 1115 04/23/15 1128  WBC 15.3*  --   HGB 14.2 16.3  HCT 44.7 48.0  PLT 196  --    Coag's No results for input(s): APTT, INR in the last 168 hours.   BMET  Recent Labs Lab 04/23/15 1115 04/23/15 1128  NA 135 137  K 4.0 4.0  CL 101 103  CO2 19*  --   BUN 17 20  CREATININE 1.64* 1.50*  GLUCOSE 276* 288*   Electrolytes  Recent Labs Lab 04/23/15 1115  CALCIUM 9.1   Sepsis Markers No results for input(s): LATICACIDVEN, PROCALCITON, O2SATVEN in the last 168 hours.   ABG  Recent Labs Lab 04/23/15 1233  PHART 7.323*  PCO2ART 41.8  PO2ART 153.0*   Liver Enzymes  Recent Labs Lab 04/23/15 1115  AST 26  ALT 15*  ALKPHOS 60  BILITOT 1.5*  ALBUMIN 3.5   Cardiac Enzymes No results for input(s): TROPONINI, PROBNP in the last 168 hours.    Glucose  Recent Labs Lab 04/23/15 1131  GLUCAP 254*    Imaging No results found.   ASSESSMENT / PLAN:  PULMONARY OETT A: Dyspnea / Acute Respiratory Distress  Pulmonary Edema - in setting of hypertensive emergency  P:   PRN BiPAP support Likely can remove BiPAP once transported to unit / settled in bed  Wean O2 for sats > 90% Pulmonary hygiene Assess ABG (reviewed) Repeat CXR in am to monitor for clearance of edema & no development of infiltrate  CARDIOVASCULAR CVL A:  Hypertensive Emergency - elevated sr cr, initial BP ~902 systolic  Hx HTN, HLD, CAD PAF - on Eliquis  Chronic Systolic CHF (EF 40%) P:  BP control per primary - continue lopressor Continue eliquis, atorvastatin Hold ACE-I for  now Trend troponin, BNP Lasix per Cardiology  RENAL A:   AKI - in setting of UTI, hypertensive emergency + ACE-I P:   Trend BMP  Replace electrolytes as indicated   GASTROINTESTINAL A:   Obesity  P:  NPO for now, ok for heart healthy diet once off bipap   HEMATOLOGIC A:   Leukocytosis  P:  Trend CBC Eliquis for anticoagulation   INFECTIOUS A:   UTI  P:   UC 5/19 >>   Rocephin, start date 5/19, day 1/x  ENDOCRINE A:   Diet Controlled DM II    P:   Monitor glucose on BMP   NEUROLOGIC A:   Acute Encephalopathy - resolving, in setting of respiratory distress, UTI, hypertensive emergency  P:   Monitor / supportive care   FAMILY  - Updates: Wife and son updated at bedside.     Noe Gens, NP-C [facility] Pgr: [phone] or [phone] 04/23/2015, 54:68 PM  81 year old male with extensive cardiac history presenting with acute respiratory failure due to acute pulmonary edema for a hypertensive crisis.  Patient was placed on BiPAP and diureses started.  Currently remains tachypnic and hypertensive.  NTG drip to be started, continue BiPAP for acute respiratory failure and diureses as ordered.  Difficult to ascertain if there is  an infiltrate or strictly pulmonary edema.  Patient has a UTI as well.  Will start on rocephin that should cover both.  Admit to the ICU and will continue to follow a patient diureses.  Diffuse crackles on exam.   The patient is critically ill with multiple organ systems failure and requires high complexity decision making for assessment and support, frequent evaluation and titration of therapies, application of advanced monitoring technologies and extensive interpretation of multiple databases.   Critical Care Time devoted to patient care services described in this note is  35  Minutes. This time reflects time of care of this signee Dr Jennet Maduro. This critical care time does not reflect procedure time, or teaching time or supervisory time of PA/NP/Med student/Med Resident etc but could involve care discussion time.  Rush Farmer, M.D. [facility] Pulmonary/Critical Care Medicine. Pager: [phone]. After hours pager: [phone].

## 2015-04-23 NOTE — H&P (Signed)
Admit date: 04/23/2015 Primary Physician  Stephens Shire, MD Primary Cardiologist  Dr. Meda Coffee  CC: Dyspnea  HPI: 81 year old male with ejection fraction of 45%, CAD, CTO of circumflex system with collaterals, paroxysmal atrial fibrillation, diabetes, obesity, hypertension who presented with shortness of breath, severe, respiratory failure in the emergency department with chest x-ray demonstrating interstitial edema, physical exam demonstrating rhonchi bilaterally. Somnolent, placed on BiPAP. Difficult to obtain a full history however he denies any prior history of lung disease. Denies chest pain.  He was recently seen in our office on 03/09/15 and in review of this note, he had a presentation of dyspnea, PND, orthopnea and edema. His wife was present on this visit and stated that he never complained. He has been on chronic anticoagulation with Eliquis for atrial fibrillation. A previous attempt was made at CTO of obtuse marginal but unsuccessful.  Here in the emergency department he was quite somnolent and on most required intubation initially. BiPAP was placed and he is able to answer a few questions. His wife just left to go turn a burner off at home.  PMH:   Past Medical History  Diagnosis Date  . Hypertension   . HLD (hyperlipidemia)   . NSTEMI (non-ST elevated myocardial infarction)   . Type II diabetes mellitus   . Paroxysmal atrial fibrillation     a. diagnosed on 11/2014 admission. Spontaneously converted to NSR. Placed on Eliquis  . Chronic systolic CHF (congestive heart failure)     a. 2d ECHO 11/24/14 with EF 40-45%, akinesis of the inferior, inferolateral and inferoseptal walls; overall mild to moderate reduction in LV function; grade 2 diastolic dysfunction; severe LAE; trace AI; moderate MR; mild TR with severely elevated pulmonary pressures.  Marland Kitchen CAD (coronary artery disease)     a. s/p LHC on 11/24/14 w/ mild-mod dz in LAD. CTO of mLCx and OM2 with R-->L and L-->L  collaterals.     PSH:   Past Surgical History  Procedure Laterality Date  . Appendectomy    . Total knee arthroplasty Left 2013  . Tonsillectomy    . Joint replacement    . Cardiac catheterization  11/24/2014  . Left heart catheterization with coronary angiogram N/A 11/24/2014    Procedure: LEFT HEART CATHETERIZATION WITH CORONARY ANGIOGRAM;  Surgeon: Jettie Booze, MD;  Location: [facility] CATH LAB;  Service: Cardiovascular;  Laterality: N/A;   Allergies:  Review of patient's allergies indicates no known allergies. Prior to Admit Meds:   Prior to Admission medications   Medication Sig Start Date End Date Taking? Authorizing Provider  apixaban (ELIQUIS) 5 MG TABS tablet Take 1 tablet (5 mg total) by mouth 2 (two) times daily. 11/26/14  Yes Eileen Stanford, PA-C  aspirin EC 81 MG tablet Take 81 mg by mouth daily.   Yes Historical Provider, MD  atorvastatin (LIPITOR) 40 MG tablet Take 1 tablet (40 mg total) by mouth daily at 6 PM. 11/26/14  Yes Eileen Stanford, PA-C  metFORMIN (GLUCOPHAGE) 1000 MG tablet Take 1 tablet (1,000 mg total) by mouth 2 (two) times daily with a meal. 11/27/14  Yes Eileen Stanford, PA-C  metoprolol tartrate (LOPRESSOR) 25 MG tablet Take 1 tablet (25 mg total) by mouth 2 (two) times daily. 11/26/14  Yes Eileen Stanford, PA-C  potassium chloride SA (K-DUR,KLOR-CON) 20 MEQ tablet Take 1 tablet (20 mEq total) by mouth daily. 11/26/14  Yes Eileen Stanford, PA-C  ramipril (ALTACE) 10 MG capsule Take 10 mg by mouth daily.  Yes Historical Provider, MD  furosemide (LASIX) 40 MG tablet Take 1 tablet (40 mg total) by mouth daily. 11/26/14   Eileen Stanford, PA-C  nitroGLYCERIN (NITROSTAT) 0.4 MG SL tablet Place 1 tablet (0.4 mg total) under the tongue every 5 (five) minutes x 3 doses as needed for chest pain. 11/26/14   Eileen Stanford, PA-C  tamsulosin (FLOMAX) 0.4 MG CAPS capsule Take 0.4 mg by mouth daily as needed (BPH).    Historical Provider, MD    Fam HX:    Family History  Problem Relation Age of Onset  . Cancer Father 12    Deceased  . Stroke Mother     Deceased  . Heart attack Brother     Deceased. Had cath with stents followed by PE   Social HX:    History   Social History  . Marital Status: Married    Spouse Name: N/A  . Number of Children: N/A  . Years of Education: N/A   Occupational History  . Not on file.   Social History Main Topics  . Smoking status: Never Smoker   . Smokeless tobacco: Never Used  . Alcohol Use: 8.4 oz/week    7 Standard drinks or equivalent, 7 Shots of liquor per week  . Drug Use: No  . Sexual Activity: Not Currently   Other Topics Concern  . Not on file   Social History Narrative     ROS:  Difficult to obtain secondary to current somnolence. Positive for shortness of breath. No chest pain. No history of lung disease.   Physical Exam: Blood pressure 132/67, pulse 110, temperature 97.6 F (36.4 C), temperature source Temporal, resp. rate 33, SpO2 97 %.   General: Well developed, well nourished, respiratory moderate acute distress Head: Eyes PERRLA, No xanthomas.   Normal cephalic and atramatic  Lungs:  Rhonchorous breath sounds bilaterally.Marland Kitchen Heart:  Mildly tachycardic S1 S2 Pulses are 2+ & equal. No murmurs, rubs or gallops.             No carotid bruit. No JVD.  No abdominal bruits. Abdomen: Bowel sounds are positive, abdomen soft and non-tender without masses or                 Hernia's noted. No hepatosplenomegaly. Obese, protuberant Msk:  Back normal. Unable to assess strength  Extremities:  No clubbing, cyanosis, 1+ bilateral lower extremity edema.  DP +1 Neuro: Fairly somnolent, non-focal, MAE x 4 GU: Deferred Rectal: Deferred Psych:  Previously flat affect in clinic         Labs:   Lab Results  Component Value Date   WBC 15.3* 04/23/2015   HGB 16.3 04/23/2015   HCT 48.0 04/23/2015   MCV 94.1 04/23/2015   PLT 196 04/23/2015    Recent Labs Lab  04/23/15 1128  NA 137  K 4.0  CL 103  BUN 20  CREATININE 1.50*  GLUCOSE 288*   No results for input(s): CKTOTAL, CKMB, TROPONINI in the last 72 hours. Lab Results  Component Value Date   CHOL 173 11/23/2014   HDL 48 11/23/2014   LDLCALC 89 11/23/2014   TRIG 182* 11/23/2014   No results found for: [facility]   Radiology:  Marked interstitial edema right greater than left Personally viewed.   EKG:  Sinus tachycardia heart rate 111 with left anterior fascicular block and nonspecific ST changes Personally viewed.  Echocardiogram 03/05/15: - Left ventricle: The cavity size was normal. Systolic function was mildly to moderately reduced. The  estimated ejection fraction was in the range of 40% to 45%. There is hypokinesis of the inferior myocardium. Features are consistent with a pseudonormal left ventricular filling pattern, with concomitant abnormal relaxation and increased filling pressure (grade 2 diastolic dysfunction). - Aortic valve: There was mild regurgitation. - Mitral valve: Calcified annulus. There was moderate regurgitation. - Left atrium: The atrium was moderately dilated. - Right ventricle: The cavity size was mildly dilated. Wall thickness was normal. - Pulmonary arteries: Systolic pressure was moderately increased. PA peak pressure: 41 mm Hg (S).  Cardiac catheterization 11/24/14:  IMPRESSIONS:  1. Widely patent left main coronary artery. 2. Mild to moderate disease in the left anterior descending artery and its branches. 3. 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 There are right to left and left to left collaterals which fill this large OM 2 and distal circumflex system. 4. Moderate disease in the mid right coronary artery. 5. Left ventricular systolic function was not assessed. LVEDP 12 mmHg.  RECOMMENDATION: Continue medical therapy. Should be able to start novel oral anticoagulant such as Eliquis tomorrow.  If he has further symptoms of angina, would bring him back for CTO PCI of the mid circumflex. The lesion itself is favorable for PCI. I think we would have better support from a femoral approach. He will follow-up with Dr. Meda Coffee. If symptoms persist, I can see him as well. Anticipate discharge tomorrow.  LVEDP was normal. Will hold Lasix today. He would likely need to go home on a low dose of oral Lasix.   ASSESSMENT/PLAN:   81 year old male with acute respiratory failure, acute on chronic systolic/diastolic heart failure,chronic kidney disease stage III, leukocytosis 15.3, paroxysmal atrial fibrillation currently in sinus rhythm, chronic anticoagulation, obesity.  1. Acute on chronic respiratory failure/acute on chronic systolic/diastolic heart failure-presumed pulmonary edema on chest x-ray. Leukocytosis may very well be reactive from current stress. Spoke to Dr. Alvino Chapel, he will order blood gas. I've also asked him to consult pulmonary/critical-care medicine given his tenuous situation, may require intubation. Appreciate their assistance and any other further thoughts or treatment strategies. For now, focus on IV diuresis, Lasix 80 mg given. We will continue. Troponin thus far is normal. Would not be surprised if troponin becomes elevated given his current status. Ejection fraction only mildly reduced at 40-45% previously. I will check an echocardiogram to make sure that this has not markedly changed.  2. Paroxysmal atrial fibrillation-maintaining sinus rhythm currently. Continue with anticoagulation, Eliquis. Continue with metoprolol.  3. Hypertensive emergency-when first arrived in ER, blood pressure in the 620 systolic range. IV nitroglycerin has reduced this. IV Lasix. Currently in the 355 systolic range.  4. Hyperlipidemia-continue with atorvastatin  5. Coronary artery disease-CTO of obtuse marginal-unsuccessful wire across in December 2015. Moderate LAD disease.  Overall concerning  respiratory failure likely from interstitial edema. Hopeful resolution with aggressive IV diuresis, BiPAP. Await ABG results. Appreciate pulmonary assistance as well.  Candee Furbish, MD  04/23/2015  12:18 PM

## 2015-04-23 NOTE — ED Notes (Signed)
Pt remains on bipap, respiratory status improved. Pt comfortable, resting in bed.

## 2015-04-23 NOTE — ED Notes (Signed)
Respiratory at bedside, preparing for bipap.

## 2015-04-24 ENCOUNTER — Inpatient Hospital Stay (HOSPITAL_COMMUNITY): Payer: Medicare HMO

## 2015-04-24 DIAGNOSIS — J9600 Acute respiratory failure, unspecified whether with hypoxia or hypercapnia: Secondary | ICD-10-CM

## 2015-04-24 DIAGNOSIS — I5021 Acute systolic (congestive) heart failure: Secondary | ICD-10-CM

## 2015-04-24 DIAGNOSIS — I5043 Acute on chronic combined systolic (congestive) and diastolic (congestive) heart failure: Principal | ICD-10-CM

## 2015-04-24 DIAGNOSIS — I214 Non-ST elevation (NSTEMI) myocardial infarction: Secondary | ICD-10-CM

## 2015-04-24 LAB — CBC
HCT: 39.7 % (ref 39.0–52.0)
HEMOGLOBIN: 13 g/dL (ref 13.0–17.0)
MCH: 29.9 pg (ref 26.0–34.0)
MCHC: 32.7 g/dL (ref 30.0–36.0)
MCV: 91.3 fL (ref 78.0–100.0)
PLATELETS: 157 10*3/uL (ref 150–400)
RBC: 4.35 MIL/uL (ref 4.22–5.81)
RDW: 14.1 % (ref 11.5–15.5)
WBC: 8.8 10*3/uL (ref 4.0–10.5)

## 2015-04-24 LAB — BASIC METABOLIC PANEL
Anion gap: 10 (ref 5–15)
BUN: 25 mg/dL — AB (ref 6–20)
CO2: 26 mmol/L (ref 22–32)
CREATININE: 1.59 mg/dL — AB (ref 0.61–1.24)
Calcium: 9 mg/dL (ref 8.9–10.3)
Chloride: 100 mmol/L — ABNORMAL LOW (ref 101–111)
GFR calc non Af Amer: 39 mL/min — ABNORMAL LOW (ref >60)
GFR, EST AFRICAN AMERICAN: 45 mL/min — AB (ref >60)
GLUCOSE: 126 mg/dL — AB (ref 65–99)
POTASSIUM: 4 mmol/L (ref 3.5–5.1)
Sodium: 136 mmol/L (ref 135–145)

## 2015-04-24 LAB — GLUCOSE, CAPILLARY
GLUCOSE-CAPILLARY: 134 mg/dL — AB (ref 65–99)
GLUCOSE-CAPILLARY: 111 mg/dL — AB (ref 65–99)
GLUCOSE-CAPILLARY: 106 mg/dL — AB (ref 65–99)
Glucose-Capillary: 139 mg/dL — ABNORMAL HIGH (ref 65–99)
Glucose-Capillary: 115 mg/dL — ABNORMAL HIGH (ref 65–99)

## 2015-04-24 LAB — TROPONIN I: Troponin I: 1.39 ng/mL (ref <0.031)

## 2015-04-24 MED ORDER — AMLODIPINE BESYLATE 2.5 MG PO TABS
2.5000 mg | ORAL_TABLET | Freq: Every day | ORAL | Status: DC
  Administered 2015-04-24 – 2015-04-25 (×2): 2.5 mg via ORAL
  Filled 2015-04-24 (×2): qty 1

## 2015-04-24 MED ORDER — FUROSEMIDE 40 MG PO TABS
40.0000 mg | ORAL_TABLET | Freq: Two times a day (BID) | ORAL | Status: DC
  Administered 2015-04-24 – 2015-04-25 (×3): 40 mg via ORAL
  Filled 2015-04-24 (×4): qty 1

## 2015-04-24 NOTE — Progress Notes (Signed)
Markedly improved. Under care of cardiology service I have ordered stop of ceftriaxone after 3 doses for uncomplicated UTI There seems to be nothing further for PCCM to add PCCM will sign off. Please call if we can be of further assistance  Merton Border, MD ; [facility] [phone].  After 5:30 PM or weekends, call [phone]

## 2015-04-24 NOTE — Progress Notes (Signed)
Cardiology Progress Note  Subjective: No acute events overnight.   [name] [name] was seen and examined this morning.  He has not required BiPAP since yesterday afternoon.  He denies CP or dyspnea.  He just had breakfast.    Objective: Vital signs in last 24 hours: Filed Vitals:   04/24/15 0300 04/24/15 0400 04/24/15 0500 04/24/15 0600  BP: 137/61 118/67 155/74 163/80  Pulse: 62 58 57 68  Temp:  97.4 F (36.3 C)    TempSrc:  Oral    Resp: 17 13 17 12   Height:      Weight:  209 lb 11.2 oz (95.119 kg)    SpO2: 94% 94% 93% 96%   Weight change:   Intake/Output Summary (Last 24 hours) at 04/24/15 0740 Last data filed at 04/24/15 0400  Gross per 24 hour  Intake      3 ml  Output   4250 ml  Net  -4247 ml   General: resting in bed in NAD watching tv HEENT: [place]/AT, [place] in place, no JVD or carotid bruits Cardiac: RRR, no rubs, murmurs or gallops; distal pulses intact Pulm: clear to auscultation bilaterally, no wheezes/rales/rhonchi, moving normal volumes of air Abd: soft, nontender, nondistended, BS present Ext: warm and well perfused, no pedal edema Neuro: alert and oriented X3, responding appropriately, MAE spontaneously  Telemetry:  NSR 70s  Lab Results: Basic Metabolic Panel:  Recent Labs Lab 04/23/15 1115 04/23/15 1128 04/24/15 0353  NA 135 137 136  K 4.0 4.0 4.0  CL 101 103 100*  CO2 19*  --  26  GLUCOSE 276* 288* 126*  BUN 17 20 25*  CREATININE 1.64* 1.50* 1.59*  CALCIUM 9.1  --  9.0   Liver Function Tests:  Recent Labs Lab 04/23/15 1115  AST 26  ALT 15*  ALKPHOS 60  BILITOT 1.5*  PROT 7.2  ALBUMIN 3.5   CBC:  Recent Labs Lab 04/23/15 1115 04/23/15 1128 04/24/15 0353  WBC 15.3*  --  8.8  NEUTROABS 10.6*  --   --   HGB 14.2 16.3 13.0  HCT 44.7 48.0 39.7  MCV 94.1  --  91.3  PLT 196  --  157   Cardiac Enzymes:  Recent Labs Lab 04/23/15 1855 04/23/15 2150 04/24/15 0353  TROPONINI 1.50* 1.45* 1.39*   CBG:  Recent Labs Lab  04/23/15 1131 04/23/15 1646 04/23/15 2108  GLUCAP 254* 140* 139*   Urinalysis    Component Value Date/Time   COLORURINE YELLOW 04/23/2015 1220   APPEARANCEUR TURBID* 04/23/2015 1220   LABSPEC 1.016 04/23/2015 1220   PHURINE 5.0 04/23/2015 1220   GLUCOSEU 250* 04/23/2015 1220   HGBUR LARGE* 04/23/2015 1220   BILIRUBINUR NEGATIVE 04/23/2015 1220   KETONESUR NEGATIVE 04/23/2015 1220   PROTEINUR 100* 04/23/2015 1220   UROBILINOGEN 1.0 04/23/2015 1220   NITRITE NEGATIVE 04/23/2015 1220   LEUKOCYTESUR LARGE* 04/23/2015 1220  urine WBC - TNTC  Studies/Results: Dg Chest Port 1 View  04/24/2015   CLINICAL DATA:  Pulmonary edema, history of CHF, coronary artery disease, and diabetes  EXAM: PORTABLE CHEST - 1 VIEW  COMPARISON:  Portable chest x-ray of Apr 23, 2015  FINDINGS: The lungs are adequately inflated. The interstitial markings remain increased but have improved. The retrocardiac region on the left remains dense but is less conspicuous. The left hemidiaphragm remains largely obscured. The cardiac silhouette is enlarged. The central pulmonary vascularity is engorged.  IMPRESSION: CHF with mild interval improvement in the appearance of the pulmonary interstitium.   Electronically  Signed   By: David  Martinique M.D.   On: 04/24/2015 07:22   Dg Chest Portable 1 View  04/23/2015   CLINICAL DATA:  Hypertension, diabetes, respiratory distress  EXAM: PORTABLE CHEST - 1 VIEW  COMPARISON:  11/23/2014  FINDINGS: Cardiomegaly again noted. Central mild vascular congestion. Mild perihilar and infrahilar interstitial prominence suspicious for interstitial edema. No segmental infiltrate  IMPRESSION: Central mild vascular congestion. Mild perihilar and infrahilar interstitial prominence suspicious for interstitial edema. No segmental infiltrate .   Electronically Signed   By: Lahoma Crocker M.D.   On: 04/23/2015 12:34   Medications: I have reviewed the patient's current medications. Scheduled Meds: . antiseptic  oral rinse  7 mL Mouth Rinse BID  . apixaban  5 mg Oral BID  . aspirin EC  81 mg Oral Daily  . atorvastatin  40 mg Oral q1800  . cefTRIAXone (ROCEPHIN)  IV  1 g Intravenous Q24H  . furosemide  80 mg Intravenous TID  . insulin aspart  0-15 Units Subcutaneous TID WC  . metoprolol tartrate  25 mg Oral BID  . potassium chloride SA  20 mEq Oral Daily  . ramipril  10 mg Oral Daily  . sodium chloride  3 mL Intravenous Q12H   Continuous Infusions: none PRN Meds:.sodium chloride, acetaminophen, nitroGLYCERIN, ondansetron (ZOFRAN) IV, sodium chloride, tamsulosin  Assessment/Plan:  Acute respiratory failure due to acute on chronic systolic and diastolic heart failure (NYHA class 4):  Required BiPAP overnight, net neg 4.2L and wt down ?10 pounds after 80mg  IV Lasix TID yesterday, now breathing comfortably on 2L via [place].  Minimal improvement in CXR.   - stop IV Lasix and start 40mg  po Lasix BID - continue BB, ACEI - strict Is & Os and daily weights - BiPAP, supplemental oxygen prn - awaiting 2D ECHO  Paroxysmal atrial fibrillation:  Currently in NSR. - continue Eliquis and BB - continue telemetry monitoring    CAD (coronary artery disease):  He had a LHC 11/2014 which revealed patent left main, mild to moderate disease in LAD and its branches, CTO of the mid LCX and OM2 (with R to L and L to L collaterals), moderate disease in the mid RCA.  Recommendations at that time were for continued medical therapy and to perform PCI to the CTO of mid LCX if he had further chest pain.  The patient has no chest pain this admission.  Troponin elevation (0.02 -->1.50-->1.45-->1.39) likely due to acute respiratory failure, HTN emergency, AKI. - continue medical therapy:  ASA, statin, BB, ACEI  HLD (hyperlipidemia):  continue Lipitor 40mg  daily   Hypertensive emergency:  Resolved.  Initial BP in ED 199/49mmHg with AKI noted.  Improved with NTG gtt which was only required for a short time (about 45 min).  BPs  better. - add amlodipine 2.5mg  daily - continue close BP monitoring  AKI: Cr 1.64 -->1.59 this admission.  Baseline is around 1.  This is likely pre-renal due to decompensated HF and HTN contributing.  He has had improvement in Cr with diuresis. - continue diuresis as above - monitor renal function  Diabetes mellitus type 2:  Last A1c was 6.4 in 11/2014 indication good control.  Patient is on metformin 1000mg  BID at home.   - hold metformin during admission - check CBGs ac/hs - SSI-M - repeat Hgb A1c  UTI:  IV Ceftriaxone x 3 days  Dispo:  He is stable for transfer to telemetry.   LOS: 1 day   [name] Oman, DO  [facility] PGY2 on CCU Service 04/24/2015, 7:40 AM   The patient was seen, examined and discussed with [name] Oman, DO and I agree with the above.   Please see separate note.  [name] [name] 04/24/2015

## 2015-04-24 NOTE — Progress Notes (Signed)
Patient Name: [name] [name] Date of Encounter: 04/24/2015  Principal Problem:   Acute respiratory failure Active Problems:   Diabetes mellitus type 2 in obese   Paroxysmal atrial fibrillation   HLD (hyperlipidemia)   CAD (coronary artery disease)   Acute on chronic systolic and diastolic heart failure, NYHA class 4   Hypertensive urgency   Acute systolic heart failure   Length of Stay: 1  SUBJECTIVE  The patient feels significantly better, improved SOB, no chest pain.  CURRENT MEDS . antiseptic oral rinse  7 mL Mouth Rinse BID  . apixaban  5 mg Oral BID  . aspirin EC  81 mg Oral Daily  . atorvastatin  40 mg Oral q1800  . cefTRIAXone (ROCEPHIN)  IV  1 g Intravenous Q24H  . furosemide  80 mg Intravenous TID  . insulin aspart  0-15 Units Subcutaneous TID WC  . metoprolol tartrate  25 mg Oral BID  . potassium chloride SA  20 mEq Oral Daily  . ramipril  10 mg Oral Daily  . sodium chloride  3 mL Intravenous Q12H     OBJECTIVE  Filed Vitals:   04/24/15 0500 04/24/15 0600 04/24/15 0700 04/24/15 0800  BP: 155/74 163/80 146/77   Pulse: 57 68 69   Temp:    97.7 F (36.5 C)  TempSrc:    Oral  Resp: 17 12 18    Height:      Weight:      SpO2: 93% 96% 89%     Intake/Output Summary (Last 24 hours) at 04/24/15 0843 Last data filed at 04/24/15 0800  Gross per 24 hour  Intake      3 ml  Output   4500 ml  Net  -4497 ml   Filed Weights   04/23/15 1530 04/24/15 0400  Weight: 219 lb 9.3 oz (99.6 kg) 209 lb 11.2 oz (95.119 kg)   PHYSICAL EXAM  General: Pleasant, NAD. Neuro: Alert and oriented X 3. Moves all extremities spontaneously. Psych: Normal affect. HEENT:  Normal  Neck: Supple without bruits or JVD. Lungs:  Resp regular and unlabored, CTA. Heart: RRR no s3, s4, or murmurs. Abdomen: Soft, non-tender, non-distended, BS + x 4.  Extremities: No clubbing, cyanosis or edema. DP/PT/Radials 2+ and equal bilaterally.  Accessory Clinical Findings  CBC  Recent  Labs  04/23/15 1115 04/23/15 1128 04/24/15 0353  WBC 15.3*  --  8.8  NEUTROABS 10.6*  --   --   HGB 14.2 16.3 13.0  HCT 44.7 48.0 39.7  MCV 94.1  --  91.3  PLT 196  --  127   Basic Metabolic Panel  Recent Labs  04/23/15 1115 04/23/15 1128 04/24/15 0353  NA 135 137 136  K 4.0 4.0 4.0  CL 101 103 100*  CO2 19*  --  26  GLUCOSE 276* 288* 126*  BUN 17 20 25*  CREATININE 1.64* 1.50* 1.59*  CALCIUM 9.1  --  9.0   Liver Function Tests  Recent Labs  04/23/15 1115  AST 26  ALT 15*  ALKPHOS 60  BILITOT 1.5*  PROT 7.2  ALBUMIN 3.5   Cardiac Enzymes  Recent Labs  04/23/15 1855 04/23/15 2150 04/24/15 0353  TROPONINI 1.50* 1.45* 1.39*   Radiology/Studies  Dg Chest Port 1 View  04/24/2015   CLINICAL DATA:  Pulmonary edema, history of CHF, coronary artery disease, and diabetes  EXAM: PORTABLE CHEST - 1 VIEW  COMPARISON:  Portable chest x-ray of Apr 23, 2015  FINDINGS: The lungs are adequately  inflated. The interstitial markings remain increased but have improved. The retrocardiac region on the left remains dense but is less conspicuous. The left hemidiaphragm remains largely obscured. The cardiac silhouette is enlarged. The central pulmonary vascularity is engorged.  IMPRESSION: CHF with mild interval improvement in the appearance of the pulmonary interstitium.   Electronically Signed   By: David  Martinique M.D.   On: 04/24/2015 07:22   Dg Chest Portable 1 View  04/23/2015   CLINICAL DATA:  Hypertension, diabetes, respiratory distress  EXAM: PORTABLE CHEST - 1 VIEW  COMPARISON:  11/23/2014  FINDINGS: Cardiomegaly again noted. Central mild vascular congestion. Mild perihilar and infrahilar interstitial prominence suspicious for interstitial edema. No segmental infiltrate  IMPRESSION: Central mild vascular congestion. Mild perihilar and infrahilar interstitial prominence suspicious for interstitial edema. No segmental infiltrate .   Electronically Signed   By: Lahoma Crocker M.D.   On:  04/23/2015 12:34   TELE: SR    ASSESSMENT AND PLAN  81 year old male with acute respiratory failure, acute on chronic systolic/diastolic heart failure,chronic kidney disease stage III, leukocytosis 15.3, paroxysmal atrial fibrillation currently in sinus rhythm, chronic anticoagulation, obesity.  1. Acute on chronic respiratory failure/acute on chronic systolic/diastolic heart failure-presumed pulmonary edema on chest x-ray. Sec to HTN emergency.  He diuresed 4.4 Liters overnight, feeling significantly better.  Current weight 209 lbs, baseline 218.  We will switch lasix to 40 mg po BID.   Ejection fraction only mildly reduced at 40-45% previously. I will check an echocardiogram to make sure that this has not markedly changed.  2. Paroxysmal atrial fibrillation-maintaining sinus rhythm currently. Continue with anticoagulation, Eliquis. Continue with metoprolol.  3. Hypertensive emergency-when first arrived in ER, blood pressure in the 976 systolic range. I would add amlodipine 2.5 mg PO daily.  4. Hyperlipidemia-continue with atorvastatin  5. Coronary artery disease-CTO of obtuse marginal-unsuccessful wire across in December 2015. Moderate LAD disease. Troponin 1.5 -> 1.39, chest pain free, this is most probably sec to HTN emergency and acute on CKD.  We can consider a stress test once the patient recovers, possibly tomorrow.  He is overall much better and can be transferred to the cardiac floor.  Signed, Dorothy Spark MD, [facility] 04/24/2015

## 2015-04-25 ENCOUNTER — Inpatient Hospital Stay (HOSPITAL_COMMUNITY): Payer: Medicare HMO

## 2015-04-25 ENCOUNTER — Other Ambulatory Visit: Payer: Self-pay | Admitting: Physician Assistant

## 2015-04-25 ENCOUNTER — Encounter (HOSPITAL_COMMUNITY): Payer: Self-pay | Admitting: Physician Assistant

## 2015-04-25 DIAGNOSIS — I5043 Acute on chronic combined systolic (congestive) and diastolic (congestive) heart failure: Secondary | ICD-10-CM

## 2015-04-25 DIAGNOSIS — I509 Heart failure, unspecified: Secondary | ICD-10-CM

## 2015-04-25 LAB — BASIC METABOLIC PANEL
Anion gap: 11 (ref 5–15)
BUN: 29 mg/dL — ABNORMAL HIGH (ref 6–20)
CALCIUM: 9 mg/dL (ref 8.9–10.3)
CO2: 28 mmol/L (ref 22–32)
Chloride: 98 mmol/L — ABNORMAL LOW (ref 101–111)
Creatinine, Ser: 1.48 mg/dL — ABNORMAL HIGH (ref 0.61–1.24)
GFR calc non Af Amer: 42 mL/min — ABNORMAL LOW (ref >60)
GFR, EST AFRICAN AMERICAN: 49 mL/min — AB (ref >60)
Glucose, Bld: 131 mg/dL — ABNORMAL HIGH (ref 65–99)
Potassium: 3.8 mmol/L (ref 3.5–5.1)
SODIUM: 137 mmol/L (ref 135–145)

## 2015-04-25 LAB — GLUCOSE, CAPILLARY
GLUCOSE-CAPILLARY: 128 mg/dL — AB (ref 65–99)
Glucose-Capillary: 144 mg/dL — ABNORMAL HIGH (ref 65–99)

## 2015-04-25 MED ORDER — SPIRONOLACTONE 12.5 MG HALF TABLET
12.5000 mg | ORAL_TABLET | Freq: Every day | ORAL | Status: DC
  Filled 2015-04-25: qty 1

## 2015-04-25 MED ORDER — APIXABAN 2.5 MG PO TABS
2.5000 mg | ORAL_TABLET | Freq: Two times a day (BID) | ORAL | Status: DC
  Filled 2015-04-25: qty 1

## 2015-04-25 MED ORDER — CARVEDILOL 6.25 MG PO TABS
6.2500 mg | ORAL_TABLET | Freq: Two times a day (BID) | ORAL | Status: DC
  Filled 2015-04-25 (×2): qty 1

## 2015-04-25 MED ORDER — APIXABAN 2.5 MG PO TABS: 2.5000 mg | ORAL_TABLET | Freq: Two times a day (BID) | ORAL | Status: DC

## 2015-04-25 MED ORDER — SPIRONOLACTONE 25 MG PO TABS: 12.5000 mg | ORAL_TABLET | Freq: Every day | ORAL | Status: DC

## 2015-04-25 MED ORDER — FUROSEMIDE 40 MG PO TABS: 40.0000 mg | ORAL_TABLET | Freq: Two times a day (BID) | ORAL | Status: DC

## 2015-04-25 MED ORDER — CARVEDILOL 6.25 MG PO TABS: 6.2500 mg | ORAL_TABLET | Freq: Two times a day (BID) | ORAL | Status: DC

## 2015-04-25 NOTE — Progress Notes (Signed)
  Echocardiogram 2D Echocardiogram has been performed.  [name] [name] 04/25/2015, 9:11 AM

## 2015-04-25 NOTE — Discharge Summary (Signed)
Discharge Summary   Patient ID: [name] [name] MRN: [id number], DOB/AGE: 07-28-1930 81 y.o. Admit date: 04/23/2015 D/C date:     04/25/2015  Primary Care Provider: Stephens Shire, MD Primary Cardiologist: Meda Coffee  Primary Discharge Diagnoses:  1. Acute on chronic respiratory failure with pulmonary edema 2. Acute on chronic systolic/diastolic heart failure - EF down to 25% this admission, possibly due to HTN crisis 3. Paroxysmal atrial fibrillation on Eliquis 4. Hypertensive emergency 5. Elevated troponin, due to demand ischemia in the setting of respiratory failure and known CAD 6. CAD with CTO of circumflex system with collaterals (unsuccessful wire across in December 2015, with moderate LAD disease) 7. Hyperlipidemia 8. Acute encephalopathy, resolved 9. AKI - peak Cr 1.64, discharge Cr 1.48 10. Obesity  11. Leukocytosis 12. DM2 13. UTI  Hospital Course: [name] [name] is an 81 y/o M with chronic combined CHF (previous EF 45%), CAD with CTO of circumflex system with collaterals (unsuccessful wire across in December 2015, with moderate LAD disease), paroxysmal atrial fibrillation on Eliquis, diabetes, obesity, hypertension who presented to [facility] 04/23/2015 with shortness of breath and respiratory failure. He was somnolent and required BiPAP. It was difficult to obtain a history from him given his mental status. His wife was not present at bedside as she had left to turn off a burner at home.   Last [facility] 11/2014 which revealed patent left main, mild to moderate disease in LAD and its branches, CTO of the mid LCX and OM2 (with R to L and L to L collaterals), moderate disease in the mid RCA. Recommendations at that time were for continued medical therapy and to perform PCI to the CTO of mid LCX if he had further chest pain. At last office visit on 03/09/15 he was denying any CP, SOB, palpitations, or edema.   In the ER this admission, CXR was consistent with pulmonary edema. He was significantly  hypertensive with systolic BP ~ 323. He was treated with IV NTG, 80mg  IV lasix with improvement in respiratory status. Labs notable for BNP 1110, sr cr 1.50 (up from baseline of 1), troponin 0.02, glucose 288, WBC 15.3, and hgb 16.3. ABG 7.323 / 41/ 153 /21.7.It was ultimately felt that he had acute on chronic combined CHF. Further diuresis was pursued with improvement in respiratory status and mental status. He had Troponin elevation (0.02 -->1.50-->1.45-->1.39) felt likely due to acute respiratory failure, HTN emergency, AKI. Medical therapy for his CAD was continued - it is felt that outpatient stress testing can be considered by primary cardiologist. He was treated with cetriaxone for a possible UTI for 3 days - will receive last dose today. By day of discharge he has diuresed 219lb->210lb, -5.4L. 2D echo was repeated which Dr. Haroldine Laws personally reviewed and felt EF was now 25%, likely due to hypertensive crisis. Official read is pending in EPIC. Ramipril was continued. Lopressor was changed to Coreg. Low dose spironolactone was added at 12.5mg  daily. Potassium was stopped since this was started as a precaution, given that renal function remains somewhat abnormal. Amlodipine was initially started this admission but stopped given the med changes mentioned. Dr. Haroldine Laws recommended to consider outpatient sleep study. I have sent a message to our [facility] office's scheduler requesting a TOC follow-up appointment this week along with a BMET, and our office will call the patient with this information.  Cr at discharge is 1.48. This will need to be followed closely. Because his creatinine was right on the border of 1.5-1.6 and he is 81  years old, his apixaban dose was decreased to 2.5mg  BID per discussion with Dr. Haroldine Laws. Dr. Haroldine Laws also recommended to stop aspirin since he is on apixaban. If his creatinine improves on follow-up BMET later this week, will need to consider increasing apixaban back to  5mg  BID.   Discharge Vitals: Blood pressure 155/74, pulse 79, temperature 97.9 F (36.6 C), temperature source Oral, resp. rate 20, height 5\' 11"  (1.803 m), weight 210 lb 8.6 oz (95.5 kg), SpO2 93 %.  Labs: Lab Results  Component Value Date   WBC 8.8 04/24/2015   HGB 13.0 04/24/2015   HCT 39.7 04/24/2015   MCV 91.3 04/24/2015   PLT 157 04/24/2015    Recent Labs Lab 04/23/15 1115  04/25/15 0252  NA 135  < > 137  K 4.0  < > 3.8  CL 101  < > 98*  CO2 19*  < > 28  BUN 17  < > 29*  CREATININE 1.64*  < > 1.48*  CALCIUM 9.1  < > 9.0  PROT 7.2  --   --   BILITOT 1.5*  --   --   ALKPHOS 60  --   --   ALT 15*  --   --   AST 26  --   --   GLUCOSE 276*  < > 131*  < > = values in this interval not displayed.  Recent Labs  04/23/15 1855 04/23/15 2150 04/24/15 0353  TROPONINI 1.50* 1.45* 1.39*   Lab Results  Component Value Date   CHOL 173 11/23/2014   HDL 48 11/23/2014   LDLCALC 89 11/23/2014   TRIG 182* 11/23/2014    Diagnostic Studies/Procedures   2D echo full report pending.  Dg Chest Port 1 View  04/24/2015   CLINICAL DATA:  Pulmonary edema, history of CHF, coronary artery disease, and diabetes  EXAM: PORTABLE CHEST - 1 VIEW  COMPARISON:  Portable chest x-ray of Apr 23, 2015  FINDINGS: The lungs are adequately inflated. The interstitial markings remain increased but have improved. The retrocardiac region on the left remains dense but is less conspicuous. The left hemidiaphragm remains largely obscured. The cardiac silhouette is enlarged. The central pulmonary vascularity is engorged.  IMPRESSION: CHF with mild interval improvement in the appearance of the pulmonary interstitium.   Electronically Signed   By: David  Martinique M.D.   On: 04/24/2015 07:22   Dg Chest Portable 1 View  04/23/2015   CLINICAL DATA:  Hypertension, diabetes, respiratory distress  EXAM: PORTABLE CHEST - 1 VIEW  COMPARISON:  11/23/2014  FINDINGS: Cardiomegaly again noted. Central mild vascular  congestion. Mild perihilar and infrahilar interstitial prominence suspicious for interstitial edema. No segmental infiltrate  IMPRESSION: Central mild vascular congestion. Mild perihilar and infrahilar interstitial prominence suspicious for interstitial edema. No segmental infiltrate .   Electronically Signed   By: Lahoma Crocker M.D.   On: 04/23/2015 12:34    Discharge Medications   Current Discharge Medication List    START taking these medications   Details  carvedilol (COREG) 6.25 MG tablet Take 1 tablet (6.25 mg total) by mouth 2 (two) times daily with a meal. Qty: 60 tablet, Refills: 3    spironolactone (ALDACTONE) 25 MG tablet Take 0.5 tablets (12.5 mg total) by mouth daily. Qty: 30 tablet, Refills: 3      CONTINUE these medications which have CHANGED   Details  apixaban (ELIQUIS) 2.5 MG TABS tablet Take 1 tablet (2.5 mg total) by mouth 2 (two) times daily. Qty: 60 tablet,  Refills: 3    furosemide (LASIX) 40 MG tablet Take 1 tablet (40 mg total) by mouth 2 (two) times daily. Take 1 extra tablet daily if weight is up 3 pounds or more. Qty: 60 tablet, Refills: 3      CONTINUE these medications which have NOT CHANGED   Details  acetaminophen (TYLENOL) 500 MG tablet Take 500 mg by mouth every 6 (six) hours as needed for moderate pain.    atorvastatin (LIPITOR) 40 MG tablet Take 1 tablet (40 mg total) by mouth daily at 6 PM.     Bisacodyl (LAXATIVE PO) Take 1 tablet by mouth as needed (for constipation).    metFORMIN (GLUCOPHAGE) 1000 MG tablet Take 1 tablet (1,000 mg total) by mouth 2 (two) times daily with a meal.     nitroGLYCERIN (NITROSTAT) 0.4 MG SL tablet Place 1 tablet (0.4 mg total) under the tongue every 5 (five) minutes x 3 doses as needed for chest pain.     ramipril (ALTACE) 10 MG capsule Take 10 mg by mouth daily.    tamsulosin (FLOMAX) 0.4 MG CAPS capsule Take 0.4 mg by mouth daily as needed (BPH).      STOP taking these medications     aspirin EC 81 MG  tablet      metoprolol tartrate (LOPRESSOR) 25 MG tablet      potassium chloride SA (K-DUR,KLOR-CON) 20 MEQ tablet         Disposition   The patient will be discharged in stable condition to home. Discharge Instructions    Diet - low sodium heart healthy    Complete by:  As directed      Increase activity slowly    Complete by:  As directed   No driving for 1 week. No lifting over 10 lbs for 2 weeks. No sexual activity for 2 weeks. Keep procedure site clean & dry. If you notice increased pain, swelling, bleeding or pus, call/return!  You may shower, but no soaking baths/hot tubs/pools for 1 week.  Please pay attention to several changes to your medicines. - New medicines include carvedilol & spironolactone. - Your furosemide (Lasix) dose has increased. - Your apixaban (Eliquis) dose has decreased. This is a new tablet size as well. We had to reduce the dose because of your kidney function. If your kidney function improves, we may end up going back to your old dose but we will let you know when we recheck your labwork later this week. - Stop taking aspirin, metoprolol, and potassium.          Follow-up Information    Follow up with Dorothy Spark, MD.   Specialty:  Cardiology   Why:  Office will call you for your followup appointment. Call office if you have not heard back in 3 days. You will also have labwork that day.   Contact information:   [address] [place] [address] [phone]         Duration of Discharge Encounter: Greater than 30 minutes including physician and PA time.  Raechel Ache PA-C 04/25/2015, 12:41 PM   Patient seen and examined with Melina Copa, PA-C. We discussed all aspects of the encounter. I agree with the assessment and plan as stated above.  He is improved. [facility] for d/c today.  Willson Lipa,MD 11:44 PM

## 2015-04-25 NOTE — Progress Notes (Signed)
Patient Name: [name] [name] Date of Encounter: 04/25/2015  Principal Problem:   Acute respiratory failure Active Problems:   Diabetes mellitus type 2 in obese   Paroxysmal atrial fibrillation   HLD (hyperlipidemia)   CAD (coronary artery disease)   Acute on chronic systolic and diastolic heart failure, NYHA class 4   Hypertensive urgency   Acute systolic heart failure   Length of Stay: 2  SUBJECTIVE No complaints today. Will like to go home today. HAs ambulated in room to bathroom. Says breathing has so much improved. Also says leg swelling has improved. HAs not required BIPAP since he was in the ED 2 days ago. He is now on room air, and saturating ~100%.  CURRENT MEDS . amLODipine  2.5 mg Oral Daily  . antiseptic oral rinse  7 mL Mouth Rinse BID  . apixaban  5 mg Oral BID  . aspirin EC  81 mg Oral Daily  . atorvastatin  40 mg Oral q1800  . cefTRIAXone (ROCEPHIN)  IV  1 g Intravenous Q24H  . furosemide  40 mg Oral BID  . insulin aspart  0-15 Units Subcutaneous TID WC  . metoprolol tartrate  25 mg Oral BID  . potassium chloride SA  20 mEq Oral Daily  . ramipril  10 mg Oral Daily  . sodium chloride  3 mL Intravenous Q12H     OBJECTIVE  Filed Vitals:   04/24/15 2227 04/25/15 0000 04/25/15 0319 04/25/15 0400  BP: 141/87 149/77 144/71   Pulse: 76 66 64 66  Temp:  98.5 F (36.9 C) 98.2 F (36.8 C)   TempSrc:  Oral Oral   Resp:  12 13 16   Height:      Weight:   210 lb 8.6 oz (95.5 kg)   SpO2:  98% 94% 95%    Intake/Output Summary (Last 24 hours) at 04/25/15 0718 Last data filed at 04/25/15 0500  Gross per 24 hour  Intake    893 ml  Output   2025 ml  Net  -1132 ml   Filed Weights   04/23/15 1530 04/24/15 0400 04/25/15 0319  Weight: 219 lb 9.3 oz (99.6 kg) 209 lb 11.2 oz (95.119 kg) 210 lb 8.6 oz (95.5 kg)   PHYSICAL EXAM  General: Pleasant, NAD. Neuro: Alert and oriented X 3. Moves all extremities spontaneously. Psych: Normal and appropriate mood and  affect. HEENT:  At, [place]  Neck: Supple without bruits or JVD. Lungs:  Resp regular and unlabored, CTA, on room air, minimal basilar crackles,  Heart: RRR no s3, s4, or murmurs. Abdomen: Soft, non-tender, non-distended, BS + x 4.  Extremities: No clubbing, cyanosis or edema. DP/PT/Radials 2+ and equal bilaterally.  CBC  Recent Labs  04/23/15 1115 04/23/15 1128 04/24/15 0353  WBC 15.3*  --  8.8  NEUTROABS 10.6*  --   --   HGB 14.2 16.3 13.0  HCT 44.7 48.0 39.7  MCV 94.1  --  91.3  PLT 196  --  939   Basic Metabolic Panel  Recent Labs  04/24/15 0353 04/25/15 0252  NA 136 137  K 4.0 3.8  CL 100* 98*  CO2 26 28  GLUCOSE 126* 131*  BUN 25* 29*  CREATININE 1.59* 1.48*  CALCIUM 9.0 9.0   Liver Function Tests  Recent Labs  04/23/15 1115  AST 26  ALT 15*  ALKPHOS 60  BILITOT 1.5*  PROT 7.2  ALBUMIN 3.5   Cardiac Enzymes  Recent Labs  04/23/15 1855 04/23/15 2150 04/24/15 0353  TROPONINI 1.50* 1.45* 1.39*   Radiology/Studies  Dg Chest Port 1 View  04/24/2015   CLINICAL DATA:  Pulmonary edema, history of CHF, coronary artery disease, and diabetes  EXAM: PORTABLE CHEST - 1 VIEW  COMPARISON:  Portable chest x-ray of Apr 23, 2015  FINDINGS: The lungs are adequately inflated. The interstitial markings remain increased but have improved. The retrocardiac region on the left remains dense but is less conspicuous. The left hemidiaphragm remains largely obscured. The cardiac silhouette is enlarged. The central pulmonary vascularity is engorged.  IMPRESSION: CHF with mild interval improvement in the appearance of the pulmonary interstitium.   Electronically Signed   By: David  Martinique M.D.   On: 04/24/2015 07:22   Dg Chest Portable 1 View  04/23/2015   CLINICAL DATA:  Hypertension, diabetes, respiratory distress  EXAM: PORTABLE CHEST - 1 VIEW  COMPARISON:  11/23/2014  FINDINGS: Cardiomegaly again noted. Central mild vascular congestion. Mild perihilar and infrahilar interstitial  prominence suspicious for interstitial edema. No segmental infiltrate  IMPRESSION: Central mild vascular congestion. Mild perihilar and infrahilar interstitial prominence suspicious for interstitial edema. No segmental infiltrate .   Electronically Signed   By: Lahoma Crocker M.D.   On: 04/23/2015 12:34   TELE: SR, rate- mostly 60s.  ASSESSMENT AND PLAN  1. Acute on chronic respiratory failure/acute on chronic systolic/diastolic heart failure-presumed pulmonary edema on chest x-ray. Sec to HTN emergency. Net out 5.2L since admission. Now on room air. Weight today- 210, on admission- 219.  - Ambulate - Continue 40mg  daily PO lasix on discharge - Likely can go home today - Echo pending, will call to make sure it is done - Prior Ejection fraction only mildly reduced at 40-45% previously. I will check an echocardiogram to make sure that this has not markedly changed.  2. Paroxysmal atrial fibrillation-maintaining sinus rhythm currently. HR- 60s- 70s. - Continue with anticoagulation, Eliquis. Continue with metoprolol. - But switch metop to XL,   3. Hypertensive emergency-when first arrived in ER, blood pressure in the 419 systolic range. I would add amlodipine 2.5 mg PO daily. - Cont home ramipril- 10mg  daily - Cont Norvasc- 2.5mg  daily - Cont lasix at 40mg  daily  4. Hyperlipidemia-continue with atorvastatin  5. Coronary artery disease-CTO of obtuse marginal-unsuccessful wire across in December 2015. Moderate LAD disease. Troponin 1.5 -> 1.39, chest pain free, this is most probably sec to HTN emergency and acute on CKD.  - Can consider outpt stress test if ECHo is unchanged a s he has no chest pain. - Cont aspirin  Signed, Denton Brick, [place]  [facility]- resident, PGY-2  04/25/2015, 8:18 AM   Patient seen and examined with Dr. Denton Brick. We discussed all aspects of the encounter. I agree with the assessment and plan as stated above.   He is much improved. Volume status looks good. I reviewed echo  images personally and EF now 25%. Likely due to HTN crisis. Can go home today.  Continue ramipril 10. Lasix 40 bid. Take extra 40 lasix if weight up 3 or more pounds Stop amlodipine. Switch lopressor to carvedilol 6.25 bid Add spiro 12.5 F/u this week with BMET Consider outpatient sleep study.  Bensimhon, Daniel,MD 11:16 AM

## 2015-04-25 NOTE — Plan of Care (Signed)
Problem: Phase I Progression Outcomes Goal: Voiding-avoid urinary catheter unless indicated Outcome: Not Met (add Reason) Pt required foley for aggressive diuresis

## 2015-04-27 LAB — URINE CULTURE: Colony Count: >=100000

## 2015-04-27 LAB — HEMOGLOBIN A1C
Hgb A1c MFr Bld: 6.3 % — ABNORMAL HIGH (ref 4.8–5.6)
Mean Plasma Glucose: 134 mg/dL

## 2015-04-28 ENCOUNTER — Encounter: Payer: Self-pay | Admitting: *Deleted

## 2015-04-28 ENCOUNTER — Telehealth: Payer: Self-pay | Admitting: Cardiology

## 2015-04-28 NOTE — Telephone Encounter (Signed)
Pts wife calling to ask for samples of Eliquis 2.5 mg po bid to be left up front for pick-up at pts [facility] tomorrow 5/25 with Richardson Dopp PA-C.  Informed the wife that if samples are available, I will leave them at the front desk for pick-up at [facility] tomorrow.  Pts wife verbalized understanding and gracious for all the assistance provided.  Contacted the pt and wife back to inform them both that there are no samples available of Eliquis 2.5 mg at our office.  Both verbalized understanding.

## 2015-04-28 NOTE — Telephone Encounter (Signed)
New Message    Patients wife needs to speak to nurse please give a call back.

## 2015-04-29 ENCOUNTER — Encounter: Payer: Self-pay | Admitting: Physician Assistant

## 2015-04-29 ENCOUNTER — Other Ambulatory Visit (INDEPENDENT_AMBULATORY_CARE_PROVIDER_SITE_OTHER): Payer: Medicare HMO

## 2015-04-29 ENCOUNTER — Ambulatory Visit (INDEPENDENT_AMBULATORY_CARE_PROVIDER_SITE_OTHER): Payer: Medicare HMO | Admitting: Physician Assistant

## 2015-04-29 VITALS — BP 130/60 | HR 66 | Ht 71.0 in | Wt 211.0 lb

## 2015-04-29 DIAGNOSIS — I480 Paroxysmal atrial fibrillation: Secondary | ICD-10-CM

## 2015-04-29 DIAGNOSIS — R0683 Snoring: Secondary | ICD-10-CM

## 2015-04-29 DIAGNOSIS — I5043 Acute on chronic combined systolic (congestive) and diastolic (congestive) heart failure: Secondary | ICD-10-CM | POA: Diagnosis not present

## 2015-04-29 DIAGNOSIS — E785 Hyperlipidemia, unspecified: Secondary | ICD-10-CM

## 2015-04-29 DIAGNOSIS — I5042 Chronic combined systolic (congestive) and diastolic (congestive) heart failure: Secondary | ICD-10-CM | POA: Diagnosis not present

## 2015-04-29 DIAGNOSIS — I2510 Atherosclerotic heart disease of native coronary artery without angina pectoris: Secondary | ICD-10-CM

## 2015-04-29 DIAGNOSIS — N189 Chronic kidney disease, unspecified: Secondary | ICD-10-CM | POA: Diagnosis not present

## 2015-04-29 DIAGNOSIS — R7989 Other specified abnormal findings of blood chemistry: Secondary | ICD-10-CM

## 2015-04-29 DIAGNOSIS — R778 Other specified abnormalities of plasma proteins: Secondary | ICD-10-CM

## 2015-04-29 DIAGNOSIS — I10 Essential (primary) hypertension: Secondary | ICD-10-CM

## 2015-04-29 LAB — BASIC METABOLIC PANEL
BUN: 47 mg/dL — ABNORMAL HIGH (ref 6–23)
CALCIUM: 9.9 mg/dL (ref 8.4–10.5)
CO2: 29 mEq/L (ref 19–32)
Chloride: 98 mEq/L (ref 96–112)
Creatinine, Ser: 1.81 mg/dL — ABNORMAL HIGH (ref 0.40–1.50)
GFR: 38.28 mL/min — ABNORMAL LOW (ref >60.00)
GLUCOSE: 121 mg/dL — AB (ref 70–99)
POTASSIUM: 4.6 meq/L (ref 3.5–5.1)
Sodium: 135 mEq/L (ref 135–145)

## 2015-04-29 LAB — BRAIN NATRIURETIC PEPTIDE: Pro B Natriuretic peptide (BNP): 113 pg/mL — ABNORMAL HIGH (ref 0.0–100.0)

## 2015-04-29 MED ORDER — ATORVASTATIN CALCIUM 40 MG PO TABS: 40.0000 mg | ORAL_TABLET | Freq: Every day | ORAL | Status: DC

## 2015-04-29 NOTE — Patient Instructions (Addendum)
Medication Instructions:  Your physician recommends that you continue on your current medications as directed. Please refer to the Current Medication list given to you today.   Labwork: TODAY BMET, BNP  Testing/Procedures: Your physician has recommended that you have a SPLIT NIGHT sleep study. This test records several body functions during sleep, including: brain activity, eye movement, oxygen and carbon dioxide blood levels, heart rate and rhythm, breathing rate and rhythm, the flow of air through your mouth and nose, snoring, body muscle movements, and chest and belly movement.   Follow-Up: 05/22/15 @ 12:10 WITH SCOTT WEAVER, PAC SAME DAY DR. Meda Coffee IS IN THE OFFICE  Any Other Special Instructions Will Be Listed Below (If Applicable).  Low-Sodium Eating Plan Sodium raises blood pressure and causes water to be held in the body. Getting less sodium from food will help lower your blood pressure, reduce any swelling, and protect your heart, liver, and kidneys. We get sodium by adding salt (sodium chloride) to food. Most of our sodium comes from canned, boxed, and frozen foods. Restaurant foods, fast foods, and pizza are also very high in sodium. Even if you take medicine to lower your blood pressure or to reduce fluid in your body, getting less sodium from your food is important. WHAT IS MY PLAN? Most people should limit their sodium intake to 2,300 mg a day. Your health care provider recommends that you limit your sodium intake to __________ a day.  WHAT DO I NEED TO KNOW ABOUT THIS EATING PLAN? For the low-sodium eating plan, you will follow these general guidelines:  Choose foods with a % Daily Value for sodium of less than 5% (as listed on the food label).   Use salt-free seasonings or herbs instead of table salt or sea salt.   Check with your health care provider or pharmacist before using salt substitutes.   Eat fresh foods.  Eat more vegetables and fruits.  Limit canned  vegetables. If you do use them, rinse them well to decrease the sodium.   Limit cheese to 1 oz (28 g) per day.   Eat lower-sodium products, often labeled as "lower sodium" or "no salt added."  Avoid foods that contain monosodium glutamate (MSG). MSG is sometimes added to [place] food and some canned foods.  Check food labels (Nutrition Facts labels) on foods to learn how much sodium is in one serving.  Eat more home-cooked food and less restaurant, buffet, and fast food.  When eating at a restaurant, ask that your food be prepared with less salt or none, if possible.  HOW DO I READ FOOD LABELS FOR SODIUM INFORMATION? The Nutrition Facts label lists the amount of sodium in one serving of the food. If you eat more than one serving, you must multiply the listed amount of sodium by the number of servings. Food labels may also identify foods as:  Sodium free--Less than 5 mg in a serving.  Very low sodium--35 mg or less in a serving.  Low sodium--140 mg or less in a serving.  Light in sodium--50% less sodium in a serving. For example, if a food that usually has 300 mg of sodium is changed to become light in sodium, it will have 150 mg of sodium.  Reduced sodium--25% less sodium in a serving. For example, if a food that usually has 400 mg of sodium is changed to reduced sodium, it will have 300 mg of sodium. WHAT FOODS CAN I EAT? Grains Low-sodium cereals, including oats, puffed wheat and rice, and  shredded wheat cereals. Low-sodium crackers. Unsalted rice and pasta. Lower-sodium bread.  Vegetables Frozen or fresh vegetables. Low-sodium or reduced-sodium canned vegetables. Low-sodium or reduced-sodium tomato sauce and paste. Low-sodium or reduced-sodium tomato and vegetable juices.  Fruits Fresh, frozen, and canned fruit. Fruit juice.  Meat and Other Protein Products Low-sodium canned tuna and salmon. Fresh or frozen meat, poultry, seafood, and fish. Lamb. Unsalted nuts. Dried  beans, peas, and lentils without added salt. Unsalted canned beans. Homemade soups without salt. Eggs.  Dairy Milk. Soy milk. Ricotta cheese. Low-sodium or reduced-sodium cheeses. Yogurt.  Condiments Fresh and dried herbs and spices. Salt-free seasonings. Onion and garlic powders. Low-sodium varieties of mustard and ketchup. Lemon juice.  Fats and Oils Reduced-sodium salad dressings. Unsalted butter.  Other Unsalted popcorn and pretzels.  The items listed above may not be a complete list of recommended foods or beverages. Contact your dietitian for more options. WHAT FOODS ARE NOT RECOMMENDED? Grains Instant hot cereals. Bread stuffing, pancake, and biscuit mixes. Croutons. Seasoned rice or pasta mixes. Noodle soup cups. Boxed or frozen macaroni and cheese. Self-rising flour. Regular salted crackers. Vegetables Regular canned vegetables. Regular canned tomato sauce and paste. Regular tomato and vegetable juices. Frozen vegetables in sauces. Salted french fries. Olives. Angie Fava. Relishes. Sauerkraut. Salsa. Meat and Other Protein Products Salted, canned, smoked, spiced, or pickled meats, seafood, or fish. Bacon, ham, sausage, hot dogs, corned beef, chipped beef, and packaged luncheon meats. Salt pork. Jerky. Pickled herring. Anchovies, regular canned tuna, and sardines. Salted nuts. Dairy Processed cheese and cheese spreads. Cheese curds. Blue cheese and cottage cheese. Buttermilk.  Condiments Onion and garlic salt, seasoned salt, table salt, and sea salt. Canned and packaged gravies. Worcestershire sauce. Tartar sauce. Barbecue sauce. Teriyaki sauce. Soy sauce, including reduced sodium. Steak sauce. Fish sauce. Oyster sauce. Cocktail sauce. Horseradish. Regular ketchup and mustard. Meat flavorings and tenderizers. Bouillon cubes. Hot sauce. Tabasco sauce. Marinades. Taco seasonings. Relishes. Fats and Oils Regular salad dressings. Salted butter. Margarine. Ghee. Bacon fat.   Other Potato and tortilla chips. Corn chips and puffs. Salted popcorn and pretzels. Canned or dried soups. Pizza. Frozen entrees and pot pies.  The items listed above may not be a complete list of foods and beverages to avoid. Contact your dietitian for more information. Document Released: 05/13/2002 Document Revised: 11/26/2013 Document Reviewed: 09/25/2013 [facility] Patient Information 2015 [place], [place]. This information is not intended to replace advice given to you by your health care provider. Make sure you discuss any questions you have with your health care provider.

## 2015-04-29 NOTE — Progress Notes (Signed)
Cardiology Office Note   Date:  04/29/2015   ID:  [name] [name], DOB 07-07-1930, MRN [id number]  PCP:  Stephens Shire, MD  Cardiologist:  Dr. Ena Dawley     Chief Complaint  Patient presents with  . Hospitalization Follow-up    CHF  . Coronary Artery Disease  . Congestive Heart Failure     History of Present Illness: [name] [name] is a 81 y.o. male with a hx of diabetes, HTN, BPH. He was admitted in December 2683 with systolic/diastolic HF in the setting of AF with RVR. Cardiac enzymes were elevated consistent with a non-STEMI. Cardiac catheterization demonstrated moderate disease in the LAD, chronic total occlusion of the mid left circumflex and OM 2 branch with right to left and left to left collaterals and moderate disease in the RCA. Medical therapy was recommended. CTO PCI of the LCx could be considered if the patient had symptoms of refractory angina. He converted to NSR on diltiazem drip. EF is 40 at 45% by echocardiogram. Last seen by Dr. Meda Coffee 03/09/15.    Admitted 5/19-5/21 with acute on chronic respiratory failure in the setting of acute on chronic combined systolic and diastolic CHF and hypertensive emergency.  His systolic blood pressure was over 200 upon presentation. He developed acute kidney injury on top of chronic kidney disease with IV diuresis. He had troponin elevation felt to be related to respiratory failure, hypertensive emergency and acute kidney injury. It was felt that outpatient stress testing could be considered. Repeat echo demonstrated worsening LV function with EF 30%. This was reviewed by cardiology in the hospital and felt to be likely depressed from hypertensive crisis. CHF medications were adjusted. It was also recommended to consider an outpatient sleep test to rule out sleep apnea. Of note, his Eliquis was reduced to 2.5 mg twice a day due to worsening renal function and age greater than 71.  He returns for follow-up.  Breathing better.  Probably  NYHA 2b.  No orthopnea, PND.  No edema. Weight fairly stable. Has gained 1 pound a day x 3.  No cough or wheezing.  No syncope. Gets lightheaded sometimes.    Studies/Reports Reviewed Today:  Echo 04/25/15 - mildconcentric hypertrophy. EF 30%. Severeglobal hypokinesis. Grade 1 diastolicdysfunction).  - Ventricular septum: Septal motion showed abnormal function anddyssynergy. These changes are consistent with intraventricularconduction delay. - Aortic valve: Mildly to moderately calcified annulus. Trileaflet.There was trivial regurgitation. - Aorta: Mild aortic root dilatation. Aortic root dimension: 42 mm(ED). - Ascending aorta: Ascending aorta 3.8 cm in maximal diameter. - Mitral valve: Calcified annulus. There was mild regurgitation. - Left atrium: The atrium was severely dilated. Volume/bsa, S: 46.53ml/m^2. - Right atrium: The atrium was mildly dilated. - Tricuspid valve: There was mild regurgitation. - Pulmonic valve: There was trivial regurgitation. Impressions:- When compared to the report dated 03/05/15, LVEF has significantlydeclined.   Echo 03/05/15 - EF 40% to 45%. There is hypokinesis of the inferiormyocardium. Grade 2 diastolic dysfunction). - Aortic valve: There was mild regurgitation. - Mitral valve: Calcified annulus. There was moderateregurgitation. - Left atrium: The atrium was moderately dilated. - Right ventricle: The cavity size was mildly dilated. Wallthickness was normal. - PA peak pressure: 41 mm Hg (S). Impressions: Compared to the prior study, there has been no significantinterval change.  LHC 11/24/14 LM: Patent LAD: Patent LCx: Occluded with right to left collaterals RCA: Mid moderate diffuse disease PCI: Unsuccessful attempt at PCI of the LCx-chronic total occlusion  Past Medical History  Diagnosis Date  .  Hypertension   . HLD (hyperlipidemia)   . NSTEMI (non-ST elevated myocardial infarction)   . Type II diabetes mellitus   . Paroxysmal  atrial fibrillation     a. diagnosed on 11/2014 admission. Spontaneously converted to NSR. Placed on Eliquis  . Chronic combined systolic and diastolic CHF (congestive heart failure)     a. 2d ECHO 11/24/14 with EF 40-45%, akinesis of the inferior, inferolateral and inferoseptal walls; overall mild to moderate reduction in LV function; grade 2 diastolic dysfunction; severe LAE; trace AI; moderate MR; mild TR with severely elevated pulmonary pressures. b. EF 25% in 04/2015, possibly due to HTN crisis.  Marland Kitchen CAD (coronary artery disease)     a. s/p LHC on 11/24/14 w/ mild-mod dz in LAD. CTO of mLCx and OM2 with R-->L and L-->L collaterals. b. Elevated trop 04/2015 felt due to demand ischemia.  . Pulmonary edema   . Renal insufficiency     a. Elev Cr 04/2015 requiring reduction in Eliquis.  . Obesity     Past Surgical History  Procedure Laterality Date  . Appendectomy    . Total knee arthroplasty Left 2013  . Tonsillectomy    . Joint replacement    . Cardiac catheterization  11/24/2014  . Left heart catheterization with coronary angiogram N/A 11/24/2014    Procedure: LEFT HEART CATHETERIZATION WITH CORONARY ANGIOGRAM;  Surgeon: Jettie Booze, MD;  Location: [facility] CATH LAB;  Service: Cardiovascular;  Laterality: N/A;     Current Outpatient Prescriptions  Medication Sig Dispense Refill  . acetaminophen (TYLENOL) 500 MG tablet Take 500 mg by mouth every 6 (six) hours as needed for moderate pain.    Marland Kitchen apixaban (ELIQUIS) 2.5 MG TABS tablet Take 1 tablet (2.5 mg total) by mouth 2 (two) times daily. 60 tablet 3  . atorvastatin (LIPITOR) 40 MG tablet Take 1 tablet (40 mg total) by mouth daily at 6 PM. 90 tablet 3  . Bisacodyl (LAXATIVE PO) Take 1 tablet by mouth as needed (for constipation).    . carvedilol (COREG) 6.25 MG tablet Take 1 tablet (6.25 mg total) by mouth 2 (two) times daily with a meal. 60 tablet 3  . furosemide (LASIX) 40 MG tablet Take 1 tablet (40 mg total) by mouth 2 (two) times  daily. Take 1 extra tablet daily if weight is up 3 pounds or more. 60 tablet 3  . metFORMIN (GLUCOPHAGE) 1000 MG tablet Take 1 tablet (1,000 mg total) by mouth 2 (two) times daily with a meal. 60 tablet 11  . nitroGLYCERIN (NITROSTAT) 0.4 MG SL tablet Place 1 tablet (0.4 mg total) under the tongue every 5 (five) minutes x 3 doses as needed for chest pain. 25 tablet 12  . ramipril (ALTACE) 10 MG capsule Take 10 mg by mouth daily.    Marland Kitchen spironolactone (ALDACTONE) 25 MG tablet Take 0.5 tablets (12.5 mg total) by mouth daily. 30 tablet 3  . tamsulosin (FLOMAX) 0.4 MG CAPS capsule Take 0.4 mg by mouth daily as needed (BPH).     No current facility-administered medications for this visit.    Allergies:   Review of patient's allergies indicates no known allergies.    Social History:  The patient  reports that he has never smoked. He has never used smokeless tobacco. He reports that he drinks about 8.4 oz of alcohol per week. He reports that he does not use illicit drugs.   Family History:  The patient's family history includes Cancer (age of onset: 2) in his father; Heart  attack in his brother; Stroke in his mother.    ROS:   Please see the history of present illness.   Review of Systems  Constitution: Positive for weight gain.  Cardiovascular: Positive for dyspnea on exertion.  Respiratory: Positive for cough.   Gastrointestinal: Positive for constipation.  Psychiatric/Behavioral: Positive for depression.  All other systems reviewed and are negative.     PHYSICAL EXAM: VS:  BP 130/60 mmHg  Pulse 66  Ht 5\' 11"  (1.803 m)  Wt 211 lb (95.709 kg)  BMI 29.44 kg/m2    Wt Readings from Last 3 Encounters:  04/29/15 211 lb (95.709 kg)  04/25/15 210 lb 8.6 oz (95.5 kg)  03/09/15 222 lb (100.699 kg)     GEN: Well nourished, well developed, in no acute distress HEENT: normal Neck: no JVD,  no masses Cardiac:  Normal S1/S2, RRR; no murmur ,  no rubs or gallops, trace bilateral LE edema    Respiratory:  clear to auscultation bilaterally, no wheezing, rhonchi or rales. GI: soft, nontender, nondistended, + BS MS: no deformity or atrophy Skin: warm and dry  Neuro:  CNs II-XII intact, Strength and sensation are intact Psych: Normal affect   EKG:  EKG is ordered today.  It demonstrates:   Sinus rhythm, HR 66, LAD, interventricular conduction delay, T-wave inversions in 2, 3, aVF, V6, no change from prior tracing   Recent Labs: 11/23/2014: Pro B Natriuretic peptide (BNP) 2215.0*; TSH 5.210* 04/23/2015: ALT 15*; B Natriuretic Peptide 1076.3* 04/24/2015: Hemoglobin 13.0; Platelets 157 04/25/2015: BUN 29*; Creatinine 1.48*; Potassium 3.8; Sodium 137    Lipid Panel    Component Value Date/Time   CHOL 173 11/23/2014 0903   TRIG 182* 11/23/2014 0903   HDL 48 11/23/2014 0903   CHOLHDL 3.6 11/23/2014 0903   VLDL 36 11/23/2014 0903   LDLCALC 89 11/23/2014 0903      ASSESSMENT AND PLAN:  Chronic combined systolic and diastolic heart failure:  Volume appears stable. Weights have been fairly stable since discharge. He does have instructions to take an Lasix if his weight is up 3 pounds. He is overall NYHA 2b. Worsening LV function was thought to be related to hypertensive emergency. He did have elevated troponins. There was a mention of whether or not he should have stress testing as an outpatient. He is not having anginal symptoms. I do not think he needs stress testing at this point. We will likely need to consider repeat echocardiogram in a approximately 90 days. If his ejection fraction does not improve, consider cardiac catheterization versus referral to EP for ICD implantation. Check a BMET, BNP today. If his renal function and potassium are stable, consider increasing spironolactone to 25 mg daily.  Coronary artery disease involving native coronary artery of native heart without angina pectoris:  No angina. He does not take aspirin as he is on Eliquis. Continue statin, beta  blocker, ACE inhibitor. Circumflex is known to be chronically occluded. If he has refractory angina, PCI can be considered of his LCx.  Paroxysmal atrial fibrillation:  Maintaining NSR. Continue Eliquis 2.5 mg twice a day. Check BMET today. If creatinine less than 1.5, consider changing Eliquis back to 5 mg twice a day.  CKD (chronic kidney disease), unspecified stage:  Check BMET today.  Hyperlipidemia:  Continue statin.  Essential hypertension:  Better controlled.  Elevated troponin:  Elevated troponins were likely related to chronic total occlusion of the circumflex in the setting of hypertensive emergency and acute pulmonary edema. I do not think  he needs a stress test at this point. We concern to consider this in the future if he has symptoms suggestive of angina.  Snoring:  Arrange sleep testing.   Current medicines are reviewed at length with the patient today.  Concerns regarding medicines are as outlined above.  The following changes have been made:    No change    Labs/ tests ordered today include:  Orders Placed This Encounter  Procedures  . Basic Metabolic Panel (BMET)  . B Nat Peptide  . EKG 12-Lead  . Split night study    Disposition:   FU with Dr. Ena Dawley or me 3 weeks.    Signed, Versie Starks, MHS 04/29/2015 [facility] [place], [place], [place]  [address] Phone: [phone]; Fax: [phone]

## 2015-05-01 ENCOUNTER — Telehealth: Payer: Self-pay | Admitting: Cardiology

## 2015-05-01 DIAGNOSIS — R899 Unspecified abnormal finding in specimens from other organs, systems and tissues: Secondary | ICD-10-CM

## 2015-05-01 MED ORDER — FUROSEMIDE 40 MG PO TABS: ORAL_TABLET | ORAL | Status: DC

## 2015-05-01 NOTE — Telephone Encounter (Signed)
°  Follow Up ° ° °Returning call regarding test results. Please call. °

## 2015-05-01 NOTE — Telephone Encounter (Signed)
BNP ok    Creatinine up some    Decrease Lasix to 40 mg in A and 20 mg in P    Continue current dose of Eliquis    BMET 1 week    Weigh daily and call if: Weight up 3 lbs in one day, increased swelling or increased dyspnea.     Richardson Dopp, PA-C     04/29/2015 11:11 PM    Called patient's spouse Vaughan Basta (DPR). Per [employer], result notes of labs given as written above. BMET ordered and scheduled for 05/07/15. Patient's spouse verbalized understanding.

## 2015-05-06 ENCOUNTER — Other Ambulatory Visit (INDEPENDENT_AMBULATORY_CARE_PROVIDER_SITE_OTHER): Payer: Medicare HMO | Admitting: *Deleted

## 2015-05-06 DIAGNOSIS — R899 Unspecified abnormal finding in specimens from other organs, systems and tissues: Secondary | ICD-10-CM

## 2015-05-06 LAB — BASIC METABOLIC PANEL
BUN: 57 mg/dL — AB (ref 6–23)
CO2: 25 mEq/L (ref 19–32)
Calcium: 9.7 mg/dL (ref 8.4–10.5)
Chloride: 102 mEq/L (ref 96–112)
Creatinine, Ser: 2.3 mg/dL — ABNORMAL HIGH (ref 0.40–1.50)
GFR: 29.03 mL/min — ABNORMAL LOW (ref >60.00)
Glucose, Bld: 124 mg/dL — ABNORMAL HIGH (ref 70–99)
POTASSIUM: 4.8 meq/L (ref 3.5–5.1)
Sodium: 135 mEq/L (ref 135–145)

## 2015-05-07 ENCOUNTER — Other Ambulatory Visit: Payer: Medicare HMO

## 2015-05-07 ENCOUNTER — Telehealth: Payer: Self-pay | Admitting: *Deleted

## 2015-05-07 DIAGNOSIS — I5022 Chronic systolic (congestive) heart failure: Secondary | ICD-10-CM

## 2015-05-07 MED ORDER — FUROSEMIDE 40 MG PO TABS: ORAL_TABLET | ORAL | Status: DC

## 2015-05-07 NOTE — Telephone Encounter (Signed)
pt notified of lab results and med changes. BMET 05/11/15. Hold lasix and spironolactone friday 6/3, restart 6/4 sprionolactone 12.5 mg daily, decrease lasix to 40 mg daily. Pt verbalized understanding to all instructions.

## 2015-05-11 ENCOUNTER — Other Ambulatory Visit (INDEPENDENT_AMBULATORY_CARE_PROVIDER_SITE_OTHER): Payer: Medicare HMO | Admitting: *Deleted

## 2015-05-11 DIAGNOSIS — I5022 Chronic systolic (congestive) heart failure: Secondary | ICD-10-CM | POA: Diagnosis not present

## 2015-05-11 LAB — BASIC METABOLIC PANEL
BUN: 30 mg/dL — ABNORMAL HIGH (ref 6–23)
CALCIUM: 9.7 mg/dL (ref 8.4–10.5)
CHLORIDE: 101 meq/L (ref 96–112)
CO2: 28 meq/L (ref 19–32)
Creatinine, Ser: 1.38 mg/dL (ref 0.40–1.50)
GFR: 52.34 mL/min — ABNORMAL LOW (ref >60.00)
Glucose, Bld: 178 mg/dL — ABNORMAL HIGH (ref 70–99)
Potassium: 4.2 mEq/L (ref 3.5–5.1)
SODIUM: 137 meq/L (ref 135–145)

## 2015-05-12 ENCOUNTER — Telehealth: Payer: Self-pay | Admitting: Cardiology

## 2015-05-12 NOTE — Telephone Encounter (Signed)
Patient notified of lab results - "Potassium ok; Creatinine improved back to normal; Continue with current treatment plan", per Richardson Dopp, PA.  Patient verbalized understanding and appreciation for phone call. Denies further questions or concerns.

## 2015-05-12 NOTE — Telephone Encounter (Signed)
New message  ° ° °Patient calling for test results.   °

## 2015-05-14 ENCOUNTER — Telehealth: Payer: Self-pay | Admitting: Cardiology

## 2015-05-14 NOTE — Telephone Encounter (Signed)
error 

## 2015-05-18 DIAGNOSIS — H3530 Unspecified macular degeneration: Secondary | ICD-10-CM | POA: Insufficient documentation

## 2015-05-18 DIAGNOSIS — E119 Type 2 diabetes mellitus without complications: Secondary | ICD-10-CM | POA: Insufficient documentation

## 2015-05-18 DIAGNOSIS — H2510 Age-related nuclear cataract, unspecified eye: Secondary | ICD-10-CM | POA: Insufficient documentation

## 2015-05-22 ENCOUNTER — Ambulatory Visit: Payer: Medicare HMO | Admitting: Physician Assistant

## 2015-06-10 NOTE — Progress Notes (Signed)
Cardiology Office Note   Date:  06/11/2015   ID:  [name], DOB [birth year], MRN [id number]  PCP:  Stephens Shire, MD  Cardiologist:  Dr. Ena Dawley     Chief Complaint  Patient presents with  . Congestive Heart Failure  . Coronary Artery Disease  . Atrial Fibrillation     History of Present Illness: [name] [name] is a 81 y.o. male with a hx of diabetes, HTN, BPH. He was admitted in December 0354 with systolic/diastolic HF in the setting of AF with RVR. Cardiac enzymes were elevated consistent with a non-STEMI. Cardiac catheterization demonstrated moderate disease in the LAD, chronic total occlusion of the mid left circumflex and OM2 branch with right to left and left to left collaterals and moderate disease in the RCA. Medical therapy was recommended. CTO PCI of the LCx could be considered if the patient had symptoms of refractory angina. He converted to NSR on diltiazem drip. EF is 40 at 45% by echocardiogram.    Admitted 04/2015 with acute on chronic respiratory failure in the setting of acute on chronic combined systolic and diastolic CHF and hypertensive emergency.  His systolic blood pressure was over 200 upon presentation. He developed acute kidney injury on top of chronic kidney disease with IV diuresis. He had troponin elevation felt to be related to respiratory failure, hypertensive emergency and acute kidney injury. It was felt that outpatient stress testing could be considered. Repeat echo demonstrated worsening LV function with EF 30%. This was reviewed by cardiology in the hospital and felt to be likely depressed from hypertensive crisis. CHF medications were adjusted. It was also recommended to consider an outpatient sleep test to rule out sleep apnea. Of note, his Eliquis was reduced to 2.5 mg twice a day due to worsening renal function and age greater than 85.  Seen in FU 04/29/15.  I did not think that he needed to undergo stress testing at that time.  My plan is to  repeat an Echo in August to assess for recovery of LVF.  If EF does not recover, consider further testing.    He returns for FU.  Here alone.  Doing well since last seen.  Denies chest pain or significant dyspnea.  He is NYHA 2b.  He goes bowling often.  Denies any chest pain or dyspnea with this. He denies syncope, orthopnea, PND, edema.  He was taking Bisacodyl every day. He has been having diarrhea.  He plans to stop taking this QD.    Studies/Reports Reviewed Today:  Echo 04/25/15 - Mild LVH. EF 30%. Severeglobal HK. Grade 1 DD.  - Ventricular Septal motion withdyssynergy consistent with intraventricularconduction delay. - Trivial AI - Mild aortic root dilatation.   42 mm(ED). - Ascending aorta 3.8 cm in maximal diameter. - MAC, mild MR - severe LAE. Volume/bsa, S: 46.24ml/m^2. - mild RAE - Mild TR, Trivial PI. Impressions:- When compared to the report dated 03/05/15, LVEF has significantlydeclined.  Echo 03/05/15 - EF 40% to 45%. There is hypokinesis of the inferiormyocardium. Grade 2 diastolic dysfunction). - Aortic valve: There was mild regurgitation. - Mitral valve: Calcified annulus. There was moderateregurgitation. - Left atrium: The atrium was moderately dilated. - Right ventricle: The cavity size was mildly dilated. Wallthickness was normal. - PA peak pressure: 41 mm Hg (S). Impressions: Compared to the prior study, there has been no significantinterval change.  LHC 11/24/14 LM: Patent LAD: Patent LCx: Occluded with right to left collaterals RCA: Mid moderate diffuse disease PCI: Unsuccessful attempt  at PCI of the LCx-chronic total occlusion   Past Medical History  Diagnosis Date  . Hypertension   . HLD (hyperlipidemia)   . NSTEMI (non-ST elevated myocardial infarction)   . Type II diabetes mellitus   . Paroxysmal atrial fibrillation     a. diagnosed on 11/2014 admission. Spontaneously converted to NSR. Placed on Eliquis  . Chronic combined systolic and  diastolic CHF (congestive heart failure)     a. 2d ECHO 11/24/14 with EF 40-45%, akinesis of the inferior, inferolateral and inferoseptal walls; overall mild to moderate reduction in LV function; grade 2 diastolic dysfunction; severe LAE; trace AI; moderate MR; mild TR with severely elevated pulmonary pressures. b. EF 25% in 04/2015, possibly due to HTN crisis.  Marland Kitchen CAD (coronary artery disease)     a. s/p LHC on 11/24/14 w/ mild-mod dz in LAD. CTO of mLCx and OM2 with R-->L and L-->L collaterals. b. Elevated trop 04/2015 felt due to demand ischemia.  . Pulmonary edema   . Renal insufficiency     a. Elev Cr 04/2015 requiring reduction in Eliquis.  . Obesity     Past Surgical History  Procedure Laterality Date  . Appendectomy    . Total knee arthroplasty Left 2013  . Tonsillectomy    . Joint replacement    . Cardiac catheterization  11/24/2014  . Left heart catheterization with coronary angiogram N/A 11/24/2014    Procedure: LEFT HEART CATHETERIZATION WITH CORONARY ANGIOGRAM;  Surgeon: Jettie Booze, MD;  Location: [facility] CATH LAB;  Service: Cardiovascular;  Laterality: N/A;     Current Outpatient Prescriptions  Medication Sig Dispense Refill  . acetaminophen (TYLENOL) 500 MG tablet Take 500 mg by mouth every 6 (six) hours as needed for moderate pain.    Marland Kitchen apixaban (ELIQUIS) 2.5 MG TABS tablet Take 1 tablet (2.5 mg total) by mouth 2 (two) times daily. 60 tablet 3  . atorvastatin (LIPITOR) 40 MG tablet Take 1 tablet (40 mg total) by mouth daily at 6 PM. 90 tablet 3  . Bisacodyl (LAXATIVE PO) Take 1 tablet by mouth as needed (for constipation).    . carvedilol (COREG) 6.25 MG tablet Take 1 tablet (6.25 mg total) by mouth 2 (two) times daily with a meal. 60 tablet 3  . furosemide (LASIX) 40 MG tablet Take 1 tablet 40 mg in AM .Take 1 extra tablet daily if weight is up 3 pounds or more.    . metFORMIN (GLUCOPHAGE) 1000 MG tablet Take 1 tablet (1,000 mg total) by mouth 2 (two) times daily with a  meal. 60 tablet 11  . nitroGLYCERIN (NITROSTAT) 0.4 MG SL tablet Place 1 tablet (0.4 mg total) under the tongue every 5 (five) minutes x 3 doses as needed for chest pain. 25 tablet 12  . ramipril (ALTACE) 10 MG capsule Take 10 mg by mouth daily.    Marland Kitchen spironolactone (ALDACTONE) 25 MG tablet Take 0.5 tablets (12.5 mg total) by mouth daily. 30 tablet 3  . tamsulosin (FLOMAX) 0.4 MG CAPS capsule Take 0.4 mg by mouth daily as needed (BPH).     No current facility-administered medications for this visit.    Allergies:   Review of patient's allergies indicates no known allergies.    Social History:  The patient  reports that he has never smoked. He has never used smokeless tobacco. He reports that he drinks about 8.4 oz of alcohol per week. He reports that he does not use illicit drugs.   Family History:  The patient's family  history includes Cancer (age of onset: 35) in his father; Heart attack in his brother; Stroke in his mother. There is no history of Hypertension.    ROS:   Please see the history of present illness.   Review of Systems  Cardiovascular: Positive for dyspnea on exertion.  Hematologic/Lymphatic: Bruises/bleeds easily.  All other systems reviewed and are negative.     PHYSICAL EXAM: VS:  BP 110/60 mmHg  Pulse 73  Ht 5\' 11"  (1.803 m)  Wt 211 lb (95.709 kg)  BMI 29.44 kg/m2    Wt Readings from Last 3 Encounters:  06/11/15 211 lb (95.709 kg)  04/29/15 211 lb (95.709 kg)  04/25/15 210 lb 8.6 oz (95.5 kg)     GEN: Well nourished, well developed, in no acute distress HEENT: normal Neck: no JVD,  no masses Cardiac:  Normal S1/S2, RRR; no murmur ,  no rubs or gallops, trace bilateral LE edema  Respiratory:  clear to auscultation bilaterally, no wheezing, rhonchi or rales. GI: soft, nontender, nondistended, + BS MS: no deformity or atrophy Skin: warm and dry  Neuro:  CNs II-XII intact, Strength and sensation are intact Psych: Normal affect   EKG:  EKG is ordered  today.  It demonstrates:   NSR, HR 73, LAD, inf-lat TWI, PAC, no changes   Recent Labs: 11/23/2014: TSH 5.210* 04/23/2015: ALT 15*; B Natriuretic Peptide 1076.3* 04/24/2015: Hemoglobin 13.0; Platelets 157 04/29/2015: Pro B Natriuretic peptide (BNP) 113.0* 05/11/2015: BUN 30*; Creatinine, Ser 1.38; Potassium 4.2; Sodium 137    Lipid Panel    Component Value Date/Time   CHOL 173 11/23/2014 0903   TRIG 182* 11/23/2014 0903   HDL 48 11/23/2014 0903   CHOLHDL 3.6 11/23/2014 0903   VLDL 36 11/23/2014 0903   LDLCALC 89 11/23/2014 0903      ASSESSMENT AND PLAN:  Chronic combined systolic and diastolic heart failure:   Doing well.  He is overall NYHA 2b. Worsening LV function during recent hospitalization was thought to be related to hypertensive emergency. He did have elevated troponins. Stress testing was considered. I do not think he needs stress testing. He denies any symptoms of angina.  He will need a repeat echocardiogram in August. If his ejection fraction does not improve, consider cardiac catheterization versus referral to EP for ICD implantation. Check FU BMET today.   Coronary artery disease involving native coronary artery of native heart without angina pectoris:  No angina. He does not take aspirin as he is on Eliquis. Continue statin, beta blocker, ACE inhibitor. Circumflex is known to be chronically occluded. If he has refractory angina, PCI can be considered of his LCx.     Paroxysmal atrial fibrillation:  Maintaining NSR. Continue Eliquis 2.5 mg twice a day. Check BMET again today. If creatinine remains less than 1.5, consider changing Eliquis back to 5 mg twice a day. Check CBC as well.   CKD (chronic kidney disease), unspecified stage:  Check BMET today.  Hyperlipidemia:  Continue statin.  Essential hypertension:  Better controlled.  Elevated troponin:  Elevated troponins were likely related to chronic total occlusion of the circumflex in the setting of hypertensive  emergency and acute pulmonary edema. I do not think he needs a stress test at this point. We will consider this in the future if he has symptoms suggestive of angina or he has no improvement in his EF (vs proceeding to [facility]).    Snoring:  He declines arranging a sleep study.     Medication Adjustments: Current medicines  are reviewed at length with the patient today.  Concerns regarding medicines are as outlined above.  The following changes have been made:   Modified Medications   No medications on file   Discontinued Medications   No medications on file   New Prescriptions   No medications on file    Labs/ tests ordered today include:  Orders Placed This Encounter  Procedures  . Basic Metabolic Panel (BMET)  . CBC w/Diff  . EKG 12-Lead  . Echocardiogram      Disposition:   FU Dr. Ena Dawley after his Echo (8-10) weeks.    Signed, Versie Starks, MHS 06/11/2015 1:27 PM    [facility] [place], [place], [place]  [address] Phone: [phone]; Fax: [phone]

## 2015-06-11 ENCOUNTER — Ambulatory Visit (INDEPENDENT_AMBULATORY_CARE_PROVIDER_SITE_OTHER): Payer: Medicare HMO | Admitting: Physician Assistant

## 2015-06-11 ENCOUNTER — Encounter: Payer: Self-pay | Admitting: Physician Assistant

## 2015-06-11 VITALS — BP 110/60 | HR 73 | Ht 71.0 in | Wt 211.0 lb

## 2015-06-11 DIAGNOSIS — N189 Chronic kidney disease, unspecified: Secondary | ICD-10-CM | POA: Diagnosis not present

## 2015-06-11 DIAGNOSIS — E785 Hyperlipidemia, unspecified: Secondary | ICD-10-CM

## 2015-06-11 DIAGNOSIS — I480 Paroxysmal atrial fibrillation: Secondary | ICD-10-CM

## 2015-06-11 DIAGNOSIS — I2510 Atherosclerotic heart disease of native coronary artery without angina pectoris: Secondary | ICD-10-CM

## 2015-06-11 DIAGNOSIS — R0683 Snoring: Secondary | ICD-10-CM

## 2015-06-11 DIAGNOSIS — I5042 Chronic combined systolic (congestive) and diastolic (congestive) heart failure: Secondary | ICD-10-CM

## 2015-06-11 DIAGNOSIS — I10 Essential (primary) hypertension: Secondary | ICD-10-CM

## 2015-06-11 DIAGNOSIS — Z7901 Long term (current) use of anticoagulants: Secondary | ICD-10-CM | POA: Diagnosis not present

## 2015-06-11 LAB — BASIC METABOLIC PANEL
BUN: 25 mg/dL — AB (ref 6–23)
CALCIUM: 9.6 mg/dL (ref 8.4–10.5)
CHLORIDE: 102 meq/L (ref 96–112)
CO2: 29 mEq/L (ref 19–32)
CREATININE: 1.5 mg/dL (ref 0.40–1.50)
GFR: 47.53 mL/min — ABNORMAL LOW (ref >60.00)
Glucose, Bld: 111 mg/dL — ABNORMAL HIGH (ref 70–99)
Potassium: 4.2 mEq/L (ref 3.5–5.1)
Sodium: 140 mEq/L (ref 135–145)

## 2015-06-11 LAB — CBC WITH DIFFERENTIAL/PLATELET
Basophils Absolute: 0 10*3/uL (ref 0.0–0.1)
Basophils Relative: 0.5 % (ref 0.0–3.0)
EOS PCT: 1.6 % (ref 0.0–5.0)
Eosinophils Absolute: 0.1 10*3/uL (ref 0.0–0.7)
HCT: 37.5 % — ABNORMAL LOW (ref 39.0–52.0)
Hemoglobin: 12.4 g/dL — ABNORMAL LOW (ref 13.0–17.0)
LYMPHS PCT: 27.3 % (ref 12.0–46.0)
Lymphs Abs: 1.9 10*3/uL (ref 0.7–4.0)
MCHC: 33.2 g/dL (ref 30.0–36.0)
MCV: 89 fl (ref 78.0–100.0)
MONOS PCT: 7.6 % (ref 3.0–12.0)
Monocytes Absolute: 0.5 10*3/uL (ref 0.1–1.0)
NEUTROS PCT: 63 % (ref 43.0–77.0)
Neutro Abs: 4.5 10*3/uL (ref 1.4–7.7)
PLATELETS: 186 10*3/uL (ref 150.0–400.0)
RBC: 4.21 Mil/uL — ABNORMAL LOW (ref 4.22–5.81)
RDW: 15 % (ref 11.5–15.5)
WBC: 7.1 10*3/uL (ref 4.0–10.5)

## 2015-06-11 NOTE — Patient Instructions (Signed)
Medication Instructions:  Take Bisacodyl as needed for constipation.  Do not take it every day..//  Labwork: Today:  BMET, CBC  Testing/Procedures: Schedule a limited Echocardiogram AFTER 07/26/2015.  Your physician has requested that you have an echocardiogram. Echocardiography is a painless test that uses sound waves to create images of your heart. It provides your doctor with information about the size and shape of your heart and how well your heart's chambers and valves are working. This procedure takes approximately one hour. There are no restrictions for this procedure.    Follow-Up: Dr. Ena Dawley 8-10 weeks (schedule appointment AFTER the Echocardiogram)  Any Other Special Instructions Will Be Listed Below (If Applicable).

## 2015-06-12 ENCOUNTER — Telehealth: Payer: Self-pay | Admitting: *Deleted

## 2015-06-12 DIAGNOSIS — I4891 Unspecified atrial fibrillation: Secondary | ICD-10-CM

## 2015-06-12 MED ORDER — APIXABAN 5 MG PO TABS: 5.0000 mg | ORAL_TABLET | Freq: Two times a day (BID) | ORAL | Status: DC

## 2015-06-12 NOTE — Telephone Encounter (Signed)
Pt notified of lab results and to increase Eliquis back to 5 mg BID. Bmet, cbc 7/29, I will look for samples for pt.

## 2015-06-16 ENCOUNTER — Telehealth: Payer: Self-pay | Admitting: Cardiology

## 2015-06-16 NOTE — Telephone Encounter (Signed)
New message      Pt is having problem with diarrhea.  Pt is needing to know which medication could be causing this problem.   Pt has bisacodyl listed on discharge notes from office and pt does not take this medication.   Please call to advise If unable to reach pt on mobile, please call home phone.

## 2015-06-16 NOTE — Telephone Encounter (Signed)
Pt and wife calling to report that over the course of 3 weeks the pt has been experiencing very loose stools.  Per the pts wife he has had watery diarrhea for the past 3 weeks. Pt and wife both state that the pt does have bouts of constipation at times, but over the last 3 weeks he has had nothing but diarrhea.  Wife and pt both want to know if his meds are causing this issue.  Wife states that the pt has taken no laxatives at all.   Wife reports the pt does eat a pretty high fiber diet.  Wife reports the pt does not take a daily probiotic, but she has been giving him Activia on a daily basis.  Wife reports the pt eats lots of fruit on a daily basis. Wife reports there is no blood noted in the pts stool.  Wife states that the pts diarrhea is so bad at times, he has to wear a depends.  Pt complains of no nausea or vomiting associated with his diarrhea. Wife and pt report that he has not been on any antibiotics within the past couple of months. Wife states that the pt has no other complaints, just diarrhea.  Wife states that the pts abdomen does not appear to be distended.  Wife and pt would like for Dr Meda Coffee to advise on the situation.  Informed the wife and pt that Dr Meda Coffee is out of the office the rest of the week.  Reported to both the wife and pt that given his complaints of diarrhea over an extended amount of time, this would need to be further investigated by the patients PCP.  Informed both the wife and pt that his PCP would need to assess him for any infections or possible bowel obstruction, and a stool sample could be ordered for collection.  Advised the wife and pt that he should contact his PCP today or tomorrow to schedule an appt for this week for his complaint of diarrhea.  Informed both parties that issues mentioned above should be ruled out before we assume his medications are causing this issue.  Advised the pt that he should continue with drinking plenty of fluids, even  gatorade to prevent electrolyte imbalance.  Advised the pt to obtain a daily probiotic by eating plain yogurt, no activia. Advised the pt to avoid fat, greasy foods, and any roughage. Advised the pt to eat a lower fiber diet while he's still experiencing diarrhea. Informed both the wife and pt that I will route this message to Dr Meda Coffee for further review and recommendation.  Advised the pt and wife to follow-up with our office with his PCP's diagnosis and recommendations.  Both verbalized understanding, and agrees with this plan stating "we will contact our PCP to schedule an appt for this week."

## 2015-06-22 ENCOUNTER — Other Ambulatory Visit (INDEPENDENT_AMBULATORY_CARE_PROVIDER_SITE_OTHER): Payer: Medicare HMO | Admitting: *Deleted

## 2015-06-22 DIAGNOSIS — I4891 Unspecified atrial fibrillation: Secondary | ICD-10-CM

## 2015-06-22 LAB — BASIC METABOLIC PANEL
BUN: 34 mg/dL — AB (ref 6–23)
CO2: 27 mEq/L (ref 19–32)
Calcium: 9.4 mg/dL (ref 8.4–10.5)
Chloride: 105 mEq/L (ref 96–112)
Creatinine, Ser: 1.48 mg/dL (ref 0.40–1.50)
GFR: 48.27 mL/min — ABNORMAL LOW (ref >60.00)
Glucose, Bld: 115 mg/dL — ABNORMAL HIGH (ref 70–99)
Potassium: 4.5 mEq/L (ref 3.5–5.1)
Sodium: 141 mEq/L (ref 135–145)

## 2015-06-22 LAB — CBC WITH DIFFERENTIAL/PLATELET
BASOS ABS: 0 10*3/uL (ref 0.0–0.1)
BASOS PCT: 0.4 % (ref 0.0–3.0)
EOS PCT: 1.7 % (ref 0.0–5.0)
Eosinophils Absolute: 0.1 10*3/uL (ref 0.0–0.7)
HCT: 34.3 % — ABNORMAL LOW (ref 39.0–52.0)
Hemoglobin: 11.4 g/dL — ABNORMAL LOW (ref 13.0–17.0)
LYMPHS ABS: 1.6 10*3/uL (ref 0.7–4.0)
LYMPHS PCT: 21.2 % (ref 12.0–46.0)
MCHC: 33.3 g/dL (ref 30.0–36.0)
MCV: 89.6 fl (ref 78.0–100.0)
MONO ABS: 0.6 10*3/uL (ref 0.1–1.0)
MONOS PCT: 7.8 % (ref 3.0–12.0)
NEUTROS ABS: 5.4 10*3/uL (ref 1.4–7.7)
Neutrophils Relative %: 68.9 % (ref 43.0–77.0)
Platelets: 183 10*3/uL (ref 150.0–400.0)
RBC: 3.83 Mil/uL — AB (ref 4.22–5.81)
RDW: 15.4 % (ref 11.5–15.5)
WBC: 7.8 10*3/uL (ref 4.0–10.5)

## 2015-06-23 ENCOUNTER — Other Ambulatory Visit: Payer: Self-pay | Admitting: *Deleted

## 2015-06-23 DIAGNOSIS — R710 Precipitous drop in hematocrit: Secondary | ICD-10-CM

## 2015-06-23 DIAGNOSIS — N189 Chronic kidney disease, unspecified: Secondary | ICD-10-CM

## 2015-07-06 ENCOUNTER — Other Ambulatory Visit (INDEPENDENT_AMBULATORY_CARE_PROVIDER_SITE_OTHER): Payer: Medicare HMO | Admitting: *Deleted

## 2015-07-06 DIAGNOSIS — R710 Precipitous drop in hematocrit: Secondary | ICD-10-CM

## 2015-07-06 DIAGNOSIS — N189 Chronic kidney disease, unspecified: Secondary | ICD-10-CM | POA: Diagnosis not present

## 2015-07-06 DIAGNOSIS — D649 Anemia, unspecified: Secondary | ICD-10-CM | POA: Diagnosis not present

## 2015-07-06 LAB — IRON AND TIBC
%SAT: 17 % — ABNORMAL LOW (ref 20–55)
Iron: 35 ug/dL — ABNORMAL LOW (ref 42–165)
TIBC: 208 ug/dL — ABNORMAL LOW (ref 215–435)
UIBC: 173 ug/dL (ref 125–400)

## 2015-07-06 LAB — CBC WITH DIFFERENTIAL/PLATELET
BASOS PCT: 0.5 % (ref 0.0–3.0)
Basophils Absolute: 0 10*3/uL (ref 0.0–0.1)
Eosinophils Absolute: 0.1 10*3/uL (ref 0.0–0.7)
Eosinophils Relative: 1.6 % (ref 0.0–5.0)
HCT: 31.6 % — ABNORMAL LOW (ref 39.0–52.0)
HEMOGLOBIN: 10.5 g/dL — AB (ref 13.0–17.0)
Lymphocytes Relative: 13.1 % (ref 12.0–46.0)
Lymphs Abs: 1.1 10*3/uL (ref 0.7–4.0)
MCHC: 33.4 g/dL (ref 30.0–36.0)
MCV: 89 fl (ref 78.0–100.0)
Monocytes Absolute: 0.7 10*3/uL (ref 0.1–1.0)
Monocytes Relative: 8.2 % (ref 3.0–12.0)
Neutro Abs: 6.3 10*3/uL (ref 1.4–7.7)
Neutrophils Relative %: 76.6 % (ref 43.0–77.0)
Platelets: 245 10*3/uL (ref 150.0–400.0)
RBC: 3.55 Mil/uL — AB (ref 4.22–5.81)
RDW: 15.1 % (ref 11.5–15.5)
WBC: 8.2 10*3/uL (ref 4.0–10.5)

## 2015-07-06 LAB — FERRITIN: Ferritin: >1500 ng/mL — ABNORMAL HIGH (ref 22.0–322.0)

## 2015-07-06 LAB — IRON: Iron: 34 ug/dL — ABNORMAL LOW (ref 42–165)

## 2015-07-07 ENCOUNTER — Telehealth: Payer: Self-pay | Admitting: *Deleted

## 2015-07-07 ENCOUNTER — Other Ambulatory Visit: Payer: Medicare HMO

## 2015-07-07 NOTE — Telephone Encounter (Signed)
Pt's wife notified of lab results by phone today. I advised per Brynda Rim. PA if pt has not had a stroke then to [facility] due to Hgb steadily dropping 7/7 12.4; 7/18 11.4; 8/1 10.5. Advised pt needs to see PCP this week per Brynda Rim. PA. I confirmed Dr. Tollie Pizza still PCP; wife said yes and that pt is going to for labs (bmet) today and they will make appt for later this week w/PCP. I will fax lab results to Dr. Tollie Pizza today. Wife verbalized understanding to results given by phone and plan of care.

## 2015-07-12 ENCOUNTER — Encounter (HOSPITAL_BASED_OUTPATIENT_CLINIC_OR_DEPARTMENT_OTHER): Payer: Medicare HMO

## 2015-07-15 ENCOUNTER — Other Ambulatory Visit: Payer: Self-pay | Admitting: Gastroenterology

## 2015-07-15 ENCOUNTER — Encounter (HOSPITAL_COMMUNITY): Payer: Self-pay | Admitting: *Deleted

## 2015-07-15 NOTE — Progress Notes (Signed)
Spoke with wife says no mention of pulmonary hypertension for medical history for her husband that she can remember

## 2015-07-16 ENCOUNTER — Ambulatory Visit (HOSPITAL_COMMUNITY): Payer: Medicare HMO | Admitting: Anesthesiology

## 2015-07-16 ENCOUNTER — Encounter (HOSPITAL_COMMUNITY)
Admission: RE | Disposition: A | Discharge status: Home / Self Care | Payer: Self-pay | Source: Ambulatory Visit | Attending: Gastroenterology

## 2015-07-16 ENCOUNTER — Ambulatory Visit (HOSPITAL_COMMUNITY)
Admission: RE | Admit: 2015-07-16 | Discharge: 2015-07-16 | Disposition: A | Discharge status: Home / Self Care | Payer: Medicare HMO | Source: Ambulatory Visit | Attending: Gastroenterology | Admitting: Gastroenterology

## 2015-07-16 ENCOUNTER — Encounter (HOSPITAL_COMMUNITY): Payer: Self-pay | Admitting: Certified Registered Nurse Anesthetist

## 2015-07-16 DIAGNOSIS — Z96652 Presence of left artificial knee joint: Secondary | ICD-10-CM | POA: Diagnosis not present

## 2015-07-16 DIAGNOSIS — K5730 Diverticulosis of large intestine without perforation or abscess without bleeding: Secondary | ICD-10-CM | POA: Insufficient documentation

## 2015-07-16 DIAGNOSIS — R197 Diarrhea, unspecified: Secondary | ICD-10-CM | POA: Insufficient documentation

## 2015-07-16 DIAGNOSIS — E118 Type 2 diabetes mellitus with unspecified complications: Secondary | ICD-10-CM | POA: Diagnosis not present

## 2015-07-16 DIAGNOSIS — I10 Essential (primary) hypertension: Secondary | ICD-10-CM | POA: Diagnosis not present

## 2015-07-16 DIAGNOSIS — L539 Erythematous condition, unspecified: Secondary | ICD-10-CM | POA: Insufficient documentation

## 2015-07-16 DIAGNOSIS — I4891 Unspecified atrial fibrillation: Secondary | ICD-10-CM | POA: Diagnosis not present

## 2015-07-16 DIAGNOSIS — D509 Iron deficiency anemia, unspecified: Secondary | ICD-10-CM | POA: Diagnosis present

## 2015-07-16 DIAGNOSIS — Z800 Family history of malignant neoplasm of digestive organs: Secondary | ICD-10-CM | POA: Insufficient documentation

## 2015-07-16 DIAGNOSIS — Z79899 Other long term (current) drug therapy: Secondary | ICD-10-CM | POA: Insufficient documentation

## 2015-07-16 DIAGNOSIS — I5040 Unspecified combined systolic (congestive) and diastolic (congestive) heart failure: Secondary | ICD-10-CM | POA: Insufficient documentation

## 2015-07-16 DIAGNOSIS — I252 Old myocardial infarction: Secondary | ICD-10-CM | POA: Diagnosis not present

## 2015-07-16 DIAGNOSIS — E785 Hyperlipidemia, unspecified: Secondary | ICD-10-CM | POA: Diagnosis not present

## 2015-07-16 DIAGNOSIS — Z7901 Long term (current) use of anticoagulants: Secondary | ICD-10-CM | POA: Insufficient documentation

## 2015-07-16 HISTORY — PX: COLONOSCOPY WITH PROPOFOL: SHX5780

## 2015-07-16 HISTORY — PX: ESOPHAGOGASTRODUODENOSCOPY (EGD) WITH PROPOFOL: SHX5813

## 2015-07-16 LAB — GLUCOSE, CAPILLARY: GLUCOSE-CAPILLARY: 105 mg/dL — AB (ref 65–99)

## 2015-07-16 SURGERY — ESOPHAGOGASTRODUODENOSCOPY (EGD) WITH PROPOFOL
Anesthesia: Monitor Anesthesia Care

## 2015-07-16 MED ORDER — LIDOCAINE HCL (CARDIAC) 20 MG/ML IV SOLN
INTRAVENOUS | Status: AC
  Filled 2015-07-16: qty 5

## 2015-07-16 MED ORDER — SODIUM CHLORIDE 0.9 % IV SOLN: INTRAVENOUS | Status: DC

## 2015-07-16 MED ORDER — PROPOFOL 10 MG/ML IV BOLUS
INTRAVENOUS | Status: AC
  Filled 2015-07-16: qty 20

## 2015-07-16 MED ORDER — PROPOFOL INFUSION 10 MG/ML OPTIME
INTRAVENOUS | Status: DC | PRN
  Administered 2015-07-16: 120 ug/kg/min via INTRAVENOUS

## 2015-07-16 MED ORDER — ONDANSETRON HCL 4 MG/2ML IJ SOLN
INTRAMUSCULAR | Status: DC | PRN
  Administered 2015-07-16: 4 mg via INTRAVENOUS

## 2015-07-16 MED ORDER — LACTATED RINGERS IV SOLN
INTRAVENOUS | Status: DC
  Administered 2015-07-16: 1000 mL via INTRAVENOUS

## 2015-07-16 MED ORDER — LACTATED RINGERS IV SOLN: INTRAVENOUS | Status: DC

## 2015-07-16 MED ORDER — LIDOCAINE HCL (CARDIAC) 20 MG/ML IV SOLN
INTRAVENOUS | Status: DC | PRN
  Administered 2015-07-16: 100 mg via INTRAVENOUS

## 2015-07-16 MED ORDER — ONDANSETRON HCL 4 MG/2ML IJ SOLN
INTRAMUSCULAR | Status: AC
  Filled 2015-07-16: qty 2

## 2015-07-16 MED ORDER — FENTANYL CITRATE (PF) 100 MCG/2ML IJ SOLN: 25.0000 ug | INTRAMUSCULAR | Status: DC | PRN

## 2015-07-16 MED ORDER — PROPOFOL 10 MG/ML IV BOLUS
INTRAVENOUS | Status: DC | PRN
  Administered 2015-07-16: 10 mg via INTRAVENOUS
  Administered 2015-07-16: 20 mg via INTRAVENOUS

## 2015-07-16 SURGICAL SUPPLY — 24 items

## 2015-07-16 NOTE — Interval H&P Note (Signed)
History and Physical Interval Note:  07/16/2015 12:50 PM  [name] [name]  has presented today for surgery, with the diagnosis of IDA  The various methods of treatment have been discussed with the patient and family. After consideration of risks, benefits and other options for treatment, the patient has consented to  Procedure(s): ESOPHAGOGASTRODUODENOSCOPY (EGD) WITH PROPOFOL (N/A) COLONOSCOPY WITH PROPOFOL (N/A) as a surgical intervention .  The patient's history has been reviewed, patient examined, no change in status, stable for surgery.  I have reviewed the patient's chart and labs.  Questions were answered to the patient's satisfaction.     Dawana Asper D

## 2015-07-16 NOTE — Anesthesia Postprocedure Evaluation (Signed)
  Anesthesia Post-op Note  Patient: [name] [name]  Procedure(s) Performed: Procedure(s) (LRB): ESOPHAGOGASTRODUODENOSCOPY (EGD) WITH PROPOFOL (N/A) COLONOSCOPY WITH PROPOFOL (N/A)  Patient Location: PACU  Anesthesia Type: MAC  Level of Consciousness: awake and alert   Airway and Oxygen Therapy: Patient Spontanous Breathing  Post-op Pain: mild  Post-op Assessment: Post-op Vital signs reviewed, Patient's Cardiovascular Status Stable, Respiratory Function Stable, Patent Airway and No signs of Nausea or vomiting  Last Vitals:  Filed Vitals:   07/16/15 1450  BP: 156/83  Pulse: 68  Temp:   Resp: 15    Post-op Vital Signs: stable   Complications: No apparent anesthesia complications

## 2015-07-16 NOTE — Anesthesia Preprocedure Evaluation (Addendum)
Anesthesia Evaluation  Patient identified by MRN, date of birth, ID band Patient awake    Reviewed: Allergy & Precautions, H&P , NPO status , Patient's Chart, lab work & pertinent test results, reviewed documented beta blocker date and time   Airway Mallampati: II  TM Distance: >3 FB Neck ROM: full    Dental  (+) Dental Advisory Given, Edentulous Upper, Edentulous Lower   Pulmonary neg pulmonary ROS,  breath sounds clear to auscultation  Pulmonary exam normal       Cardiovascular hypertension, Pt. on medications and Pt. on home beta blockers + CAD, + Past MI and +CHF Normal cardiovascular exam+ dysrhythmias Atrial Fibrillation Rhythm:regular Rate:Normal  Combined systolic and diastolic CHF. Pulmonary htn.  History EF 25% 5/16 possibly due to hypertensive crises. Prolonged QT.   Neuro/Psych negative neurological ROS  negative psych ROS   GI/Hepatic negative GI ROS, Neg liver ROS,   Endo/Other  diabetes, Well Controlled, Type 2, Oral Hypoglycemic Agents  Renal/GU Renal diseasenegative Renal ROS  negative genitourinary   Musculoskeletal   Abdominal   Peds  Hematology negative hematology ROS (+) anemia , hgb 10.5   Anesthesia Other Findings   Reproductive/Obstetrics negative OB ROS                            Anesthesia Physical Anesthesia Plan  ASA: IV  Anesthesia Plan: MAC   Post-op Pain Management:    Induction:   Airway Management Planned:   Additional Equipment:   Intra-op Plan:   Post-operative Plan:   Informed Consent: I have reviewed the patients History and Physical, chart, labs and discussed the procedure including the risks, benefits and alternatives for the proposed anesthesia with the patient or authorized representative who has indicated his/her understanding and acceptance.   Dental Advisory Given  Plan Discussed with: CRNA and Surgeon  Anesthesia Plan Comments:          Anesthesia Quick Evaluation

## 2015-07-16 NOTE — Transfer of Care (Signed)
Immediate Anesthesia Transfer of Care Note  Patient: [name] [name]  Procedure(s) Performed: Procedure(s): ESOPHAGOGASTRODUODENOSCOPY (EGD) WITH PROPOFOL (N/A) COLONOSCOPY WITH PROPOFOL (N/A)  Patient Location: ENDO  Anesthesia Type:MAC  Level of Consciousness:  sedated, patient cooperative and responds to stimulation  Airway & Oxygen Therapy:Patient Spontanous Breathing and Patient connected to face mask oxgen  Post-op Assessment:  Report given to ENDO RN and Post -op Vital signs reviewed and stable  Post vital signs:  Reviewed and stable  Last Vitals:  Filed Vitals:   07/16/15 1319  BP: 179/67  Pulse: 67  Temp: 36.7 C  Resp: 15    Complications: No apparent anesthesia complications

## 2015-07-16 NOTE — Op Note (Signed)
[facility] [place], [address]   COLONOSCOPY PROCEDURE REPORT  PATIENT: [name] [name], [name] [name]  MR#: [id number] BIRTHDATE: 1930/03/11 , 84  yrs. old GENDER: male ENDOSCOPIST: Carol Ada, MD REFERRED BY: PROCEDURE DATE:  06-Aug-2015 PROCEDURE:   Colonoscopy with control of bleeding ASA CLASS:   Class III INDICATIONS: Iron Deficiency Anemia and Diarrhea MEDICATIONS: Monitored anesthesia care  DESCRIPTION OF PROCEDURE:   After the risks and benefits and of the procedure were explained, informed consent was obtained.  revealed no abnormalities of the rectum.    The Pentax Ped Colon [id number] endoscope was introduced through the anus and advanced to the cecum, which was identified by both the appendix and ileocecal valve .  The quality of the prep was good. .  The instrument was then slowly withdrawn as the colon was fully examined. Estimated blood loss is zero unless otherwise noted in this procedure report.   FINDINGS: From the cecum to the splenic flexure the colonic mucosa was normal.  Random cold biopsies were obtained to evaluate his complaints of diarrhea.  In the left side of the colon the mucosa was markedly edematous and there were a few areas of mild nonspecific erythema.  Two cold biopsies were obtained and both sites bled rather profusely.  One site did arrest spontaneously but the second biopsy site continued to bleed brisly.  A hemoclip was deployed across the site and biopsy site stopped bleeding.  No further biopsies were attempted in the left side of the colon.  A few diverticula were also noted.     Retroflexed views revealed no abnormalities.     The scope was then withdrawn from the patient and the procedure completed.  WITHDRAWAL TIME: 16 minutes  COMPLICATIONS: There were no immediate complications. ENDOSCOPIC IMPRESSION: 1) Left sided mucosal congestions and mild erythema. 2) Scattered left-sided  diverticula.  RECOMMENDATIONS: 1) Follow up biopsy results.  REPEAT EXAM:  cc:  _______________________________ eSignedCarol Ada, MD 08-06-15 2:21 PM   CPT CODES: ICD CODES:  The ICD and CPT codes recommended by this software are interpretations from the data that the clinical staff has captured with the software.  The verification of the translation of this report to the ICD and CPT codes and modifiers is the sole responsibility of the health care institution and practicing physician where this report was generated.  [employer]. will not be held responsible for the validity of the ICD and CPT codes included on this report.  AMA assumes no liability for data contained or not contained herein. CPT is a Designer, television/film set of the [employer].   PATIENT NAME:  [name] [name], [name] [name] MR#: [id number]

## 2015-07-16 NOTE — Op Note (Signed)
[facility] [place], [address]   ENDOSCOPY PROCEDURE REPORT  PATIENT: [name] [name], [name] [name]  MR#: [id number] BIRTHDATE: 03/09/1930 , 53  yrs. old GENDER: male ENDOSCOPIST:Dhanush Jokerst Benson Norway, MD REFERRED BY: PROCEDURE DATE:  13-Aug-2015 PROCEDURE:   EGD w/ biopsy ASA CLASS:    Class III INDICATIONS:  Iron Deficiency Anemia MEDICATION: Monitored anesthesia care TOPICAL ANESTHETIC:   none  DESCRIPTION OF PROCEDURE:   After the risks and benefits of the procedure were explained, informed consent was obtained.  The endoscope was introduced through the mouth  and advanced to the second portion of the duodenum .  The instrument was slowly withdrawn as the mucosa was fully examined. Estimated blood loss is zero unless otherwise noted in this procedure report.   FINDINGS: The esophagus and gastroesophageal junction were completely normal in appearance.  The stomach was entered and closely examined.The antrum, angularis, and lesser curvature were well visualized, including a retroflexed view of the cardia and fundus.  The stomach wall was normally distensable.  The scope passed easily through the pylorus into the duodenum.  No overt abnormalities were identified.  Cold biopsies of the duodenum were obtained to evaluate for Celiac disease.    Retroflexed views revealed no abnormalities.    The scope was then withdrawn from the patient and the procedure completed.  COMPLICATIONS: There were no immediate complications.  ENDOSCOPIC IMPRESSION: 1) Normal EGD.  RECOMMENDATIONS: 1) Follow up biopsy results. 2) Proceed with the colonoscopy.   _______________________________ eSigned:  Carol Ada, MD 08/13/15 2:16 PM     cc:  CPT CODES: ICD CODES:  The ICD and CPT codes recommended by this software are interpretations from the data that the clinical staff has captured with the software.  The verification of the translation of this report to the ICD and  CPT codes and modifiers is the sole responsibility of the health care institution and practicing physician where this report was generated.  [employer]. will not be held responsible for the validity of the ICD and CPT codes included on this report.  AMA assumes no liability for data contained or not contained herein. CPT is a Designer, television/film set of the [employer].

## 2015-07-16 NOTE — Op Note (Signed)
Pt had a run of ventricle trigeminy.  Dr. Benson Norway and Dr. Verlee Rossetti notified.  Pt. Asymtomatic.  Pt. Assessed by Dr. Verlee Rossetti and ok'd for discharge.  Laverta Baltimore, RN

## 2015-07-16 NOTE — H&P (View-Only) (Signed)
Spoke with wife says no mention of pulmonary hypertension for medical history for her husband that she can remember

## 2015-07-16 NOTE — Discharge Instructions (Signed)
Esophagogastroduodenoscopy Care After Refer to this sheet in the next few weeks. These instructions provide you with information on caring for yourself after your procedure. Your caregiver may also give you more specific instructions. Your treatment has been planned according to current medical practices, but problems sometimes occur. Call your caregiver if you have any problems or questions after your procedure.  HOME CARE INSTRUCTIONS  Do not eat or drink anything until the numbing medicine (local anesthetic) has worn off and your gag reflex has returned. You will know that the local anesthetic has worn off when you can swallow comfortably.  Do not drive for 12 hours after the procedure or as directed by your caregiver.  Only take medicines as directed by your caregiver. SEEK MEDICAL CARE IF:   You cannot stop coughing.  You are not urinating at all or less than usual. SEEK IMMEDIATE MEDICAL CARE IF:  You have difficulty swallowing.  You cannot eat or drink.  You have worsening throat or chest pain.  You have dizziness, lightheadedness, or you faint.  You have nausea or vomiting.  You have chills.  You have a fever.  You have severe abdominal pain.  You have black, tarry, or bloody stools. Document Released: 11/07/2012 Document Reviewed: 11/07/2012 [facility] Patient Information 2015 [place]. This information is not intended to replace advice given to you by your health care provider. Make sure you discuss any questions you have with your health care provider.  Colonoscopy, Care After Refer to this sheet in the next few weeks. These instructions provide you with information on caring for yourself after your procedure. Your health care provider may also give you more specific instructions. Your treatment has been planned according to current medical practices, but problems sometimes occur. Call your health care provider if you have any problems or questions after your  procedure. WHAT TO EXPECT AFTER THE PROCEDURE  After your procedure, it is typical to have the following:  A small amount of blood in your stool.  Moderate amounts of gas and mild abdominal cramping or bloating. HOME CARE INSTRUCTIONS  Do not drive, operate machinery, or sign important documents for 24 hours.  You may shower and resume your regular physical activities, but move at a slower pace for the first 24 hours.  Take frequent rest periods for the first 24 hours.  Walk around or put a warm pack on your abdomen to help reduce abdominal cramping and bloating.  Drink enough fluids to keep your urine clear or pale yellow.  You may resume your normal diet as instructed by your health care provider. Avoid heavy or fried foods that are hard to digest.  Avoid drinking alcohol for 24 hours or as instructed by your health care provider.  Only take over-the-counter or prescription medicines as directed by your health care provider.  If a tissue sample (biopsy) was taken during your procedure:  Do not take aspirin or blood thinners for 7 days, or as instructed by your health care provider.  Do not drink alcohol for 7 days, or as instructed by your health care provider.  Eat soft foods for the first 24 hours. SEEK MEDICAL CARE IF: You have persistent spotting of blood in your stool 2-3 days after the procedure. SEEK IMMEDIATE MEDICAL CARE IF:  You have more than a small spotting of blood in your stool.  You pass large blood clots in your stool.  Your abdomen is swollen (distended).  You have nausea or vomiting.  You have a  fever.  You have increasing abdominal pain that is not relieved with medicine. Document Released: 07/05/2004 Document Revised: 09/11/2013 Document Reviewed: 07/29/2013 [facility] Patient Information 2015 [place], [place]. This information is not intended to replace advice given to you by your health care provider. Make sure you discuss any questions you have  with your health care provider.   Monitored Anesthesia Care Monitored anesthesia care is an anesthesia service for a medical procedure. Anesthesia is the loss of the ability to feel pain. It is produced by medicines called anesthetics. It may affect a small area of your body (local anesthesia), a large area of your body (regional anesthesia), or your entire body (general anesthesia). The need for monitored anesthesia care depends your procedure, your condition, and the potential need for regional or general anesthesia. It is often provided during procedures where:   General anesthesia may be needed if there are complications. This is because you need special care when you are under general anesthesia.   You will be under local or regional anesthesia. This is so that you are able to have higher levels of anesthesia if needed.   You will receive calming medicines (sedatives). This is especially the case if sedatives are given to put you in a semi-conscious state of relaxation (deep sedation). This is because the amount of sedative needed to produce this state can be hard to predict. Too much of a sedative can produce general anesthesia. Monitored anesthesia care is performed by one or more health care providers who have special training in all types of anesthesia. You will need to meet with these health care providers before your procedure. During this meeting, they will ask you about your medical history. They will also give you instructions to follow. (For example, you will need to stop eating and drinking before your procedure. You may also need to stop or change medicines you are taking.) During your procedure, your health care providers will stay with you. They will:   Watch your condition. This includes watching your blood pressure, breathing, and level of pain.   Diagnose and treat problems that occur.   Give medicines if they are needed. These may include calming medicines (sedatives) and  anesthetics.   Make sure you are comfortable.  Having monitored anesthesia care does not necessarily mean that you will be under anesthesia. It does mean that your health care providers will be able to manage anesthesia if you need it or if it occurs. It also means that you will be able to have a different type of anesthesia than you are having if you need it. When your procedure is complete, your health care providers will continue to watch your condition. They will make sure any medicines wear off before you are allowed to go home.  Document Released: 08/17/2005 Document Revised: 04/07/2014 Document Reviewed: 01/02/2013 [facility] Patient Information 2015 [place], [place]. This information is not intended to replace advice given to you by your health care provider. Make sure you discuss any questions you have with your health care provider.

## 2015-07-17 ENCOUNTER — Encounter (HOSPITAL_COMMUNITY): Payer: Self-pay | Admitting: Gastroenterology

## 2015-07-24 ENCOUNTER — Other Ambulatory Visit: Payer: Self-pay

## 2015-07-24 ENCOUNTER — Telehealth: Payer: Self-pay | Admitting: Cardiology

## 2015-07-24 DIAGNOSIS — I480 Paroxysmal atrial fibrillation: Secondary | ICD-10-CM

## 2015-07-24 MED ORDER — APIXABAN 5 MG PO TABS: 5.0000 mg | ORAL_TABLET | Freq: Two times a day (BID) | ORAL | Status: DC

## 2015-07-24 NOTE — Telephone Encounter (Deleted)
ERROR

## 2015-07-24 NOTE — Telephone Encounter (Signed)
Pt called requesting samples of eliquis 2.5 mg, left them up front for him

## 2015-07-24 NOTE — Progress Notes (Signed)
Eliquis was changed back to 5 mg Twice daily based upon improved renal function. See lab results 06/11/15 (BMET).  He should be on Eliquis 5 mg Twice daily  Please make sure chart is correct.  Richardson Dopp, PA-C   07/24/2015 3:38 PM

## 2015-07-30 ENCOUNTER — Other Ambulatory Visit: Payer: Self-pay

## 2015-07-30 ENCOUNTER — Ambulatory Visit (HOSPITAL_COMMUNITY): Payer: Medicare HMO | Attending: Physician Assistant

## 2015-07-30 DIAGNOSIS — E119 Type 2 diabetes mellitus without complications: Secondary | ICD-10-CM | POA: Diagnosis not present

## 2015-07-30 DIAGNOSIS — I509 Heart failure, unspecified: Secondary | ICD-10-CM | POA: Diagnosis not present

## 2015-07-30 DIAGNOSIS — I77819 Aortic ectasia, unspecified site: Secondary | ICD-10-CM | POA: Diagnosis not present

## 2015-07-30 DIAGNOSIS — I4891 Unspecified atrial fibrillation: Secondary | ICD-10-CM | POA: Diagnosis present

## 2015-07-30 DIAGNOSIS — I351 Nonrheumatic aortic (valve) insufficiency: Secondary | ICD-10-CM | POA: Insufficient documentation

## 2015-07-30 DIAGNOSIS — I340 Nonrheumatic mitral (valve) insufficiency: Secondary | ICD-10-CM | POA: Diagnosis not present

## 2015-07-30 DIAGNOSIS — I517 Cardiomegaly: Secondary | ICD-10-CM | POA: Diagnosis not present

## 2015-07-30 DIAGNOSIS — E785 Hyperlipidemia, unspecified: Secondary | ICD-10-CM | POA: Insufficient documentation

## 2015-07-30 DIAGNOSIS — I5042 Chronic combined systolic (congestive) and diastolic (congestive) heart failure: Secondary | ICD-10-CM | POA: Diagnosis not present

## 2015-07-30 DIAGNOSIS — I10 Essential (primary) hypertension: Secondary | ICD-10-CM | POA: Diagnosis not present

## 2015-07-30 DIAGNOSIS — I480 Paroxysmal atrial fibrillation: Secondary | ICD-10-CM

## 2015-07-31 ENCOUNTER — Telehealth: Payer: Self-pay | Admitting: *Deleted

## 2015-07-31 ENCOUNTER — Encounter: Payer: Self-pay | Admitting: Physician Assistant

## 2015-07-31 NOTE — Telephone Encounter (Signed)
lmptcb to go over echo results  

## 2015-08-04 ENCOUNTER — Telehealth: Payer: Self-pay | Admitting: Physician Assistant

## 2015-08-04 NOTE — Telephone Encounter (Signed)
New message ° ° ° ° °Pt calling in regards to test results °

## 2015-08-04 NOTE — Telephone Encounter (Signed)
Pt notified of echo results by phone with verbal understanding 

## 2015-08-17 ENCOUNTER — Telehealth: Payer: Self-pay | Admitting: Oncology

## 2015-08-17 NOTE — Telephone Encounter (Signed)
CONTACTED PT REGARDING NEW PATIENT APPT ON 9/13 AT 10:45AM, PT CONFIRMED APPT.

## 2015-08-18 ENCOUNTER — Other Ambulatory Visit: Payer: Medicare HMO

## 2015-08-18 ENCOUNTER — Ambulatory Visit (HOSPITAL_BASED_OUTPATIENT_CLINIC_OR_DEPARTMENT_OTHER): Payer: Medicare HMO | Admitting: Oncology

## 2015-08-18 ENCOUNTER — Telehealth: Payer: Self-pay | Admitting: Oncology

## 2015-08-18 VITALS — BP 134/52 | HR 66 | Temp 97.6°F | Resp 17 | Ht 71.0 in | Wt 207.6 lb

## 2015-08-18 DIAGNOSIS — D508 Other iron deficiency anemias: Secondary | ICD-10-CM

## 2015-08-18 MED ORDER — FERROUS SULFATE 325 (65 FE) MG PO TBEC: DELAYED_RELEASE_TABLET | ORAL | Status: DC

## 2015-08-18 NOTE — Telephone Encounter (Signed)
Pt confirmed labs/ov per 09/13 POF, gave pt AVS and Calendar.... KJ °

## 2015-08-18 NOTE — Progress Notes (Signed)
Reason for Referral: Anemia.   HPI: [name] [name] is an 81 year old gentleman with history of hypertension, coronary disease and obesity. He was in his usual state of health until he developed cardiac complications in December 2015. He had paroxysmal atrial fibrillation and systolic and diastolic heart dysfunction. Since that time, he had reported slight decline in his health including a lot of energy and change in his bowel habits. Starting May 2016 he started to have more fatigue and some dyspnea on exertion. His hemoglobin has drifted slowly over the last few months with a hemoglobin of 12.4 in July 2016 down to 9.3 on 08/06/2015. He had a normal white cell count of 7.1 and a platelet count of 186. His MCV is normal. His differential is normal. His iron studies obtained on 07/06/2015 showed iron level to be low at 35, saturation of 17% and TIBC of 208. His ferritin was over 1500. He was evaluated by gastroenterology and underwent an endoscopy and colonoscopy in August 2016 that was unremarkable. Clinically, he continues to have GI symptoms including abdominal pain and diarrhea at times. He does not report any hematochezia or melanoma. He does not report any hematuria or hemoptysis. Does not report any easy bruisability or lymphadenopathy. Does not report any weight loss or constitutional symptoms.  He does not report any headaches, blurry vision, syncope or seizures. He does not report any fevers, chills, sweats. He does not report any chest pain, palpitation orthopnea or leg edema. He does not report any cough, hemoptysis or wheezing. He does not report any nausea, vomiting but does report occasional diarrhea and abdominal pain. He does not report any frequency, urgency or hesitancy. Does not report any skeletal complaints. Remaining review of systems unremarkable.   Past Medical History  Diagnosis Date  . HLD (hyperlipidemia)   . NSTEMI (non-ST elevated myocardial infarction)   . Type II diabetes  mellitus   . Paroxysmal atrial fibrillation     a. diagnosed on 11/2014 admission. Spontaneously converted to NSR. Placed on Eliquis  . Chronic combined systolic and diastolic CHF (congestive heart failure)     a. 2d ECHO 11/24/14 with EF 40-45%, akinesis of the inferior, inferolateral and inferoseptal walls; overall mild to moderate reduction in LV function; grade 2 diastolic dysfunction; severe LAE; trace AI; moderate MR; mild TR with severely elevated pulmonary pressures. b. EF 25% in 04/2015, possibly due to HTN crisis.;  c. Echo 8/16:  Mild LVH, EF 50-55%  . CAD (coronary artery disease)     a. s/p LHC on 11/24/14 w/ mild-mod dz in LAD. CTO of mLCx and OM2 with R-->L and L-->L collaterals. b. Elevated trop 04/2015 felt due to demand ischemia.  . Pulmonary edema   . Renal insufficiency     a. Elev Cr 04/2015 requiring reduction in Eliquis.  . Obesity   . Hypertension   . History of echocardiogram     Echo 8/16:  Mild LVH, EF 50-55%, inf HK, Gr 1 DD, trivial AI, mild dilated aortic root, MAC, mild MR, severe LAE  :  Past Surgical History  Procedure Laterality Date  . Appendectomy    . Total knee arthroplasty Left 2013  . Tonsillectomy    . Joint replacement    . Cardiac catheterization  11/24/2014  . Left heart catheterization with coronary angiogram N/A 11/24/2014    Procedure: LEFT HEART CATHETERIZATION WITH CORONARY ANGIOGRAM;  Surgeon: Jettie Booze, MD;  Location: [facility] CATH LAB;  Service: Cardiovascular;  Laterality: N/A;  . Esophagogastroduodenoscopy (egd)  with propofol N/A 07/16/2015    Procedure: ESOPHAGOGASTRODUODENOSCOPY (EGD) WITH PROPOFOL;  Surgeon: Carol Ada, MD;  Location: [facility] ENDOSCOPY;  Service: Endoscopy;  Laterality: N/A;  . Colonoscopy with propofol N/A 07/16/2015    Procedure: COLONOSCOPY WITH PROPOFOL;  Surgeon: Carol Ada, MD;  Location: [facility] ENDOSCOPY;  Service: Endoscopy;  Laterality: N/A;  :   Current outpatient prescriptions:  .  acetaminophen (TYLENOL)  500 MG tablet, Take 500 mg by mouth every 6 (six) hours as needed for moderate pain., Disp: , Rfl:  .  apixaban (ELIQUIS) 5 MG TABS tablet, Take 1 tablet (5 mg total) by mouth 2 (two) times daily., Disp: 60 tablet, Rfl: 11 .  atorvastatin (LIPITOR) 40 MG tablet, Take 1 tablet (40 mg total) by mouth daily at 6 PM., Disp: 90 tablet, Rfl: 3 .  carvedilol (COREG) 6.25 MG tablet, Take 1 tablet (6.25 mg total) by mouth 2 (two) times daily with a meal., Disp: 60 tablet, Rfl: 3 .  furosemide (LASIX) 40 MG tablet, Take 1 tablet 40 mg in AM .Take 1 extra tablet daily if weight is up 3 pounds or more. (Patient taking differently: Take 40 mg by mouth 2 (two) times daily. Take 1 extra tablet daily if weight is up 3 pounds or more.), Disp: , Rfl:  .  metFORMIN (GLUCOPHAGE) 1000 MG tablet, Take 1 tablet (1,000 mg total) by mouth 2 (two) times daily with a meal., Disp: 60 tablet, Rfl: 11 .  multivitamin-iron-minerals-folic acid (CENTRUM) chewable tablet, Chew 1 tablet by mouth daily., Disp: , Rfl:  .  nitroGLYCERIN (NITROSTAT) 0.4 MG SL tablet, Place 1 tablet (0.4 mg total) under the tongue every 5 (five) minutes x 3 doses as needed for chest pain., Disp: 25 tablet, Rfl: 12 .  ramipril (ALTACE) 10 MG capsule, Take 10 mg by mouth daily., Disp: , Rfl:  .  spironolactone (ALDACTONE) 25 MG tablet, Take 0.5 tablets (12.5 mg total) by mouth daily., Disp: 30 tablet, Rfl: 3 .  tamsulosin (FLOMAX) 0.4 MG CAPS capsule, Take 0.4 mg by mouth daily as needed (BPH)., Disp: , Rfl:  .  vitamin B-12 (CYANOCOBALAMIN) 1000 MCG tablet, Take 1,000 mcg by mouth daily., Disp: , Rfl:  .  ferrous sulfate 325 (65 FE) MG EC tablet, Take twice a day with orange juice., Disp: 120 tablet, Rfl: 3:  No Known Allergies:  Family History  Problem Relation Age of Onset  . Cancer Father 35    Deceased  . Stroke Mother     Deceased  . Heart attack Brother     Deceased. Had cath with stents followed by PE  . Hypertension Neg Hx   :  Social  History   Social History  . Marital Status: Married    Spouse Name: N/A  . Number of Children: N/A  . Years of Education: N/A   Occupational History  . Not on file.   Social History Main Topics  . Smoking status: Never Smoker   . Smokeless tobacco: Never Used  . Alcohol Use: 8.4 oz/week    7 Shots of liquor, 7 Standard drinks or equivalent per week     Comment: Daily  . Drug Use: No  . Sexual Activity: Not Currently   Other Topics Concern  . Not on file   Social History Narrative  :  Pertinent items are noted in HPI.  Exam: Blood pressure 134/52, pulse 66, temperature 97.6 F (36.4 C), temperature source Oral, resp. rate 17, height $RemoveBe'5\' 11"'DkUToOVvH$  (1.803 m), weight 207  lb 9.6 oz (94.167 kg), SpO2 99 %. General appearance: alert and cooperative Head: Normocephalic, without obvious abnormality Throat: lips, mucosa, and tongue normal; teeth and gums normal Neck: no adenopathy Back: negative Resp: clear to auscultation bilaterally Chest wall: no tenderness Cardio: regular rate and rhythm, S1, S2 normal, no murmur, click, rub or gallop GI: soft, non-tender; bowel sounds normal; no masses,  no organomegaly Extremities: extremities normal, atraumatic, no cyanosis or edema Pulses: 2+ and symmetric Skin: Skin color, texture, turgor normal. No rashes or lesions Lymph nodes: Cervical, supraclavicular, and axillary nodes normal. Neurologic: No deficits noted.   CBC    Component Value Date/Time   WBC 8.2 07/06/2015 1053   RBC 3.55* 07/06/2015 1053   HGB 10.5* 07/06/2015 1053   HCT 31.6* 07/06/2015 1053   PLT 245.0 07/06/2015 1053   MCV 89.0 07/06/2015 1053   MCH 29.9 04/24/2015 0353   MCHC 33.4 07/06/2015 1053   RDW 15.1 07/06/2015 1053   LYMPHSABS 1.1 07/06/2015 1053   MONOABS 0.7 07/06/2015 1053   EOSABS 0.1 07/06/2015 1053   BASOSABS 0.0 07/06/2015 1053      Chemistry      Component Value Date/Time   NA 141 06/22/2015 1201   K 4.5 06/22/2015 1201   CL 105 06/22/2015  1201   CO2 27 06/22/2015 1201   BUN 34* 06/22/2015 1201   CREATININE 1.48 06/22/2015 1201      Component Value Date/Time   CALCIUM 9.4 06/22/2015 1201   ALKPHOS 60 04/23/2015 1115   AST 26 04/23/2015 1115   ALT 15* 04/23/2015 1115   BILITOT 1.5* 04/23/2015 1115       Assessment and Plan:   81 year old gentleman with the following issues:  1. Normocytic, normochromic anemia diagnosed starting in July 2016 and have been drifting slowly in the last few months. His workup did reveal some mild iron deficiency and his GI workup did not reveal any source for bleeding. The differential diagnosis was discussed with the patient today. He certainly has an element of iron deficiency documented by low iron and saturations. He could have also multifactorial anemia possibly anemia of chronic disease, renal disease or myelodysplasia. Plasma cell disorder is less likely.  From a management standpoint, I have recommended oral iron replacement in the form of iron sulfate 325 mg twice a day for the next 2 months. I instructed him to take this medication with vitamin C and to avoid antiacids. If this maneuver does not improve his hemoglobin, IV iron could be considered. I will also obtain a workup for other causes of anemia including a serum protein electrophoresis as well as an erythropoietin level. He will follow-up after 2 months of oral iron to see if there is any response.  If his iron levels are adequate and continues to have unexplained anemia, a bone marrow biopsy will be contemplated at that time.  2. Abdominal discomfort and diarrhea: She is following up with gastroenterology regarding this issue. His workup is unrevealing at this time.  3. Follow-up: Will be in 2 months to recheck his iron stores and discuss next steps.

## 2015-09-04 ENCOUNTER — Encounter: Payer: Self-pay | Admitting: Cardiology

## 2015-09-04 ENCOUNTER — Telehealth: Payer: Self-pay | Admitting: *Deleted

## 2015-09-04 ENCOUNTER — Ambulatory Visit (INDEPENDENT_AMBULATORY_CARE_PROVIDER_SITE_OTHER): Payer: Medicare HMO | Admitting: Cardiology

## 2015-09-04 VITALS — BP 128/62 | HR 76 | Ht 71.0 in | Wt 211.0 lb

## 2015-09-04 DIAGNOSIS — I2510 Atherosclerotic heart disease of native coronary artery without angina pectoris: Secondary | ICD-10-CM

## 2015-09-04 DIAGNOSIS — N138 Other obstructive and reflux uropathy: Secondary | ICD-10-CM | POA: Insufficient documentation

## 2015-09-04 DIAGNOSIS — L989 Disorder of the skin and subcutaneous tissue, unspecified: Secondary | ICD-10-CM | POA: Insufficient documentation

## 2015-09-04 DIAGNOSIS — L570 Actinic keratosis: Secondary | ICD-10-CM | POA: Insufficient documentation

## 2015-09-04 DIAGNOSIS — J9620 Acute and chronic respiratory failure, unspecified whether with hypoxia or hypercapnia: Secondary | ICD-10-CM | POA: Insufficient documentation

## 2015-09-04 DIAGNOSIS — N183 Chronic kidney disease, stage 3 unspecified: Secondary | ICD-10-CM | POA: Insufficient documentation

## 2015-09-04 DIAGNOSIS — D649 Anemia, unspecified: Secondary | ICD-10-CM

## 2015-09-04 DIAGNOSIS — E785 Hyperlipidemia, unspecified: Secondary | ICD-10-CM

## 2015-09-04 DIAGNOSIS — I5023 Acute on chronic systolic (congestive) heart failure: Secondary | ICD-10-CM

## 2015-09-04 DIAGNOSIS — L820 Inflamed seborrheic keratosis: Secondary | ICD-10-CM | POA: Insufficient documentation

## 2015-09-04 DIAGNOSIS — I480 Paroxysmal atrial fibrillation: Secondary | ICD-10-CM

## 2015-09-04 DIAGNOSIS — M1990 Unspecified osteoarthritis, unspecified site: Secondary | ICD-10-CM | POA: Insufficient documentation

## 2015-09-04 DIAGNOSIS — R109 Unspecified abdominal pain: Secondary | ICD-10-CM | POA: Insufficient documentation

## 2015-09-04 DIAGNOSIS — I10 Essential (primary) hypertension: Secondary | ICD-10-CM

## 2015-09-04 DIAGNOSIS — B351 Tinea unguium: Secondary | ICD-10-CM | POA: Insufficient documentation

## 2015-09-04 DIAGNOSIS — N529 Male erectile dysfunction, unspecified: Secondary | ICD-10-CM | POA: Insufficient documentation

## 2015-09-04 DIAGNOSIS — R319 Hematuria, unspecified: Secondary | ICD-10-CM | POA: Insufficient documentation

## 2015-09-04 DIAGNOSIS — I2583 Coronary atherosclerosis due to lipid rich plaque: Secondary | ICD-10-CM

## 2015-09-04 DIAGNOSIS — I5042 Chronic combined systolic (congestive) and diastolic (congestive) heart failure: Secondary | ICD-10-CM

## 2015-09-04 DIAGNOSIS — N401 Enlarged prostate with lower urinary tract symptoms: Secondary | ICD-10-CM

## 2015-09-04 DIAGNOSIS — R799 Abnormal finding of blood chemistry, unspecified: Secondary | ICD-10-CM | POA: Insufficient documentation

## 2015-09-04 DIAGNOSIS — R197 Diarrhea, unspecified: Secondary | ICD-10-CM | POA: Insufficient documentation

## 2015-09-04 LAB — CBC WITH DIFFERENTIAL/PLATELET
Basophils Absolute: 0 10*3/uL (ref 0.0–0.1)
Basophils Relative: 0.6 % (ref 0.0–3.0)
Eosinophils Absolute: 0.1 10*3/uL (ref 0.0–0.7)
Eosinophils Relative: 1.7 % (ref 0.0–5.0)
HCT: 33 % — ABNORMAL LOW (ref 39.0–52.0)
Hemoglobin: 10.8 g/dL — ABNORMAL LOW (ref 13.0–17.0)
Lymphocytes Relative: 21.6 % (ref 12.0–46.0)
Lymphs Abs: 1.6 10*3/uL (ref 0.7–4.0)
MCHC: 32.5 g/dL (ref 30.0–36.0)
MCV: 90 fl (ref 78.0–100.0)
Monocytes Absolute: 0.5 10*3/uL (ref 0.1–1.0)
Monocytes Relative: 6.5 % (ref 3.0–12.0)
Neutro Abs: 5.3 10*3/uL (ref 1.4–7.7)
Neutrophils Relative %: 69.6 % (ref 43.0–77.0)
Platelets: 194 10*3/uL (ref 150.0–400.0)
RBC: 3.67 Mil/uL — ABNORMAL LOW (ref 4.22–5.81)
RDW: 15.7 % — ABNORMAL HIGH (ref 11.5–15.5)
WBC: 7.6 10*3/uL (ref 4.0–10.5)

## 2015-09-04 LAB — COMPREHENSIVE METABOLIC PANEL
ALT: 9 U/L (ref 0–53)
AST: 15 U/L (ref 0–37)
Albumin: 3.8 g/dL (ref 3.5–5.2)
Alkaline Phosphatase: 51 U/L (ref 39–117)
BUN: 30 mg/dL — ABNORMAL HIGH (ref 6–23)
CO2: 28 mEq/L (ref 19–32)
Calcium: 9.2 mg/dL (ref 8.4–10.5)
Chloride: 104 mEq/L (ref 96–112)
Creatinine, Ser: 1.57 mg/dL — ABNORMAL HIGH (ref 0.40–1.50)
GFR: 45.07 mL/min — ABNORMAL LOW (ref >60.00)
Glucose, Bld: 138 mg/dL — ABNORMAL HIGH (ref 70–99)
Potassium: 4.2 mEq/L (ref 3.5–5.1)
Sodium: 141 mEq/L (ref 135–145)
Total Bilirubin: 0.6 mg/dL (ref 0.2–1.2)
Total Protein: 7 g/dL (ref 6.0–8.3)

## 2015-09-04 MED ORDER — APIXABAN 2.5 MG PO TABS: 2.5000 mg | ORAL_TABLET | Freq: Two times a day (BID) | ORAL | Status: DC

## 2015-09-04 NOTE — Patient Instructions (Signed)
Your physician recommends that you continue on your current medications as directed. Please refer to the Current Medication list given to you today.     Your physician recommends that you return for lab work in: TODAY---CMET AND CBC W DIFF    Your physician recommends that you schedule a follow-up appointment in: [place]

## 2015-09-04 NOTE — Telephone Encounter (Signed)
Notified the pt that per Dr Meda Coffee his hemoglobin is 10.8, and we can restart his Eliquis 2.5 mg po bid and recheck a cbc w diff in one month.  Confirmed the pharmacy of choice with the pt.  Scheduled the pt a lab appt for 10/05/15 at our office to recheck a cbc w diff.  Pt verbalized understanding and agrees with this plan.

## 2015-09-04 NOTE — Progress Notes (Signed)
Patient ID: [name] [name], male   DOB: October 03, 1930, 81 y.o.   MRN: [id number]    Cardiology Office Note   Date:  09/04/2015   ID:  [name] [name], DOB 27-Sep-1930, MRN [id number]  PCP:  Stephens Shire, MD  Cardiologist:  Dr. Ena Dawley     No chief complaint on file.    History of Present Illness: [name] [name] is a 81 y.o. male with a hx of diabetes, HTN, BPH. He was admitted in December 5573 with systolic/diastolic HF in the setting of AF with RVR. Cardiac enzymes were elevated consistent with a non-STEMI. Cardiac catheterization demonstrated moderate disease in the LAD, chronic total occlusion of the mid left circumflex and OM2 branch with right to left and left to left collaterals and moderate disease in the RCA. Medical therapy was recommended. CTO PCI of the LCx could be considered if the patient had symptoms of refractory angina. He converted to NSR on diltiazem drip. EF is 40 at 45% by echocardiogram.    Admitted 04/2015 with acute on chronic respiratory failure in the setting of acute on chronic combined systolic and diastolic CHF and hypertensive emergency.  His systolic blood pressure was over 200 upon presentation. He developed acute kidney injury on top of chronic kidney disease with IV diuresis. He had troponin elevation felt to be related to respiratory failure, hypertensive emergency and acute kidney injury. It was felt that outpatient stress testing could be considered. Repeat echo demonstrated worsening LV function with EF 30%. This was reviewed by cardiology in the hospital and felt to be likely depressed from hypertensive crisis. CHF medications were adjusted. It was also recommended to consider an outpatient sleep test to rule out sleep apnea. Of note, his Eliquis was reduced to 2.5 mg twice a day due to worsening renal function and age greater than 20.  Seen in FU 04/29/15.  I did not think that he needed to undergo stress testing at that time.  My plan is to repeat an Echo in  August to assess for recovery of LVF.  If EF does not recover, consider further testing.    09/04/15 - He returns for FU. Saw Richardson Dopp in August. He is doing well, no chest pain, DOE, no LE edema, orthopnea, PND.  He goes bowling often.  He was found to be anemic and his eliquis was stopped, he is on iron replacement therapy.  Studies/Reports Reviewed Today:  Echo 04/25/15 - Mild LVH. EF 30%. Severeglobal HK. Grade 1 DD.  - Ventricular Septal motion withdyssynergy consistent with intraventricularconduction delay. - Trivial AI - Mild aortic root dilatation.   42 mm(ED). - Ascending aorta 3.8 cm in maximal diameter. - MAC, mild MR - severe LAE. Volume/bsa, S: 46.15ml/m^2. - mild RAE - Mild TR, Trivial PI. Impressions:- When compared to the report dated 03/05/15, LVEF has significantlydeclined.  Echo 03/05/15 - EF 40% to 45%. There is hypokinesis of the inferiormyocardium. Grade 2 diastolic dysfunction). - Aortic valve: There was mild regurgitation. - Mitral valve: Calcified annulus. There was moderateregurgitation. - Left atrium: The atrium was moderately dilated. - Right ventricle: The cavity size was mildly dilated. Wallthickness was normal. - PA peak pressure: 41 mm Hg (S). Impressions: Compared to the prior study, there has been no significantinterval change.  LHC 11/24/14 LM: Patent LAD: Patent LCx: Occluded with right to left collaterals RCA: Mid moderate diffuse disease PCI: Unsuccessful attempt at PCI of the LCx-chronic total occlusion   Past Medical History  Diagnosis Date  . HLD (hyperlipidemia)   .  NSTEMI (non-ST elevated myocardial infarction)   . Type II diabetes mellitus   . Paroxysmal atrial fibrillation     a. diagnosed on 11/2014 admission. Spontaneously converted to NSR. Placed on Eliquis  . Chronic combined systolic and diastolic CHF (congestive heart failure)     a. 2d ECHO 11/24/14 with EF 40-45%, akinesis of the inferior, inferolateral and  inferoseptal walls; overall mild to moderate reduction in LV function; grade 2 diastolic dysfunction; severe LAE; trace AI; moderate MR; mild TR with severely elevated pulmonary pressures. b. EF 25% in 04/2015, possibly due to HTN crisis.;  c. Echo 8/16:  Mild LVH, EF 50-55%  . CAD (coronary artery disease)     a. s/p LHC on 11/24/14 w/ mild-mod dz in LAD. CTO of mLCx and OM2 with R-->L and L-->L collaterals. b. Elevated trop 04/2015 felt due to demand ischemia.  . Pulmonary edema   . Renal insufficiency     a. Elev Cr 04/2015 requiring reduction in Eliquis.  . Obesity   . Hypertension   . History of echocardiogram     Echo 8/16:  Mild LVH, EF 50-55%, inf HK, Gr 1 DD, trivial AI, mild dilated aortic root, MAC, mild MR, severe LAE    Past Surgical History  Procedure Laterality Date  . Appendectomy    . Total knee arthroplasty Left 2013  . Tonsillectomy    . Joint replacement    . Cardiac catheterization  11/24/2014  . Left heart catheterization with coronary angiogram N/A 11/24/2014    Procedure: LEFT HEART CATHETERIZATION WITH CORONARY ANGIOGRAM;  Surgeon: Jettie Booze, MD;  Location: [facility] CATH LAB;  Service: Cardiovascular;  Laterality: N/A;  . Esophagogastroduodenoscopy (egd) with propofol N/A 07/16/2015    Procedure: ESOPHAGOGASTRODUODENOSCOPY (EGD) WITH PROPOFOL;  Surgeon: Carol Ada, MD;  Location: [facility] ENDOSCOPY;  Service: Endoscopy;  Laterality: N/A;  . Colonoscopy with propofol N/A 07/16/2015    Procedure: COLONOSCOPY WITH PROPOFOL;  Surgeon: Carol Ada, MD;  Location: [facility] ENDOSCOPY;  Service: Endoscopy;  Laterality: N/A;     Current Outpatient Prescriptions  Medication Sig Dispense Refill  . acetaminophen (TYLENOL) 500 MG tablet Take 500 mg by mouth every 6 (six) hours as needed for moderate pain.    Marland Kitchen atorvastatin (LIPITOR) 40 MG tablet Take 1 tablet (40 mg total) by mouth daily at 6 PM. 90 tablet 3  . carvedilol (COREG) 6.25 MG tablet Take 1 tablet (6.25 mg total) by mouth  2 (two) times daily with a meal. 60 tablet 3  . Eluxadoline (VIBERZI PO) Take 1 tablet by mouth every other day.    . ferrous sulfate 325 (65 FE) MG EC tablet Take twice a day with orange juice. 120 tablet 3  . furosemide (LASIX) 40 MG tablet Take 40 mg by mouth 2 (two) times daily. Take one extra tablet daily for weight up 3 or more pounds    . metFORMIN (GLUCOPHAGE) 1000 MG tablet Take 1,000 mg by mouth daily with breakfast.    . multivitamin-iron-minerals-folic acid (CENTRUM) chewable tablet Chew 1 tablet by mouth daily.    . nitroGLYCERIN (NITROSTAT) 0.4 MG SL tablet Place 1 tablet (0.4 mg total) under the tongue every 5 (five) minutes x 3 doses as needed for chest pain. 25 tablet 12  . ramipril (ALTACE) 10 MG capsule Take 10 mg by mouth daily.    Marland Kitchen spironolactone (ALDACTONE) 25 MG tablet Take 0.5 tablets (12.5 mg total) by mouth daily. 30 tablet 3  . tamsulosin (FLOMAX) 0.4 MG CAPS capsule Take  0.4 mg by mouth daily as needed (BPH).    . vitamin B-12 (CYANOCOBALAMIN) 1000 MCG tablet Take 1,000 mcg by mouth daily.    Marland Kitchen apixaban (ELIQUIS) 5 MG TABS tablet Take 1 tablet (5 mg total) by mouth 2 (two) times daily. (Patient not taking: Reported on 09/04/2015) 60 tablet 11   No current facility-administered medications for this visit.    Allergies:   Lisinopril    Social History:  The patient  reports that he has never smoked. He has never used smokeless tobacco. He reports that he drinks about 8.4 oz of alcohol per week. He reports that he does not use illicit drugs.   Family History:  The patient's family history includes Cancer (age of onset: 7) in his father; Heart attack in his brother; Stroke in his mother. There is no history of Hypertension.    ROS:   Please see the history of present illness.   Review of Systems  Cardiovascular: Positive for dyspnea on exertion.  Hematologic/Lymphatic: Bruises/bleeds easily.  All other systems reviewed and are negative.     PHYSICAL EXAM: VS:   BP 128/62 mmHg  Pulse 76  Ht 5\' 11"  (1.803 m)  Wt 211 lb (95.709 kg)  BMI 29.44 kg/m2    Wt Readings from Last 3 Encounters:  09/04/15 211 lb (95.709 kg)  08/18/15 207 lb 9.6 oz (94.167 kg)  07/16/15 211 lb (95.709 kg)     GEN: Well nourished, well developed, in no acute distress HEENT: normal Neck: no JVD,  no masses Cardiac:  Normal S1/S2, RRR; no murmur ,  no rubs or gallops, trace bilateral LE edema  Respiratory:  clear to auscultation bilaterally, no wheezing, rhonchi or rales. GI: soft, nontender, nondistended, + BS MS: no deformity or atrophy Skin: warm and dry  Neuro:  CNs II-XII intact, Strength and sensation are intact Psych: Normal affect   EKG:  EKG is ordered today.  It demonstrates:   NSR, HR 73, LAD, inf-lat TWI, PAC, no changes   Recent Labs: 11/23/2014: TSH 5.210* 04/23/2015: ALT 15*; B Natriuretic Peptide 1076.3* 04/29/2015: Pro B Natriuretic peptide (BNP) 113.0* 06/22/2015: BUN 34*; Creatinine, Ser 1.48; Potassium 4.5; Sodium 141 07/06/2015: Hemoglobin 10.5*; Platelets 245.0    Lipid Panel    Component Value Date/Time   CHOL 173 11/23/2014 0903   TRIG 182* 11/23/2014 0903   HDL 48 11/23/2014 0903   CHOLHDL 3.6 11/23/2014 0903   VLDL 36 11/23/2014 0903   LDLCALC 89 11/23/2014 0903      ASSESSMENT AND PLAN:  Chronic combined systolic and diastolic heart failure:   Doing well.  He is overall NYHA 2b.  His most recent echo on 07/30/15 showed improved LVEF 30-->50%. Thought to be related to hypertensive emergency. He did have elevated troponins. Stress testing was considered. I do not think he needs stress testing. He denies any symptoms of angina.  He will need a repeat echocardiogram in August. If his ejection fraction does not improve, consider cardiac catheterization versus referral to EP for ICD implantation. Check FU BMET today.   Anemia - Eliquis held, we will recheck CBC today, if Hb imprived, restart Eliquis 2.5 mg po BID, if not follow up with  hem-onc and start aspirin  Coronary artery disease involving native coronary artery of native heart without angina pectoris:  No angina. He does not take aspirin as he is on Eliquis. Continue statin, beta blocker, ACE inhibitor. Circumflex is known to be chronically occluded. If he has refractory angina, PCI  can be considered of his LCx.     Paroxysmal atrial fibrillation:  Maintaining NSR. Continue Eliquis 2.5 mg twice a day. Check BMET again today. If creatinine remains less than 1.5, consider changing Eliquis back to 5 mg twice a day. Check CBC as well.   CKD (chronic kidney disease), unspecified stage:  Check BMET today.  Hyperlipidemia:  Continue statin.  Essential hypertension:  Better controlled.  Elevated troponin:  Elevated troponins were likely related to chronic total occlusion of the circumflex in the setting of hypertensive emergency and acute pulmonary edema. I do not think he needs a stress test at this point. We will consider this in the future if he has symptoms suggestive of angina or he has no improvement in his EF (vs proceeding to [facility]).    Snoring:  He declines arranging a sleep study.   Follow up in 3 months, CMP, CBC today.  Dorothy Spark 09/04/2015 11:11 AM    [facility] [place], [place], [place]  [address] Phone: [phone]; Fax: [phone]

## 2015-09-04 NOTE — Telephone Encounter (Signed)
-----   Message from Dorothy Spark, MD sent at 09/04/2015  4:03 PM EDT ----- His Hemoglobin is 10.8, we can restart eliquis 2.5 mg po BID, we should recheck CBC in 1 month

## 2015-09-13 ENCOUNTER — Other Ambulatory Visit: Payer: Self-pay | Admitting: Physician Assistant

## 2015-10-05 ENCOUNTER — Other Ambulatory Visit (INDEPENDENT_AMBULATORY_CARE_PROVIDER_SITE_OTHER): Payer: Medicare HMO | Admitting: *Deleted

## 2015-10-05 DIAGNOSIS — D649 Anemia, unspecified: Secondary | ICD-10-CM

## 2015-10-05 DIAGNOSIS — I480 Paroxysmal atrial fibrillation: Secondary | ICD-10-CM | POA: Diagnosis not present

## 2015-10-05 LAB — CBC WITH DIFFERENTIAL/PLATELET
Basophils Absolute: 0.1 10*3/uL (ref 0.0–0.1)
Basophils Relative: 1 % (ref 0–1)
Eosinophils Absolute: 0.1 10*3/uL (ref 0.0–0.7)
Eosinophils Relative: 2 % (ref 0–5)
HCT: 33.6 % — ABNORMAL LOW (ref 39.0–52.0)
Hemoglobin: 10.8 g/dL — ABNORMAL LOW (ref 13.0–17.0)
Lymphocytes Relative: 22 % (ref 12–46)
Lymphs Abs: 1.5 10*3/uL (ref 0.7–4.0)
MCH: 28.8 pg (ref 26.0–34.0)
MCHC: 32.1 g/dL (ref 30.0–36.0)
MCV: 89.6 fL (ref 78.0–100.0)
MPV: 10.2 fL (ref 8.6–12.4)
Monocytes Absolute: 0.6 10*3/uL (ref 0.1–1.0)
Monocytes Relative: 9 % (ref 3–12)
Neutro Abs: 4.6 10*3/uL (ref 1.7–7.7)
Neutrophils Relative %: 66 % (ref 43–77)
Platelets: 192 10*3/uL (ref 150–400)
RBC: 3.75 MIL/uL — ABNORMAL LOW (ref 4.22–5.81)
RDW: 15 % (ref 11.5–15.5)
WBC: 6.9 10*3/uL (ref 4.0–10.5)

## 2015-10-05 NOTE — Addendum Note (Signed)
Addended by: Eulis Foster on: 10/05/2015 12:49 PM   Modules accepted: Orders

## 2015-10-09 ENCOUNTER — Other Ambulatory Visit (HOSPITAL_BASED_OUTPATIENT_CLINIC_OR_DEPARTMENT_OTHER): Payer: Medicare HMO

## 2015-10-09 DIAGNOSIS — D649 Anemia, unspecified: Secondary | ICD-10-CM

## 2015-10-09 DIAGNOSIS — D508 Other iron deficiency anemias: Secondary | ICD-10-CM

## 2015-10-09 LAB — CBC WITH DIFFERENTIAL/PLATELET
BASO%: 0.9 % (ref 0.0–2.0)
Basophils Absolute: 0.1 10*3/uL (ref 0.0–0.1)
EOS%: 1.9 % (ref 0.0–7.0)
Eosinophils Absolute: 0.1 10*3/uL (ref 0.0–0.5)
HCT: 34.6 % — ABNORMAL LOW (ref 38.4–49.9)
HGB: 11.4 g/dL — ABNORMAL LOW (ref 13.0–17.1)
LYMPH%: 21.2 % (ref 14.0–49.0)
MCH: 29.3 pg (ref 27.2–33.4)
MCHC: 32.9 g/dL (ref 32.0–36.0)
MCV: 89 fL (ref 79.3–98.0)
MONO#: 0.6 10*3/uL (ref 0.1–0.9)
MONO%: 8.4 % (ref 0.0–14.0)
NEUT#: 4.6 10*3/uL (ref 1.5–6.5)
NEUT%: 67.6 % (ref 39.0–75.0)
PLATELETS: 179 10*3/uL (ref 140–400)
RBC: 3.89 10*6/uL — AB (ref 4.20–5.82)
RDW: 15.7 % — AB (ref 11.0–14.6)
WBC: 6.8 10*3/uL (ref 4.0–10.3)
lymph#: 1.4 10*3/uL (ref 0.9–3.3)

## 2015-10-09 LAB — COMPREHENSIVE METABOLIC PANEL (CC13)
ALT: 13 U/L (ref 0–55)
ANION GAP: 11 meq/L (ref 3–11)
AST: 18 U/L (ref 5–34)
Albumin: 3.8 g/dL (ref 3.5–5.0)
Alkaline Phosphatase: 55 U/L (ref 40–150)
BILIRUBIN TOTAL: 0.62 mg/dL (ref 0.20–1.20)
BUN: 41.2 mg/dL — ABNORMAL HIGH (ref 7.0–26.0)
CO2: 21 meq/L — AB (ref 22–29)
Calcium: 9.6 mg/dL (ref 8.4–10.4)
Chloride: 106 mEq/L (ref 98–109)
Creatinine: 2.1 mg/dL — ABNORMAL HIGH (ref 0.7–1.3)
EGFR: 29 mL/min/{1.73_m2} — AB (ref >90)
Glucose: 142 mg/dl — ABNORMAL HIGH (ref 70–140)
POTASSIUM: 4.5 meq/L (ref 3.5–5.1)
Sodium: 138 mEq/L (ref 136–145)
TOTAL PROTEIN: 7 g/dL (ref 6.4–8.3)

## 2015-10-09 LAB — IRON AND TIBC CHCC
%SAT: 41 % (ref 20–55)
IRON: 108 ug/dL (ref 42–163)
TIBC: 261 ug/dL (ref 202–409)
UIBC: 153 ug/dL (ref 117–376)

## 2015-10-09 LAB — FERRITIN CHCC: Ferritin: 450 ng/ml — ABNORMAL HIGH (ref 22–316)

## 2015-10-13 ENCOUNTER — Other Ambulatory Visit: Payer: Self-pay | Admitting: Cardiology

## 2015-10-13 MED ORDER — FUROSEMIDE 40 MG PO TABS: 40.0000 mg | ORAL_TABLET | Freq: Two times a day (BID) | ORAL | Status: DC

## 2015-10-13 NOTE — Telephone Encounter (Signed)
Follow up      Pt c/o medication issue:  1. Name of Medication: furosemide 2. How are you currently taking this medication (dosage and times per day)?  40mg  --1 tablet bid 3. Are you having a reaction (difficulty breathing--STAT)? no 4. What is your medication issue? Pt needs clarification on dosage for taking furosemide

## 2015-10-13 NOTE — Telephone Encounter (Signed)
Pt's furosemide 40 mg tablet was sent to pt's pharmacy. Confirmation received.

## 2015-10-13 NOTE — Telephone Encounter (Signed)
Pt and his spouse were calling to obtain clarification on how the pt should be taking his lasix, and to request Eliquis 2.5 mg samples.  Informed both parties that the pt should be taking lasix 40 mg po bid, and take 1 extra tablet daily for weight up 3 or more lbs.  Informed both parties that there are 5 sample packs of eliquis left at the front desk for them to pick up at their earliest convenience.  Also informed both parties that a current up-to-date med list will be attached to the pts samples, to have this list on-hand.  Both verbalized understanding, agrees with this plan, and gracious for all the assistance provided.

## 2015-10-13 NOTE — Telephone Encounter (Signed)
New message     Patient calling the office for samples of medication:   1.  What medication and dosage are you requesting samples for? eliquis 2.5mg   2.  Are you currently out of this medication?  Have 7 days of pills left

## 2015-10-13 NOTE — Telephone Encounter (Signed)
I also called pt to inform him that we did not have any samples of Eliquis and if he would like to call back later in the week to see if we have gotten any in. I advised the pt that if he has any other problems, questions or concerns to call the office. Pt verbalized understanding.

## 2015-10-14 LAB — ERYTHROPOIETIN: ERYTHROPOIETIN: 4.8 m[IU]/mL (ref 2.6–18.5)

## 2015-10-14 LAB — SPEP & IFE WITH QIG
ALBUMIN ELP: 3.9 g/dL (ref 3.8–4.8)
ALPHA-1-GLOBULIN: 0.3 g/dL (ref 0.2–0.3)
Abnormal Protein Band1: 0.3 g/dL
Alpha-2-Globulin: 0.9 g/dL (ref 0.5–0.9)
BETA 2: 0.7 g/dL — AB (ref 0.2–0.5)
BETA GLOBULIN: 0.4 g/dL (ref 0.4–0.6)
Gamma Globulin: 0.8 g/dL (ref 0.8–1.7)
IGM, SERUM: 74 mg/dL (ref 41–251)
IgA: 450 mg/dL — ABNORMAL HIGH (ref 68–379)
IgG (Immunoglobin G), Serum: 927 mg/dL (ref 650–1600)
Total Protein, Serum Electrophoresis: 7.1 g/dL (ref 6.1–8.1)

## 2015-10-16 ENCOUNTER — Telehealth: Payer: Self-pay | Admitting: Oncology

## 2015-10-16 ENCOUNTER — Ambulatory Visit (HOSPITAL_BASED_OUTPATIENT_CLINIC_OR_DEPARTMENT_OTHER): Payer: Medicare HMO | Admitting: Oncology

## 2015-10-16 VITALS — BP 108/55 | HR 77 | Resp 18 | Ht 71.0 in | Wt 212.3 lb

## 2015-10-16 DIAGNOSIS — N289 Disorder of kidney and ureter, unspecified: Secondary | ICD-10-CM

## 2015-10-16 DIAGNOSIS — D509 Iron deficiency anemia, unspecified: Secondary | ICD-10-CM

## 2015-10-16 DIAGNOSIS — D649 Anemia, unspecified: Secondary | ICD-10-CM | POA: Diagnosis not present

## 2015-10-16 DIAGNOSIS — R768 Other specified abnormal immunological findings in serum: Secondary | ICD-10-CM

## 2015-10-16 DIAGNOSIS — D472 Monoclonal gammopathy: Secondary | ICD-10-CM

## 2015-10-16 NOTE — Progress Notes (Signed)
Hematology and Oncology Follow Up Visit  [name] [name] [id number] February 09, 1930 81 y.o. 10/16/2015 2:14 PM [name] [name], MDBurnett, Michel Santee, MD   Principle Diagnosis: 81 year old gentleman with multifactorial anemia with an element of iron deficiency as well as renal insufficiency diagnosed in July 2016. He presented with [name] hemoglobin of 10.4, iron of 35 and saturation of 17%.   Current therapy: He is currently on oral iron replacement at 325 mg twice [name] day.   Interim History:  [name] [name] presents today for [name] follow-up visit. Since his last visit, he continues to be on iron supplements and have tolerated it well. He did not feel any major difference in his symptoms at this time. He does not report any worsening chest pain or shortness of breath. Has not reported any changes in his activity level. He does not report any hematochezia or melena. Does not report any hemoptysis or hematemesis.   He does not report any headaches, blurry vision, syncope or seizures. He does not report any fevers, chills, sweats. He does not report any chest pain, palpitation orthopnea or leg edema. He does not report any cough, hemoptysis or wheezing. He does not report any nausea, vomiting but does report occasional diarrhea and abdominal pain. He does not report any frequency, urgency or hesitancy. Does not report any skeletal complaints. Remaining review of systems unremarkable.   Medications: I have reviewed the patient's current medications.  Current Outpatient Prescriptions  Medication Sig Dispense Refill  . acetaminophen (TYLENOL) 500 MG tablet Take 500 mg by mouth every 6 (six) hours as needed for moderate pain.    Marland Kitchen apixaban (ELIQUIS) 2.5 MG TABS tablet Take 1 tablet (2.5 mg total) by mouth 2 (two) times daily. 60 tablet 3  . atorvastatin (LIPITOR) 40 MG tablet Take 1 tablet (40 mg total) by mouth daily at 6 PM. 90 tablet 3  . carvedilol (COREG) 6.25 MG tablet TAKE ONE TABLET BY MOUTH TWICE DAILY WITH MEALS 60  tablet 2  . Eluxadoline (VIBERZI PO) Take 1 tablet by mouth every other day.    . ferrous sulfate 325 (65 FE) MG EC tablet Take twice [name] day with orange juice. 120 tablet 3  . furosemide (LASIX) 40 MG tablet Take 1 tablet (40 mg total) by mouth 2 (two) times daily. Take one extra tablet daily for weight up 3 or more pounds 75 tablet 11  . metFORMIN (GLUCOPHAGE) 1000 MG tablet Take 1,000 mg by mouth daily with breakfast.    . multivitamin-iron-minerals-folic acid (CENTRUM) chewable tablet Chew 1 tablet by mouth daily.    . nitroGLYCERIN (NITROSTAT) 0.4 MG SL tablet Place 1 tablet (0.4 mg total) under the tongue every 5 (five) minutes x 3 doses as needed for chest pain. 25 tablet 12  . ramipril (ALTACE) 10 MG capsule Take 10 mg by mouth daily.    Marland Kitchen spironolactone (ALDACTONE) 25 MG tablet Take 0.5 tablets (12.5 mg total) by mouth daily. 30 tablet 3  . tamsulosin (FLOMAX) 0.4 MG CAPS capsule Take 0.4 mg by mouth daily as needed (BPH).    . vitamin B-12 (CYANOCOBALAMIN) 1000 MCG tablet Take 1,000 mcg by mouth daily.     No current facility-administered medications for this visit.     Allergies:  Allergies  Allergen Reactions  . Lisinopril     Past Medical History, Surgical history, Social history, and Family History were reviewed and updated.   Physical Exam: Blood pressure 108/55, pulse 77, resp. rate 18, height 5\' 11"  (1.803 m), weight 212  lb 4.8 oz (96.299 kg), SpO2 97 %. ECOG: 1 General appearance: alert and cooperative Head: Normocephalic, without obvious abnormality Neck: no adenopathy Lymph nodes: Cervical, supraclavicular, and axillary nodes normal. Heart:regular rate and rhythm, S1, S2 normal, no murmur, click, rub or gallop Lung:chest clear, no wheezing, rales, normal symmetric air entry Abdomin: soft, non-tender, without masses or organomegaly EXT:no erythema, induration, or nodules   Lab Results: Lab Results  Component Value Date   WBC 6.8 10/09/2015   HGB 11.4*  10/09/2015   HCT 34.6* 10/09/2015   MCV 89.0 10/09/2015   PLT 179 10/09/2015     Chemistry      Component Value Date/Time   NA 138 10/09/2015 1100   NA 141 09/04/2015 1146   K 4.5 10/09/2015 1100   K 4.2 09/04/2015 1146   CL 104 09/04/2015 1146   CO2 21* 10/09/2015 1100   CO2 28 09/04/2015 1146   BUN 41.2* 10/09/2015 1100   BUN 30* 09/04/2015 1146   CREATININE 2.1* 10/09/2015 1100   CREATININE 1.57* 09/04/2015 1146      Component Value Date/Time   CALCIUM 9.6 10/09/2015 1100   CALCIUM 9.2 09/04/2015 1146   ALKPHOS 55 10/09/2015 1100   ALKPHOS 51 09/04/2015 1146   AST 18 10/09/2015 1100   AST 15 09/04/2015 1146   ALT 13 10/09/2015 1100   ALT 9 09/04/2015 1146   BILITOT 0.62 10/09/2015 1100   BILITOT 0.6 09/04/2015 1146      Results for [name] [name] (MRN [id number]) as of 10/16/2015 13:30  Ref. Range 10/09/2015 10:59  Iron Latest Ref Range: 42-163 ug/dL 108  UIBC Latest Ref Range: 117-376 ug/dL 153  TIBC Latest Ref Range: 202-409 ug/dL 261  %SAT Latest Ref Range: 20-55 % 41  Ferritin Latest Ref Range: 22-316 ng/ml 450 (H)   Results for [name] [name] (MRN [id number]) as of 10/16/2015 13:30  Ref. Range 10/09/2015 11:00  IgG (Immunoglobin G), Serum Latest Ref Range: [phone] mg/dL 927  IgA Latest Ref Range: 68-379 mg/dL 450 (H)  IgM, Serum Latest Ref Range: 41-251 mg/dL 74  Total Protein, Serum Electrophoresis Latest Ref Range: 6.1-8.1 g/dL 7.1    Impression and Plan:   81 year old gentleman with the following issues:  1. Multifactorial anemia with element of iron deficiency as well as renal insufficiency. His hemoglobin improved to 11.4 after iron supplements. He is completely asymptomatic at this time and I recommended continuing iron at once [name] day dosing moving forward. Growth factor support in the form of Aranesp or Procrit can be used in the future if his hemoglobin drops further.  Plasma cell disorder is less likely the cause of his anemia.  2. Elevated IgA  monoclonal protein is very mild elevation and likely represents MGUS rather than active myeloma. The plan is to repeat serum protein electrophoresis in 6 months.  3. Renal insufficiency: Likely related to long-standing hypertension and diabetes rather than [name] plasma cell disorder.  4. Follow-up: Will be in 6 months to repeat laboratory testing and further recommendations at that time.    [facility], MD 11/11/20162:14 PM

## 2015-10-16 NOTE — Telephone Encounter (Signed)
GAVE AVS REPORT AND APPOINTMENTS FOR MAY 2017.

## 2015-11-09 ENCOUNTER — Other Ambulatory Visit: Payer: Self-pay | Admitting: Physician Assistant

## 2015-11-18 ENCOUNTER — Telehealth: Payer: Self-pay | Admitting: Cardiology

## 2015-11-18 NOTE — Telephone Encounter (Signed)
New message     Patient calling the office for samples of medication:   1.  What medication and dosage are you requesting samples for? eliquis  2.5 mg   2.  Are you currently out of this medication? Yes

## 2015-11-18 NOTE — Telephone Encounter (Signed)
Returning patient's call about samples. Samples were placed at the front desk.

## 2015-12-10 ENCOUNTER — Ambulatory Visit (INDEPENDENT_AMBULATORY_CARE_PROVIDER_SITE_OTHER): Payer: PPO | Admitting: Cardiology

## 2015-12-10 ENCOUNTER — Encounter: Payer: Self-pay | Admitting: Cardiology

## 2015-12-10 VITALS — BP 134/60 | HR 72 | Ht 71.0 in | Wt 217.1 lb

## 2015-12-10 DIAGNOSIS — I5022 Chronic systolic (congestive) heart failure: Secondary | ICD-10-CM

## 2015-12-10 DIAGNOSIS — I5042 Chronic combined systolic (congestive) and diastolic (congestive) heart failure: Secondary | ICD-10-CM | POA: Diagnosis not present

## 2015-12-10 DIAGNOSIS — R040 Epistaxis: Secondary | ICD-10-CM

## 2015-12-10 DIAGNOSIS — I10 Essential (primary) hypertension: Secondary | ICD-10-CM | POA: Diagnosis not present

## 2015-12-10 DIAGNOSIS — I209 Angina pectoris, unspecified: Secondary | ICD-10-CM

## 2015-12-10 DIAGNOSIS — I2583 Coronary atherosclerosis due to lipid rich plaque: Secondary | ICD-10-CM

## 2015-12-10 DIAGNOSIS — E785 Hyperlipidemia, unspecified: Secondary | ICD-10-CM | POA: Diagnosis not present

## 2015-12-10 DIAGNOSIS — Z5181 Encounter for therapeutic drug level monitoring: Secondary | ICD-10-CM | POA: Insufficient documentation

## 2015-12-10 DIAGNOSIS — I2510 Atherosclerotic heart disease of native coronary artery without angina pectoris: Secondary | ICD-10-CM

## 2015-12-10 DIAGNOSIS — Z7901 Long term (current) use of anticoagulants: Secondary | ICD-10-CM

## 2015-12-10 LAB — COMPREHENSIVE METABOLIC PANEL
ALT: 17 U/L (ref 9–46)
AST: 22 U/L (ref 10–35)
Albumin: 4 g/dL (ref 3.6–5.1)
Alkaline Phosphatase: 44 U/L (ref 40–115)
BUN: 28 mg/dL — ABNORMAL HIGH (ref 7–25)
CO2: 26 mmol/L (ref 20–31)
Calcium: 9.6 mg/dL (ref 8.6–10.3)
Chloride: 103 mmol/L (ref 98–110)
Creat: 1.96 mg/dL — ABNORMAL HIGH (ref 0.70–1.11)
Glucose, Bld: 139 mg/dL — ABNORMAL HIGH (ref 65–99)
Potassium: 4.5 mmol/L (ref 3.5–5.3)
Sodium: 139 mmol/L (ref 135–146)
Total Bilirubin: 0.9 mg/dL (ref 0.2–1.2)
Total Protein: 7.1 g/dL (ref 6.1–8.1)

## 2015-12-10 LAB — CBC WITH DIFFERENTIAL/PLATELET
Basophils Absolute: 0.1 10*3/uL (ref 0.0–0.1)
Basophils Relative: 1 % (ref 0–1)
Eosinophils Absolute: 0.2 10*3/uL (ref 0.0–0.7)
Eosinophils Relative: 3 % (ref 0–5)
HCT: 35.4 % — ABNORMAL LOW (ref 39.0–52.0)
Hemoglobin: 11.5 g/dL — ABNORMAL LOW (ref 13.0–17.0)
Lymphocytes Relative: 24 % (ref 12–46)
Lymphs Abs: 1.9 10*3/uL (ref 0.7–4.0)
MCH: 29.4 pg (ref 26.0–34.0)
MCHC: 32.5 g/dL (ref 30.0–36.0)
MCV: 90.5 fL (ref 78.0–100.0)
MPV: 10.8 fL (ref 8.6–12.4)
Monocytes Absolute: 0.8 10*3/uL (ref 0.1–1.0)
Monocytes Relative: 10 % (ref 3–12)
Neutro Abs: 4.9 10*3/uL (ref 1.7–7.7)
Neutrophils Relative %: 62 % (ref 43–77)
Platelets: 199 10*3/uL (ref 150–400)
RBC: 3.91 MIL/uL — ABNORMAL LOW (ref 4.22–5.81)
RDW: 15.1 % (ref 11.5–15.5)
WBC: 7.9 10*3/uL (ref 4.0–10.5)

## 2015-12-10 MED ORDER — ASPIRIN EC 81 MG PO TBEC: 81.0000 mg | DELAYED_RELEASE_TABLET | Freq: Every day | ORAL | Status: AC

## 2015-12-10 MED ORDER — ISOSORBIDE MONONITRATE ER 30 MG PO TB24: 30.0000 mg | ORAL_TABLET | Freq: Every day | ORAL | Status: DC

## 2015-12-10 MED ORDER — RANOLAZINE ER 1000 MG PO TB12: 1000.0000 mg | ORAL_TABLET | Freq: Two times a day (BID) | ORAL | Status: DC

## 2015-12-10 NOTE — Progress Notes (Signed)
Patient ID: [name] [name], male   DOB: Aug 08, 1930, 82 y.o.   MRN: [id number]     Cardiology Office Note   Date:  12/10/2015   ID:  [name] [name], DOB [birth year], MRN [id number]  PCP:  Stephens Shire, MD  Cardiologist:  Dr. Ena Dawley     Chief Complaint  Patient presents with  . Chronic combined systolic and diastolic heart failure    f/u  . Chest Pain  . Edema   Chief complain:exertional chest pain   History of Present Illness:  [name] [name] is a 82 y.o. male with a hx of diabetes, HTN, BPH. He was admitted in December [id number] with systolic/diastolic HF in the setting of AF with RVR. Cardiac enzymes were elevated consistent with a non-STEMI. Cardiac catheterization demonstrated moderate disease in the LAD, chronic total occlusion of the mid left circumflex and OM2 branch with right to left and left to left collaterals and moderate disease in the RCA. Medical therapy was recommended. CTO PCI of the LCx could be considered if the patient had symptoms of refractory angina. He converted to NSR on diltiazem drip. EF is 40 at 45% by echocardiogram.   Admitted 04/2015 with acute on chronic respiratory failure in the setting of acute on chronic combined systolic and diastolic CHF and hypertensive emergency.  His systolic blood pressure was over 200 upon presentation. He developed acute kidney injury on top of chronic kidney disease with IV diuresis. He had troponin elevation felt to be related to respiratory failure, hypertensive emergency and acute kidney injury. It was felt that outpatient stress testing could be considered. Repeat echo demonstrated worsening LV function with EF 30%. This was reviewed by cardiology in the hospital and felt to be likely depressed from hypertensive crisis. CHF medications were adjusted. It was also recommended to consider an outpatient sleep test to rule out sleep apnea. Of note, his Eliquis was reduced to 2.5 mg twice a day due to worsening renal function and age  greater than 14.  Seen in FU 04/29/15.  I did not think that he needed to undergo stress testing at that time.  My plan is to repeat an Echo in August to assess for recovery of LVF.  If EF does not recover, consider further testing.  His Eliquis was d/e-ed in 08/2015 for anemia, Iron was started.  12/10/2015 -  The patient is coming after 3 months, he has noticed worsening exertional chest pain, occuring daily, resolves at rest. His crea was found to be elevated in Nov and metformin was discontinued, since then ihs GI issues have all resolved. He has been having frequent nose bleeds so he is only taking eliquis in the mornings. He was found to be anemic in Sep and his eliquis was stopped then restarted again. He denies orthopnea, PND, LE edema.   Studies/Reports Reviewed Today:  TTE: 07/30/2015 - Left ventricle: The cavity size was normal. Wall thickness was increased in a pattern of mild LVH. Systolic function was normal. The estimated ejection fraction was in the range of 50% to 55%. There is hypokinesis of the basalinferior myocardium. Doppler parameters are consistent with abnormal left ventricular relaxation (grade 1 diastolic dysfunction). - Aortic valve: There was trivial regurgitation. - Aortic root: The aortic root was mildly dilated. - Mitral valve: Calcified annulus. There was mild regurgitation. - Left atrium: The atrium was severely dilated.  Impressions: - Hypokinesis of the basal inferior wall with overall low normal LV function; grade 1 diastolic dysfunction; trace AI; mild MR; severe  LAE; trace TR.  Echo 04/25/15 - Mild LVH. EF 30%. Severeglobal HK. Grade 1 DD.  - Ventricular Septal motion withdyssynergy consistent with intraventricularconduction delay. - Trivial AI - Mild aortic root dilatation.   42 mm(ED). - Ascending aorta 3.8 cm in maximal diameter. - MAC, mild MR - severe LAE. Volume/bsa, S: 46.57ml/m^2. - mild RAE - Mild TR, Trivial  PI. Impressions:- When compared to the report dated 03/05/15, LVEF has significantlydeclined.  Echo 03/05/15 - EF 40% to 45%. There is hypokinesis of the inferiormyocardium. Grade 2 diastolic dysfunction). - Aortic valve: There was mild regurgitation. - Mitral valve: Calcified annulus. There was moderateregurgitation. - Left atrium: The atrium was moderately dilated. - Right ventricle: The cavity size was mildly dilated. Wallthickness was normal. - PA peak pressure: 41 mm Hg (S). Impressions: Compared to the prior study, there has been no significantinterval change.  LHC 11/24/14 LM: Patent LAD: Patent LCx: Occluded with right to left collaterals RCA: Mid moderate diffuse disease PCI: Unsuccessful attempt at PCI of the LCx-chronic total occlusion   Past Medical History  Diagnosis Date  . HLD (hyperlipidemia)   . NSTEMI (non-ST elevated myocardial infarction) ([place])   . Type II diabetes mellitus ([place])   . Paroxysmal atrial fibrillation ([facility])     a. diagnosed on 11/2014 admission. Spontaneously converted to NSR. Placed on Eliquis  . Chronic combined systolic and diastolic CHF (congestive heart failure) ([place])     a. 2d ECHO 11/24/14 with EF 40-45%, akinesis of the inferior, inferolateral and inferoseptal walls; overall mild to moderate reduction in LV function; grade 2 diastolic dysfunction; severe LAE; trace AI; moderate MR; mild TR with severely elevated pulmonary pressures. b. EF 25% in 04/2015, possibly due to HTN crisis.;  c. Echo 8/16:  Mild LVH, EF 50-55%  . CAD (coronary artery disease)     a. s/p LHC on 11/24/14 w/ mild-mod dz in LAD. CTO of mLCx and OM2 with R-->L and L-->L collaterals. b. Elevated trop 04/2015 felt due to demand ischemia.  . Pulmonary edema   . Renal insufficiency     a. Elev Cr 04/2015 requiring reduction in Eliquis.  . Obesity   . Hypertension   . History of echocardiogram     Echo 8/16:  Mild LVH, EF 50-55%, inf HK, Gr 1 DD, trivial AI, mild dilated  aortic root, MAC, mild MR, severe LAE    Past Surgical History  Procedure Laterality Date  . Appendectomy    . Total knee arthroplasty Left 2013  . Tonsillectomy    . Joint replacement    . Cardiac catheterization  11/24/2014  . Left heart catheterization with coronary angiogram N/A 11/24/2014    Procedure: LEFT HEART CATHETERIZATION WITH CORONARY ANGIOGRAM;  Surgeon: Jettie Booze, MD;  Location: [facility] CATH LAB;  Service: Cardiovascular;  Laterality: N/A;  . Esophagogastroduodenoscopy (egd) with propofol N/A 07/16/2015    Procedure: ESOPHAGOGASTRODUODENOSCOPY (EGD) WITH PROPOFOL;  Surgeon: Carol Ada, MD;  Location: [facility] ENDOSCOPY;  Service: Endoscopy;  Laterality: N/A;  . Colonoscopy with propofol N/A 07/16/2015    Procedure: COLONOSCOPY WITH PROPOFOL;  Surgeon: Carol Ada, MD;  Location: [facility] ENDOSCOPY;  Service: Endoscopy;  Laterality: N/A;     Current Outpatient Prescriptions  Medication Sig Dispense Refill  . acetaminophen (TYLENOL) 500 MG tablet Take 500 mg by mouth every 6 (six) hours as needed for moderate pain.    Marland Kitchen atorvastatin (LIPITOR) 40 MG tablet Take 1 tablet (40 mg total) by mouth daily at 6 PM. 90 tablet 3  .  carvedilol (COREG) 6.25 MG tablet TAKE ONE TABLET BY MOUTH TWICE DAILY WITH MEALS 60 tablet 2  . Eluxadoline (VIBERZI PO) Take 1 tablet by mouth every other day.    . ferrous sulfate 325 (65 FE) MG EC tablet Take twice a day with orange juice. 120 tablet 3  . furosemide (LASIX) 40 MG tablet Take 1 tablet (40 mg total) by mouth 2 (two) times daily. Take one extra tablet daily for weight up 3 or more pounds 75 tablet 11  . multivitamin-iron-minerals-folic acid (CENTRUM) chewable tablet Chew 1 tablet by mouth daily.    . nitroGLYCERIN (NITROSTAT) 0.4 MG SL tablet Place 1 tablet (0.4 mg total) under the tongue every 5 (five) minutes x 3 doses as needed for chest pain. 25 tablet 12  . ramipril (ALTACE) 10 MG capsule Take 10 mg by mouth daily.    Marland Kitchen spironolactone  (ALDACTONE) 25 MG tablet TAKE ONE-HALF TABLET BY MOUTH ONCE DAILY 30 tablet 1  . tamsulosin (FLOMAX) 0.4 MG CAPS capsule Take 0.4 mg by mouth daily as needed (BPH).    . vitamin B-12 (CYANOCOBALAMIN) 1000 MCG tablet Take 1,000 mcg by mouth daily.    Marland Kitchen aspirin EC 81 MG tablet Take 1 tablet (81 mg total) by mouth daily. 90 tablet 3  . isosorbide mononitrate (IMDUR) 30 MG 24 hr tablet Take 1 tablet (30 mg total) by mouth daily. 90 tablet 3  . ranolazine (RANEXA) 1000 MG SR tablet Take 1 tablet (1,000 mg total) by mouth 2 (two) times daily. 180 tablet 3   No current facility-administered medications for this visit.    Allergies:   Lisinopril    Social History:  The patient  reports that he has never smoked. He has never used smokeless tobacco. He reports that he drinks about 8.4 oz of alcohol per week. He reports that he does not use illicit drugs.   Family History:  The patient's family history includes Cancer (age of onset: 15) in his father; Heart attack in his brother; Stroke in his mother. There is no history of Hypertension.    ROS:   Please see the history of present illness.   Review of Systems  Cardiovascular: Positive for dyspnea on exertion, chest pain.  Hematologic/Lymphatic: Bruises/bleeds easily. Nose bleeds. All other systems reviewed and are negative.   PHYSICAL EXAM: VS:  BP 134/60 mmHg  Pulse 72  Ht 5\' 11"  (1.803 m)  Wt 217 lb 1.9 oz (98.485 kg)  BMI 30.30 kg/m2    Wt Readings from Last 3 Encounters:  12/10/15 217 lb 1.9 oz (98.485 kg)  10/16/15 212 lb 4.8 oz (96.299 kg)  09/04/15 211 lb (95.709 kg)    GEN: Well nourished, well developed, in no acute distress HEENT: normal Neck: no JVD,  no masses Cardiac:  Normal S1/S2, RRR; no murmur ,  no rubs or gallops, trace bilateral LE edema  Respiratory:  clear to auscultation bilaterally, no wheezing, rhonchi or rales. GI: soft, nontender, nondistended, + BS MS: no deformity or atrophy Skin: warm and dry  Neuro:   CNs II-XII intact, Strength and sensation are intact Psych: Normal affect  EKG:  EKG is ordered today.  It demonstrates:   NSR, HR 73, LAD, inf-lat TWI, PAC, no changes  Recent Labs: 04/23/2015: B Natriuretic Peptide 1076.3* 04/29/2015: Pro B Natriuretic peptide (BNP) 113.0* 10/09/2015: ALT 13; BUN 41.2*; Creatinine 2.1*; HGB 11.4*; Platelets 179; Potassium 4.5; Sodium 138    Lipid Panel    Component Value Date/Time  CHOL 173 11/23/2014 0903   TRIG 182* 11/23/2014 0903   HDL 48 11/23/2014 0903   CHOLHDL 3.6 11/23/2014 0903   VLDL 36 11/23/2014 0903   LDLCALC 89 11/23/2014 0903      ASSESSMENT AND PLAN:  1. CAD with angina, he has known chronic occlusion of his LCX artery with unsuccessful attempt to open. There are right to left collaterals.  We will initiate imdur 30 mg po daily, give ranolazine 100 mg po bid samples and evaluate in 3 weeks.  He is a poor cath candidate considering his CKD stage 4 and and CTO of the LCX. We might reconsult cath if continues to have chest pain and crea improves. Today's ECG is unchanged from 06/10/2015.  2. Chronic combined systolic and diastolic heart failure:   Doing well.  He is overall NYHA 2b.  His most recent echo on 07/30/15 showed improved LVEF 30-->50%.  3. Anemia - Since he is only taking eliquis once daily sec to recurrent bleeding, I will stop and start asa 81 mg po daily.   4. Paroxysmal atrial fibrillation:  Maintaining NSR.d/c eliquis as above  5. CKD (chronic kidney disease), stage 4, recheck today.  6. Hyperlipidemia:  Continue statin.  7. Essential hypertension:  Better controlled.  Follow up in 3 weeks CMP, CBC, lipids today.  Dorothy Spark 12/10/2015 1:08 PM    [facility] [address], [place], [place]  [address] Phone: [phone]; Fax: [phone]

## 2015-12-10 NOTE — Patient Instructions (Signed)
Medication Instructions:   STOP TAKING ELIQUIS NOW  START TAKING ASPIRIN 81 MG ONCE DAILY  START TAKING IMDUR 30 MG ONCE DAILY  START TAKING RANEXA 1000 MG TWICE DAILY   Labwork:  TODAY--CMET AND CBC W DIFF   Follow-Up:  3 WEEKS WITH AN EXTENDER IN OUR OFFICE      If you need a refill on your cardiac medications before your next appointment, please call your pharmacy.

## 2015-12-17 ENCOUNTER — Ambulatory Visit (INDEPENDENT_AMBULATORY_CARE_PROVIDER_SITE_OTHER): Payer: PPO | Admitting: Emergency Medicine

## 2015-12-17 ENCOUNTER — Telehealth: Payer: Self-pay | Admitting: Cardiology

## 2015-12-17 ENCOUNTER — Emergency Department (HOSPITAL_COMMUNITY)
Admission: EM | Admit: 2015-12-17 | Discharge: 2015-12-18 | Disposition: A | Discharge status: Home / Self Care | Payer: PPO | Attending: Emergency Medicine | Admitting: Emergency Medicine

## 2015-12-17 ENCOUNTER — Encounter (HOSPITAL_COMMUNITY): Payer: Self-pay | Admitting: Emergency Medicine

## 2015-12-17 VITALS — BP 90/56 | HR 74 | Temp 97.5°F | Resp 18 | Ht 67.4 in | Wt 214.0 lb

## 2015-12-17 DIAGNOSIS — E119 Type 2 diabetes mellitus without complications: Secondary | ICD-10-CM

## 2015-12-17 DIAGNOSIS — N189 Chronic kidney disease, unspecified: Secondary | ICD-10-CM

## 2015-12-17 DIAGNOSIS — I252 Old myocardial infarction: Secondary | ICD-10-CM | POA: Insufficient documentation

## 2015-12-17 DIAGNOSIS — E785 Hyperlipidemia, unspecified: Secondary | ICD-10-CM | POA: Insufficient documentation

## 2015-12-17 DIAGNOSIS — I2510 Atherosclerotic heart disease of native coronary artery without angina pectoris: Secondary | ICD-10-CM | POA: Diagnosis not present

## 2015-12-17 DIAGNOSIS — Z79899 Other long term (current) drug therapy: Secondary | ICD-10-CM | POA: Insufficient documentation

## 2015-12-17 DIAGNOSIS — Z9889 Other specified postprocedural states: Secondary | ICD-10-CM | POA: Insufficient documentation

## 2015-12-17 DIAGNOSIS — I9589 Other hypotension: Secondary | ICD-10-CM | POA: Insufficient documentation

## 2015-12-17 DIAGNOSIS — I952 Hypotension due to drugs: Secondary | ICD-10-CM | POA: Diagnosis not present

## 2015-12-17 DIAGNOSIS — I5023 Acute on chronic systolic (congestive) heart failure: Secondary | ICD-10-CM | POA: Diagnosis not present

## 2015-12-17 DIAGNOSIS — I480 Paroxysmal atrial fibrillation: Secondary | ICD-10-CM | POA: Diagnosis not present

## 2015-12-17 DIAGNOSIS — I10 Essential (primary) hypertension: Secondary | ICD-10-CM | POA: Insufficient documentation

## 2015-12-17 DIAGNOSIS — R531 Weakness: Secondary | ICD-10-CM | POA: Diagnosis not present

## 2015-12-17 DIAGNOSIS — I959 Hypotension, unspecified: Secondary | ICD-10-CM

## 2015-12-17 DIAGNOSIS — E669 Obesity, unspecified: Secondary | ICD-10-CM | POA: Diagnosis not present

## 2015-12-17 DIAGNOSIS — Z7982 Long term (current) use of aspirin: Secondary | ICD-10-CM | POA: Diagnosis not present

## 2015-12-17 DIAGNOSIS — R42 Dizziness and giddiness: Secondary | ICD-10-CM | POA: Diagnosis not present

## 2015-12-17 DIAGNOSIS — I5042 Chronic combined systolic (congestive) and diastolic (congestive) heart failure: Secondary | ICD-10-CM | POA: Insufficient documentation

## 2015-12-17 DIAGNOSIS — N179 Acute kidney failure, unspecified: Secondary | ICD-10-CM | POA: Diagnosis not present

## 2015-12-17 LAB — CBC
HEMATOCRIT: 35.7 % — AB (ref 39.0–52.0)
Hemoglobin: 11.7 g/dL — ABNORMAL LOW (ref 13.0–17.0)
MCH: 30.7 pg (ref 26.0–34.0)
MCHC: 32.8 g/dL (ref 30.0–36.0)
MCV: 93.7 fL (ref 78.0–100.0)
PLATELETS: 205 10*3/uL (ref 150–400)
RBC: 3.81 MIL/uL — ABNORMAL LOW (ref 4.22–5.81)
RDW: 15 % (ref 11.5–15.5)
WBC: 7.4 10*3/uL (ref 4.0–10.5)

## 2015-12-17 LAB — I-STAT TROPONIN, ED: Troponin i, poc: 0.06 ng/mL (ref 0.00–0.08)

## 2015-12-17 LAB — BASIC METABOLIC PANEL
Anion gap: 11 (ref 5–15)
BUN: 38 mg/dL — AB (ref 6–20)
CHLORIDE: 103 mmol/L (ref 101–111)
CO2: 27 mmol/L (ref 22–32)
CREATININE: 2.88 mg/dL — AB (ref 0.61–1.24)
Calcium: 9.9 mg/dL (ref 8.9–10.3)
GFR calc Af Amer: 22 mL/min — ABNORMAL LOW (ref >60)
GFR calc non Af Amer: 19 mL/min — ABNORMAL LOW (ref >60)
Glucose, Bld: 124 mg/dL — ABNORMAL HIGH (ref 65–99)
POTASSIUM: 5.2 mmol/L — AB (ref 3.5–5.1)
Sodium: 141 mmol/L (ref 135–145)

## 2015-12-17 MED ORDER — SODIUM CHLORIDE 0.9 % IV BOLUS (SEPSIS)
1000.0000 mL | Freq: Once | INTRAVENOUS | Status: AC
  Administered 2015-12-18: 1000 mL via INTRAVENOUS

## 2015-12-17 NOTE — Telephone Encounter (Signed)
New message      Pt was seen last thurs.  Yesterday, pt started getting dizzy.  He started 2 new medications (imdur and ranexa).  Could one of these medications be causing the dizziness?

## 2015-12-17 NOTE — ED Provider Notes (Signed)
CSN: [id number]     Arrival date & time 12/17/15  1832 History  By signing my name below, I, Arianna Nassar, attest that this documentation has been prepared under the direction and in the presence of Merryl Hacker, MD. Electronically Signed: Julien Nordmann, ED Scribe. 12/18/2015. 1:28 AM.    Chief Complaint  Patient presents with  . Weakness     The history is provided by the patient. No language interpreter was used.   HPI Comments: [name] [name] is a 82 y.o. male who has a hx of HLD, NSTEMI, DMII, paroxysmal afib, CAD, pulmonary edema, and HTN presents to the Emergency Department complaining of constant, moderate, gradual worsening weakness with associated light headedness onset 3 days ago. Pt was recently put on a new medication Imdur for 3 days and states his symptoms started shortly after. He contacted his cardiologist today and was told to stop taking the medication. He denies loss of appetite, fever, cough, congestion, shortness of breath, visual changes, headache, and chest pain.  Past Medical History  Diagnosis Date  . HLD (hyperlipidemia)   . NSTEMI (non-ST elevated myocardial infarction) ([place])   . Type II diabetes mellitus ([place])   . Paroxysmal atrial fibrillation (HCC)     a. diagnosed on 11/2014 admission. Spontaneously converted to NSR. Placed on Eliquis  . Chronic combined systolic and diastolic CHF (congestive heart failure) ([place])     a. 2d ECHO 11/24/14 with EF 40-45%, akinesis of the inferior, inferolateral and inferoseptal walls; overall mild to moderate reduction in LV function; grade 2 diastolic dysfunction; severe LAE; trace AI; moderate MR; mild TR with severely elevated pulmonary pressures. b. EF 25% in 04/2015, possibly due to HTN crisis.;  c. Echo 8/16:  Mild LVH, EF 50-55%  . CAD (coronary artery disease)     a. s/p LHC on 11/24/14 w/ mild-mod dz in LAD. CTO of mLCx and OM2 with R-->L and L-->L collaterals. b. Elevated trop 04/2015 felt due to demand ischemia.  .  Pulmonary edema   . Renal insufficiency     a. Elev Cr 04/2015 requiring reduction in Eliquis.  . Obesity   . Hypertension   . History of echocardiogram     Echo 8/16:  Mild LVH, EF 50-55%, inf HK, Gr 1 DD, trivial AI, mild dilated aortic root, MAC, mild MR, severe LAE   Past Surgical History  Procedure Laterality Date  . Appendectomy    . Total knee arthroplasty Left 2013  . Tonsillectomy    . Joint replacement    . Cardiac catheterization  11/24/2014  . Left heart catheterization with coronary angiogram N/A 11/24/2014    Procedure: LEFT HEART CATHETERIZATION WITH CORONARY ANGIOGRAM;  Surgeon: Jettie Booze, MD;  Location: [facility] CATH LAB;  Service: Cardiovascular;  Laterality: N/A;  . Esophagogastroduodenoscopy (egd) with propofol N/A 07/16/2015    Procedure: ESOPHAGOGASTRODUODENOSCOPY (EGD) WITH PROPOFOL;  Surgeon: Carol Ada, MD;  Location: [facility] ENDOSCOPY;  Service: Endoscopy;  Laterality: N/A;  . Colonoscopy with propofol N/A 07/16/2015    Procedure: COLONOSCOPY WITH PROPOFOL;  Surgeon: Carol Ada, MD;  Location: [facility] ENDOSCOPY;  Service: Endoscopy;  Laterality: N/A;   Family History  Problem Relation Age of Onset  . Cancer Father 66    Deceased  . Stroke Mother     Deceased  . Heart attack Brother     Deceased. Had cath with stents followed by PE  . Hypertension Neg Hx   . Heart disease Brother   . Heart disease Sister   .  Heart disease Sister    Social History  Substance Use Topics  . Smoking status: Never Smoker   . Smokeless tobacco: Never Used  . Alcohol Use: 8.4 oz/week    7 Shots of liquor, 7 Standard drinks or equivalent per week     Comment: Daily    Review of Systems  Constitutional: Negative for fever and appetite change.  HENT: Negative for congestion.   Eyes: Negative for visual disturbance.  Respiratory: Negative for shortness of breath.   Cardiovascular: Negative for chest pain.  Neurological: Positive for weakness and light-headedness. Negative  for headaches.  All other systems reviewed and are negative.     Allergies  Lisinopril  Home Medications   Prior to Admission medications   Medication Sig Start Date End Date Taking? Authorizing Provider  acetaminophen (TYLENOL) 500 MG tablet Take 500 mg by mouth every 6 (six) hours as needed for moderate pain.   Yes Historical Provider, MD  aspirin EC 81 MG tablet Take 1 tablet (81 mg total) by mouth daily. 12/10/15  Yes Dorothy Spark, MD  atorvastatin (LIPITOR) 40 MG tablet Take 1 tablet (40 mg total) by mouth daily at 6 PM. 04/29/15  Yes Scott T Kathlen Mody, PA-C  carvedilol (COREG) 6.25 MG tablet TAKE ONE TABLET BY MOUTH TWICE DAILY WITH MEALS 09/14/15  Yes Dorothy Spark, MD  Eluxadoline (VIBERZI) 75 MG TABS Take 75 mg by mouth daily.   Yes Historical Provider, MD  ferrous sulfate 325 (65 FE) MG EC tablet Take twice a day with orange juice. 08/18/15  Yes Wyatt Portela, MD  furosemide (LASIX) 40 MG tablet Take 1 tablet (40 mg total) by mouth 2 (two) times daily. Take one extra tablet daily for weight up 3 or more pounds 10/13/15  Yes Dorothy Spark, MD  multivitamin-iron-minerals-folic acid (CENTRUM) chewable tablet Chew 1 tablet by mouth daily.   Yes Historical Provider, MD  nitroGLYCERIN (NITROSTAT) 0.4 MG SL tablet Place 1 tablet (0.4 mg total) under the tongue every 5 (five) minutes x 3 doses as needed for chest pain. 11/26/14  Yes Eileen Stanford, PA-C  ramipril (ALTACE) 10 MG capsule Take 10 mg by mouth daily.   Yes Historical Provider, MD  ranolazine (RANEXA) 1000 MG SR tablet Take 1 tablet (1,000 mg total) by mouth 2 (two) times daily. 12/10/15  Yes Dorothy Spark, MD  spironolactone (ALDACTONE) 25 MG tablet TAKE ONE-HALF TABLET BY MOUTH ONCE DAILY 11/11/15  Yes Dorothy Spark, MD  tamsulosin (FLOMAX) 0.4 MG CAPS capsule Take 0.4 mg by mouth daily as needed (BPH).   Yes Historical Provider, MD  vitamin B-12 (CYANOCOBALAMIN) 1000 MCG tablet Take 1,000 mcg by mouth daily.    Yes Historical Provider, MD   Triage vitals: BP 109/58 mmHg  Pulse 65  Temp(Src) 97.4 F (36.3 C) (Oral)  Resp 18  Ht 5\' 6"  (1.676 m)  Wt 214 lb (97.07 kg)  BMI 34.56 kg/m2  SpO2 100% Physical Exam  Constitutional: He is oriented to person, place, and time. He appears well-developed and well-nourished.  Elderly, no acute distress  HENT:  Head: Normocephalic and atraumatic.  Eyes: Pupils are equal, round, and reactive to light.  Cardiovascular: Normal rate, regular rhythm and normal heart sounds.   No murmur heard. Pulmonary/Chest: Effort normal and breath sounds normal. No respiratory distress. He has no wheezes.  Abdominal: Soft. Bowel sounds are normal. There is no tenderness. There is no rebound.  Musculoskeletal: He exhibits no edema.  Neurological: He  is alert and oriented to person, place, and time.  cranial nerves II through XII intact, 5 out of 5 strength in all 4 extremities, no dysmetria to finger-nose-finger, normal gait  Skin: Skin is warm and dry.  Psychiatric: He has a normal mood and affect.  Nursing note and vitals reviewed.   ED Course  Procedures  DIAGNOSTIC STUDIES: Oxygen Saturation is 100% on RA, normal by my interpretation.  COORDINATION OF CARE:  11:37 PM Discussed treatment plan with pt at bedside and pt agreed to plan.  Labs Review Labs Reviewed  BASIC METABOLIC PANEL - Abnormal; Notable for the following:    Potassium 5.2 (*)    Glucose, Bld 124 (*)    BUN 38 (*)    Creatinine, Ser 2.88 (*)    GFR calc non Af Amer 19 (*)    GFR calc Af Amer 22 (*)    All other components within normal limits  CBC - Abnormal; Notable for the following:    RBC 3.81 (*)    Hemoglobin 11.7 (*)    HCT 35.7 (*)    All other components within normal limits  I-STAT TROPOININ, ED    Imaging Review No results found. I have personally reviewed and evaluated these images and lab results as part of my medical decision-making.   EKG Interpretation ED ECG  REPORT   Date: 12/18/2015  Rate: 69  Rhythm: normal sinus rhythm  QRS Axis: normal  Intervals: normal  ST/T Wave abnormalities: nonspecific T wave changes  Conduction Disutrbances:nonspecific intraventricular conduction delay  Narrative Interpretation:   Old EKG Reviewed: changes noted  I have personally reviewed the EKG tracing and disagree with the computerized printout as noted.   Nonspecific intraventricular conduction delay. T-wave inversions inferiorly, new.       MDM   Final diagnoses:  Transient hypotension  Acute kidney injury ([place])    A short presents with dizziness, lightheadedness after starting Imdur 2 or 3 days ago. Noted to be hypotensive at urgent care. [place] discussed with his cardiologist who is recommended discontinuing Imdur.  Nontoxic and nonfocal on my exam. Blood pressure here improved without intervention. Patient was given fluids. He is nonfocal. Not orthostatic. Does have evidenc eof worsening kidney function. Baseline creatinine of . Current creatinine 2.88. This could reflect mild volume depletion. Potassium is 5.2 but without EKG changes.  Patient feels much better after fluids. He ambulated without difficulty.  Patient's EKG is without arrhythmia. He does have new T-wave inversions. No chest pain during this time.Troponin is negative.e wishes to be discharged. Discussed with patient follow up closely with cardiology. He also needs to follow-up with his primary physician for recheck of his renal function.    After history, exam, and medical workup I feel the patient has been appropriately medically screened and is safe for discharge home. Pertinent diagnoses were discussed with the patient. Patient was given return precautions.  I personally performed the services described in this documentation, which was scribed in my presence. The recorded information has been reviewed and is accurate.   Merryl Hacker, MD 12/18/15 [phone]

## 2015-12-17 NOTE — ED Notes (Signed)
Pt was sent from urgent care for low bp and weakness. Pt states hes been feeling weak since yesterday and lightheaded. On arrival to ED bp is 130/67. Pt is alert and ox4.

## 2015-12-17 NOTE — Telephone Encounter (Signed)
Notified the [name] [name] and the wife to inform him that he should still go to Urgent Care and stop imdur now, and come in for a BP check/nurse visit next Friday 12/25/15.  Both [name] [name] and wife verbalized understanding and agree with this plan.

## 2015-12-17 NOTE — Telephone Encounter (Signed)
Please discontinue imdur, continue ranexa, please check on him the next week.

## 2015-12-17 NOTE — Telephone Encounter (Signed)
Pts wife calling in to report that the pt has been complaining of dizziness and fatigue since he started taking Imdur and Ranexa.  Pts wife is stating that the pt feels dizzy, even at rest.  Pts wife states that she doesn't think he can tolerate then new med changes Dr Meda Coffee made.  Wife states that the pt has no chest pain, sob, DOE, palpitations, LEE, pre-syncopal or syncopal episodes at this time or since starting both imdur and ranexa.  Asked the wife if she has a BP cuff on-hand, and per the wife she states she does.  Asked the wife to obtain a BP in both arms.  Per the wife the pts BP in the right arm is :  74/52  Left arm: 72/50  HR remained at 70. Advised the wife that she needs to call 911 or take the pt to the ER for fluid hydration through IV therapy.  Per the wife the pt absolutely refuses to go to the ER, but they will defer to [facility] for further assistance.  Advised the wife that I really urge the pt to have immediate medical attention via 911 or drive to the ER.  Wife states they will be going to Urgent Care instead.  Advised the wife that when transferring the pt to the car and into urgent care, she should make him change positions slowly.  Advised the wife that the pt should start taking in po fluids on the way to Urgent Care.  Advised the wife to hold off of giving the pt anymore cardiac meds, especially imdur.  Advised the wife that I will route this message to Dr Meda Coffee for further review and recommendation if necessary, but she should report to urgent care now with the pt for possible fluid bolus.  Wife verbalized understanding and agrees with this plan.

## 2015-12-17 NOTE — Progress Notes (Signed)
Subjective:  Patient ID: [name] [name], male    DOB: 1930-04-23  Age: 82 y.o. MRN: [id number]  CC: Hypotension; Fatigue; and Dizziness   HPI [name] presents  he was recently placed on Imdur for hyper tension and since taking the medication is blood pressure is been low at home measured in the [id number] systolic. He's been stumbling and quite dizzy take 2 or 3 steps together without becoming lightheaded. He has not fallen the ground. He's had no chest pain tightness heaviness pressure. No shortness of breath. No wheezing. Peripheral edema. He said no headache. He has not hit his head. Not been unconscious. He corresponded by phone with his cardiologist who instructed him to calm to the emergency room and and be evaluated but he elected to come to urgent care instead. Blood pressure on arrival here was 90    History Garyson has a past medical history of HLD (hyperlipidemia); NSTEMI (non-ST elevated myocardial infarction) ([place]); Type II diabetes mellitus ([place]); Paroxysmal atrial fibrillation ([place]); Chronic combined systolic and diastolic CHF (congestive heart failure) ([place]); CAD (coronary artery disease); Pulmonary edema; Renal insufficiency; Obesity; Hypertension; and History of echocardiogram.   He has past surgical history that includes Appendectomy; Total knee arthroplasty (Left, 2013); Tonsillectomy; Joint replacement; Cardiac catheterization (11/24/2014); left heart catheterization with coronary angiogram (N/A, 11/24/2014); Esophagogastroduodenoscopy (egd) with propofol (N/A, 07/16/2015); and Colonoscopy with propofol (N/A, 07/16/2015).   His  family history includes Cancer (age of onset: 21) in his father; Heart attack in his brother; Heart disease in his brother, sister, and sister; Stroke in his mother. There is no history of Hypertension.  He   reports that he has never smoked. He has never used smokeless tobacco. He reports that he drinks about 8.4 oz of alcohol per week. He reports that  he does not use illicit drugs.  Outpatient Prescriptions Prior to Visit  Medication Sig Dispense Refill  . acetaminophen (TYLENOL) 500 MG tablet Take 500 mg by mouth every 6 (six) hours as needed for moderate pain.    Marland Kitchen aspirin EC 81 MG tablet Take 1 tablet (81 mg total) by mouth daily. 90 tablet 3  . atorvastatin (LIPITOR) 40 MG tablet Take 1 tablet (40 mg total) by mouth daily at 6 PM. 90 tablet 3  . carvedilol (COREG) 6.25 MG tablet TAKE ONE TABLET BY MOUTH TWICE DAILY WITH MEALS 60 tablet 2  . Eluxadoline (VIBERZI PO) Take 1 tablet by mouth every other day.    . ferrous sulfate 325 (65 FE) MG EC tablet Take twice a day with orange juice. 120 tablet 3  . furosemide (LASIX) 40 MG tablet Take 1 tablet (40 mg total) by mouth 2 (two) times daily. Take one extra tablet daily for weight up 3 or more pounds 75 tablet 11  . multivitamin-iron-minerals-folic acid (CENTRUM) chewable tablet Chew 1 tablet by mouth daily.    . nitroGLYCERIN (NITROSTAT) 0.4 MG SL tablet Place 1 tablet (0.4 mg total) under the tongue every 5 (five) minutes x 3 doses as needed for chest pain. 25 tablet 12  . ramipril (ALTACE) 10 MG capsule Take 10 mg by mouth daily.    . ranolazine (RANEXA) 1000 MG SR tablet Take 1 tablet (1,000 mg total) by mouth 2 (two) times daily. 180 tablet 3  . spironolactone (ALDACTONE) 25 MG tablet TAKE ONE-HALF TABLET BY MOUTH ONCE DAILY 30 tablet 1  . tamsulosin (FLOMAX) 0.4 MG CAPS capsule Take 0.4 mg by mouth daily as needed (BPH).    Marland Kitchen  vitamin B-12 (CYANOCOBALAMIN) 1000 MCG tablet Take 1,000 mcg by mouth daily.     No facility-administered medications prior to visit.    Social History   Social History  . Marital Status: Married    Spouse Name: N/A  . Number of Children: N/A  . Years of Education: N/A   Social History Main Topics  . Smoking status: Never Smoker   . Smokeless tobacco: Never Used  . Alcohol Use: 8.4 oz/week    7 Shots of liquor, 7 Standard drinks or equivalent per week       Comment: Daily  . Drug Use: No  . Sexual Activity: Not Currently   Other Topics Concern  . None   Social History Narrative     Review of Systems  Constitutional: Negative for fever, chills and appetite change.  HENT: Negative for congestion, ear pain, postnasal drip, sinus pressure and sore throat.   Eyes: Negative for pain and redness.  Respiratory: Negative for cough, shortness of breath and wheezing.   Cardiovascular: Negative for leg swelling.  Gastrointestinal: Negative for nausea, vomiting, abdominal pain, diarrhea, constipation and blood in stool.  Endocrine: Negative for polyuria.  Genitourinary: Negative for dysuria, urgency, frequency and flank pain.  Musculoskeletal: Negative for gait problem.  Skin: Negative for rash.  Neurological: Negative for weakness and headaches.  Psychiatric/Behavioral: Negative for confusion and decreased concentration. The patient is not nervous/anxious.     Objective:  BP 90/56 mmHg  Pulse 74  Temp(Src) 97.5 F (36.4 C) (Oral)  Resp 18  Ht 5' 7.4" (1.712 m)  Wt 214 lb (97.07 kg)  BMI 33.12 kg/m2  SpO2 98%  Physical Exam  Constitutional: He is oriented to person, place, and time. He appears well-developed and well-nourished. No distress.  HENT:  Head: Normocephalic and atraumatic.  Right Ear: External ear normal.  Left Ear: External ear normal.  Nose: Nose normal.  Eyes: Conjunctivae and EOM are normal. Pupils are equal, round, and reactive to light. No scleral icterus.  Neck: Normal range of motion. Neck supple. No tracheal deviation present.  Cardiovascular: Normal rate, regular rhythm and normal heart sounds.   Pulmonary/Chest: Effort normal. No respiratory distress. He has no wheezes. He has no rales.  Abdominal: He exhibits no mass. There is no tenderness. There is no rebound and no guarding.  Musculoskeletal: He exhibits no edema.  Lymphadenopathy:    He has no cervical adenopathy.  Neurological: He is alert and  oriented to person, place, and time. Coordination normal.  Skin: Skin is warm and dry. No rash noted.  Psychiatric: He has a normal mood and affect. His behavior is normal.      Assessment & Plan:   Drevion was seen today for hypotension, fatigue and dizziness.  Diagnoses and all orders for this visit:  Hypotension due to drugs -     EKG 12-Lead -     POCT CBC  Acute on chronic systolic heart failure (HCC) -     EKG 12-Lead -     POCT CBC  Diabetes mellitus with no complication (HCC) -     EKG 12-Lead -     POCT CBC  CKD (chronic kidney disease), unspecified stage -     EKG 12-Lead -     POCT CBC   I am having Mr. Corne maintain his ramipril, tamsulosin, nitroGLYCERIN, acetaminophen, atorvastatin, multivitamin-iron-minerals-folic acid, vitamin 0000000, ferrous sulfate, Eluxadoline (VIBERZI PO), carvedilol, furosemide, spironolactone, aspirin EC, and ranolazine.  No orders of the defined types were placed  in this encounter.   He was instructed to go to the emergency room initially by ambulance he refused the ambulance so his wife took him to the hospital by POV  Appropriate red flag conditions were discussed with the patient as well as actions that should be taken.  Patient expressed his understanding.  Follow-up: Return if symptoms worsen or fail to improve.  Roselee Culver, MD

## 2015-12-18 NOTE — Discharge Instructions (Signed)
You were seen today for dizziness. Your blood pressure was a little low.  This is likely related to starting him to work. You need to discontinue this medication. Your kidney function was a little elevated. You need to have this rechecked. You need to follow-up closely with her cardiologist.  Orthostatic Hypotension Orthostatic hypotension is a sudden drop in blood pressure. It happens when you quickly stand up from a seated or lying position. You may feel dizzy or light-headed. This can last for just a few seconds or for up to a few minutes. It is usually not a serious problem. However, if this happens frequently or gets worse, it can be a sign of something more serious. CAUSES  Different things can cause orthostatic hypotension, including:   Loss of body fluids (dehydration).  Medicines that lower blood pressure.  Sudden changes in posture, such as standing up quickly after you have been sitting or lying down.  Taking too much of your medicine. SIGNS AND SYMPTOMS   Light-headedness or dizziness.   Fainting or near-fainting.   A fast heart rate.   Weakness.   Feeling tired (fatigue).  DIAGNOSIS  Your health care provider may do several things to help diagnose your condition and identify the cause. These may include:   Taking a medical history and doing a physical exam.  Checking your blood pressure. Your health care provider will check your blood pressure when you are:  Lying down.  Sitting.  Standing.  Using tilt table testing. In this test, you lie down on a table that moves from a lying position to a standing position. You will be strapped onto the table. This test monitors your blood pressure and heart rate when you are in different positions. TREATMENT  Treatment will vary depending on the cause. Possible treatments include:   Changing the dosage of your medicines.  Wearing compression stockings on your lower legs.  Standing up slowly after sitting or lying  down.  Eating more salt.  Eating frequent, small meals.  In some cases, getting IV fluids.  Taking medicine to enhance fluid retention. HOME CARE INSTRUCTIONS  Only take over-the-counter or prescription medicines as directed by your health care provider.  Follow your health care provider's instructions for changing the dosage of your current medicines.  Do not stop or adjust your medicine on your own.  Stand up slowly after sitting or lying down. This allows your body to adjust to the different position.  Wear compression stockings as directed.  Eat extra salt as directed.  Do not add extra salt to your diet unless directed to by your health care provider.  Eat frequent, small meals.  Avoid standing suddenly after eating.  Avoid hot showers or excessive heat as directed by your health care provider.  Keep all follow-up appointments. SEEK MEDICAL CARE IF:  You continue to feel dizzy or light-headed after standing.  You feel groggy or confused.  You feel cold, clammy, or sick to your stomach (nauseous).  You have blurred vision.  You feel short of breath. SEEK IMMEDIATE MEDICAL CARE IF:   You faint after standing.  You have chest pain.  You have difficulty breathing.   You lose feeling or movement in your arms or legs.   You have slurred speech or difficulty talking, or you are unable to talk.  MAKE SURE YOU:   Understand these instructions.  Will watch your condition.  Will get help right away if you are not doing well or get worse.  This information is not intended to replace advice given to you by your health care provider. Make sure you discuss any questions you have with your health care provider.   Document Released: 11/11/2002 Document Revised: 11/26/2013 Document Reviewed: 09/13/2013 Elsevier Interactive Patient Education 2016 Else[employer]  Acute Kidney Injury Acute kidney injury is any condition in which there is sudden (acute) damage  to the kidneys. Acute kidney injury was previously known as acute kidney failure or acute renal failure. The kidneys are two organs that lie on either side of the spine between the middle of the back and the front of the abdomen. The kidneys:  Remove wastes and extra water from the blood.   Produce important hormones. These help keep bones strong, regulate blood pressure, and help create red blood cells.   Balance the fluids and chemicals in the blood and tissues. A small amount of kidney damage may not cause problems, but a large amount of damage may make it difficult or impossible for the kidneys to work the way they should. Acute kidney injury may develop into long-lasting (chronic) kidney disease. It may also develop into a life-threatening disease called end-stage kidney disease. Acute kidney injury can get worse very quickly, so it should be treated right away. Early treatment may prevent other kidney diseases from developing. CAUSES   A problem with blood flow to the kidneys. This may be caused by:   Blood loss.   Heart disease.   Severe burns.   Liver disease.  Direct damage to the kidneys. This may be caused by:  Some medicines.   A kidney infection.   Poisoning or consuming toxic substances.   A surgical wound.   A blow to the kidney area.   A problem with urine flow. This may be caused by:   Cancer.   Kidney stones.   An enlarged prostate. SIGNS AND SYMPTOMS   Swelling (edema) of the legs, ankles, or feet.   Tiredness (lethargy).   Nausea or vomiting.   Confusion.   Problems with urination, such as:   Painful or burning feeling during urination.   Decreased urine production.   Frequent accidents in children who are potty trained.   Bloody urine.   Muscle twitches and cramps.   Shortness of breath.   Seizures.   Chest pain or pressure. Sometimes, no symptoms are present. DIAGNOSIS Acute kidney injury may be  detected and diagnosed by tests, including blood, urine, imaging, or kidney biopsy tests.  TREATMENT Treatment of acute kidney injury varies depending on the cause and severity of the kidney damage. In mild cases, no treatment may be needed. The kidneys may heal on their own. If acute kidney injury is more severe, your health care provider will treat the cause of the kidney damage, help the kidneys heal, and prevent complications from occurring. Severe cases may require a procedure to remove toxic wastes from the body (dialysis) or surgery to repair kidney damage. Surgery may involve:   Repair of a torn kidney.   Removal of an obstruction. HOME CARE INSTRUCTIONS  Follow your prescribed diet.  Take medicines only as directed by your health care provider.  Do not take any new medicines (prescription, over-the-counter, or nutritional supplements) unless approved by your health care provider. Many medicines can worsen your kidney damage or may need to have the dose adjusted.   Keep all follow-up visits as directed by your health care provider. This is important.  Observe your condition to make sure you are  healing as expected. SEEK IMMEDIATE MEDICAL CARE IF:  You are feeling ill or have severe pain in the back or side.   Your symptoms return or you have new symptoms.  You have any symptoms of end-stage kidney disease. These include:   Persistent itchiness.   Loss of appetite.   Headaches.   Abnormally dark or light skin.  Numbness in the hands or feet.   Easy bruising.   Frequent hiccups.   Menstruation stops.   You have a fever.  You have increased urine production.  You have pain or bleeding when urinating. MAKE SURE YOU:   Understand these instructions.  Will watch your condition.  Will get help right away if you are not doing well or get worse.   This information is not intended to replace advice given to you by your health care provider. Make sure  you discuss any questions you have with your health care provider.   Document Released: 06/06/2011 Document Revised: 12/12/2014 Document Reviewed: 07/20/2012 Elsevier Interactive Patient Education [employer].

## 2015-12-25 ENCOUNTER — Ambulatory Visit (INDEPENDENT_AMBULATORY_CARE_PROVIDER_SITE_OTHER): Payer: PPO | Admitting: Cardiology

## 2015-12-25 ENCOUNTER — Encounter: Payer: PPO | Admitting: Cardiology

## 2015-12-25 VITALS — BP 92/58 | HR 78

## 2015-12-25 DIAGNOSIS — I5022 Chronic systolic (congestive) heart failure: Secondary | ICD-10-CM | POA: Diagnosis not present

## 2015-12-25 DIAGNOSIS — I480 Paroxysmal atrial fibrillation: Secondary | ICD-10-CM | POA: Diagnosis not present

## 2015-12-25 DIAGNOSIS — I952 Hypotension due to drugs: Secondary | ICD-10-CM

## 2015-12-25 MED ORDER — RAMIPRIL 2.5 MG PO CAPS: 2.5000 mg | ORAL_CAPSULE | Freq: Every day | ORAL | Status: DC

## 2015-12-25 MED ORDER — CARVEDILOL 3.125 MG PO TABS: 3.1250 mg | ORAL_TABLET | Freq: Two times a day (BID) | ORAL | Status: DC

## 2015-12-25 MED ORDER — SODIUM CHLORIDE 0.9 % IV SOLN
Freq: Once | INTRAVENOUS | Status: AC
  Administered 2015-12-25: 17:00:00 via INTRAVENOUS

## 2015-12-25 NOTE — Addendum Note (Signed)
Addended by: Candee Furbish C on: 12/25/2015 10:37 AM   Modules accepted: Level of Service

## 2015-12-25 NOTE — Progress Notes (Signed)
22 g a/c placed in right a/c by Lorenda Peck, RN after 1 unsuccessful attempt in the left arm. 1,000 ml of NS flowed in at gravity and was administered by verbal order of Dr Candee Furbish.  Pt tolerated fluid administration without difficulty.  A/C was removed in entirety and pt left the clinic without complain stating he felt much better.

## 2015-12-25 NOTE — Patient Instructions (Addendum)
Medication Instructions:   STOP TAKING LASIX NOW  STOP TAKING SPIRONOLACTONE NOW  DECREASE CARVEDILOL TO 3.125 MG TWICE DAILY  DECREASE RAMIPRIL TO 2.5 MG ONCE DAILY     Follow-Up:  WITH AN EXTENDER IN OUR OFFICE IN ONE WEEK     If you need a refill on your cardiac medications before your next appointment, please call your pharmacy.

## 2015-12-25 NOTE — Progress Notes (Signed)
  ROS  Error. Please see other note today. Candee Furbish, MD

## 2015-12-25 NOTE — Addendum Note (Signed)
Addended by: Shellia Cleverly on: 12/25/2015 04:43 PM   Modules accepted: Orders

## 2015-12-25 NOTE — Progress Notes (Addendum)
1.) Reason for visit:  BP re-check for follow-up of d/c of imdur on 1/13 for low BP and sent to ER for fluid hydration  2.) Name of MD requesting visit: Dr [name] [name]  3.) H&P: Pt was recently placed on imdur for HTN and since taking the med his BP is been low at home in the [id number] systolic.  4.) ROS related to problem: Pt called Dr [name] [name] last Friday 1/13 with complaints of feeling dizzy and light-headed.  Pt correlated his symptoms with starting new medications Imdur and Ranexa.  Had the pts wife take his BP and HR on 1/13 call and reported right arm: 74/52 and left arm: 72/50, and HR stayed at 70 bpm.  Pt was then advised by myself and Dr [name] [name] to stop Imdur immediately and refer to the ER for fluid hydration, then come back in one week on 12/25/15 for nurse visit BP check/follow-up.  Pt refused to go to the ER so he went to his PCP at Urgent Care.  PCP then saw the pt and immediately sent him to the ER, private vehicle, for IV Fluids.  Pt was treated at the ER with IV Fluid intervention and then discharged with instructions to follow-up with cardiology and his PCP for his renal function.   Pt presents today for BP check /Nurse visit follow-up for d/c of Imdur and ER visit.  Pt reports that he did stop his imdur and has not taken this medication at all, since it was discontinued on 12/18/15.  Pts V/S today were:  BP Right Arm- 94/60 and Left Arm was 92/58.  SPO2 98% RA, HR- 78 regular.  Pt states his dizziness and light-headedness has much improved since last Friday, but still has very mild bouts of this.  Pt states its still not as bad as last week when he started Imdur and Ranexa.  Pt has no complaints of chest pain, sob, DOE, palpitations, pre-syncopal or syncopal episodes.  Went and spoke with our DOD Dr Marlou Porch about pts current symptoms and readings, and per Dr Marlou Porch he advises that the pt remain off of imdur, decrease carvedilol to 3.125 mg po BID, stop lasix, decrease Ramipril to 2.5 mg once daily, and stop  spironolactone.  Dr Marlou Porch advised that the pt follow-up with an extender in our office in one week for mentioned complaints.   5.) Assessment and plan per MD: see below.     Cardiology Office Note    Date:  12/25/2015   ID:  [name] [name], DOB Apr 26, 1930, MRN [id number]  PCP:  [name] Shire, MD  Cardiologist:  Dr. Meda [name]     History of Present Illness:  [name] [name] is a 82 y.o. male here for evaluation of persistent hypotension. Has a history of chronic systolic heart failure however recent echocardiogram demonstrated ejection fraction of 50-55% on 07/30/15.  As described above, he has had difficulty with hypotension. Blood pressure as low as 70s. Went to the emergency room and received fluids. He was still however taking diuretics both Lasix as well as spironolactone.  After fluids in the emergency room, his blood pressure was improved however he came in today for a nurse blood pressure check and his blood pressures once again were in the 0000000 systolic. He is not having any ongoing dyspnea, chest pain, no syncope.    Past Medical History  Diagnosis Date  . HLD (hyperlipidemia)   . NSTEMI (non-ST elevated myocardial infarction) ([place])   . Type II diabetes mellitus ([place])   .  Paroxysmal atrial fibrillation (H[facility])     a. diagnosed on 11/2014 admission. Spontaneously converted to NSR. Placed on Eliquis  . Chronic combined systolic and diastolic CHF (congestive heart failure) ([place])     a. 2d ECHO 11/24/14 with EF 40-45%, akinesis of the inferior, inferolateral and inferoseptal walls; overall mild to moderate reduction in LV function; grade 2 diastolic dysfunction; severe LAE; trace AI; moderate MR; mild TR with severely elevated pulmonary pressures. b. EF 25% in 04/2015, possibly due to HTN crisis.;  c. Echo 8/16:  Mild LVH, EF 50-55%  . CAD (coronary artery disease)     a. s/p LHC on 11/24/14 w/ mild-mod dz in LAD. CTO of mLCx and OM2 with R-->L and L-->L collaterals. b. Elevated trop  04/2015 felt due to demand ischemia.  . Pulmonary edema   . Renal insufficiency     a. Elev Cr 04/2015 requiring reduction in Eliquis.  . Obesity   . Hypertension   . History of echocardiogram     Echo 8/16:  Mild LVH, EF 50-55%, inf HK, Gr 1 DD, trivial AI, mild dilated aortic root, MAC, mild MR, severe LAE    Past Surgical History  Procedure Laterality Date  . Appendectomy    . Total knee arthroplasty Left 2013  . Tonsillectomy    . Joint replacement    . Cardiac catheterization  11/24/2014  . Left heart catheterization with coronary angiogram N/A 11/24/2014    Procedure: LEFT HEART CATHETERIZATION WITH CORONARY ANGIOGRAM;  Surgeon: Jettie Booze, MD;  Location: [facility] CATH LAB;  Service: Cardiovascular;  Laterality: N/A;  . Esophagogastroduodenoscopy (egd) with propofol N/A 07/16/2015    Procedure: ESOPHAGOGASTRODUODENOSCOPY (EGD) WITH PROPOFOL;  Surgeon: Carol Ada, MD;  Location: [facility] ENDOSCOPY;  Service: Endoscopy;  Laterality: N/A;  . Colonoscopy with propofol N/A 07/16/2015    Procedure: COLONOSCOPY WITH PROPOFOL;  Surgeon: Carol Ada, MD;  Location: [facility] ENDOSCOPY;  Service: Endoscopy;  Laterality: N/A;    Outpatient Prescriptions Prior to Visit  Medication Sig Dispense Refill  . acetaminophen (TYLENOL) 500 MG tablet Take 500 mg by mouth every 6 (six) hours as needed for moderate pain.    Marland Kitchen aspirin EC 81 MG tablet Take 1 tablet (81 mg total) by mouth daily. 90 tablet 3  . atorvastatin (LIPITOR) 40 MG tablet Take 1 tablet (40 mg total) by mouth daily at 6 PM. 90 tablet 3  . Eluxadoline (VIBERZI) 75 MG TABS Take 75 mg by mouth daily.    . ferrous sulfate 325 (65 FE) MG EC tablet Take twice a day with orange juice. 120 tablet 3  . multivitamin-iron-minerals-folic acid (CENTRUM) chewable tablet Chew 1 tablet by mouth daily.    . nitroGLYCERIN (NITROSTAT) 0.4 MG SL tablet Place 1 tablet (0.4 mg total) under the tongue every 5 (five) minutes x 3 doses as needed for chest pain. 25  tablet 12  . ranolazine (RANEXA) 1000 MG SR tablet Take 1 tablet (1,000 mg total) by mouth 2 (two) times daily. 180 tablet 3  . tamsulosin (FLOMAX) 0.4 MG CAPS capsule Take 0.4 mg by mouth daily as needed (BPH).    . vitamin B-12 (CYANOCOBALAMIN) 1000 MCG tablet Take 1,000 mcg by mouth daily.    . carvedilol (COREG) 6.25 MG tablet TAKE ONE TABLET BY MOUTH TWICE DAILY WITH MEALS 60 tablet 2  . furosemide (LASIX) 40 MG tablet Take 1 tablet (40 mg total) by mouth 2 (two) times daily. Take one extra tablet daily for weight up 3 or more pounds  75 tablet 11  . ramipril (ALTACE) 10 MG capsule Take 10 mg by mouth daily.    Marland Kitchen spironolactone (ALDACTONE) 25 MG tablet TAKE ONE-HALF TABLET BY MOUTH ONCE DAILY 30 tablet 1   No facility-administered medications prior to visit.     Allergies:   Lisinopril   Social History   Social History  . Marital Status: Married    Spouse Name: N/A  . Number of Children: N/A  . Years of Education: N/A   Social History Main Topics  . Smoking status: Never Smoker   . Smokeless tobacco: Never Used  . Alcohol Use: 8.4 oz/week    7 Shots of liquor, 7 Standard drinks or equivalent per week     Comment: Daily  . Drug Use: No  . Sexual Activity: Not Currently   Other Topics Concern  . None   Social History Narrative     Family History:  The patient's family history includes Cancer (age of onset: 53) in his father; Heart attack in his brother; Heart disease in his brother, sister, and sister; Stroke in his mother. There is no history of Hypertension.   ROS:   Please see the history of present illness.    ROS All other systems reviewed and are negative.   PHYSICAL EXAM:   VS:  BP 92/58 mmHg  Pulse 78  SpO2 98%   GEN: Well nourished, well developed, in no acute distress HEENT: normal Neck: no JVD, carotid bruits, or masses Cardiac: RRR; no murmurs, rubs, or gallops,no edema  Respiratory:  clear to auscultation bilaterally, normal work of breathing GI:  soft, nontender, nondistended, + BS MS: no deformity or atrophy Skin: warm and dry, no rash Neuro:  Alert and Oriented x 3, Strength and sensation are intact Psych: euthymic mood, full affect  Wt Readings from Last 3 Encounters:  12/17/15 214 lb (97.07 kg)  12/17/15 214 lb (97.07 kg)  12/10/15 217 lb 1.9 oz (98.485 kg)      Studies/Labs Reviewed:   EKG: Prior NSR, HR 73, LAD, inf-lat TWI, PAC,  Recent Labs: 04/23/2015: B Natriuretic Peptide 1076.3* 04/29/2015: Pro B Natriuretic peptide (BNP) 113.0* 12/10/2015: ALT 17 12/17/2015: BUN 38*; Creatinine, Ser 2.88*; Hemoglobin 11.7*; Platelets 205; Potassium 5.2*; Sodium 141   Lipid Panel    Component Value Date/Time   CHOL 173 11/23/2014 0903   TRIG 182* 11/23/2014 0903   HDL 48 11/23/2014 0903   CHOLHDL 3.6 11/23/2014 0903   VLDL 36 11/23/2014 0903   LDLCALC 89 11/23/2014 0903    Additional studies/ records that were reviewed today include:  Prior notes reviewed, ECHO reviewed    ASSESSMENT:    1. Chronic systolic CHF (congestive heart failure) (HCC)   2. Paroxysmal atrial fibrillation ([place])   3. Hypotension due to drugs      PLAN:  In order of problems listed above:  1. Given his ongoing hypotension and resolution of ejection fraction to 50-55%, I will go ahead and discontinue his Lasix and spironolactone. Continue to have a Lasix available if his weight increases or if fluid increases and he may take this on an as-needed basis. I've also decided to decrease his carvedilol and REM Pro as well. If his blood pressure begins to elevate significantly, reinitiation or increase of carvedilol and ACE inhibitor can take place.    Medication Adjustments/Labs and Tests Ordered: Current medicines are reviewed at length with the patient today.  Concerns regarding medicines are outlined above.  Medication changes, Labs and Tests ordered today  are listed in the Patient Instructions below. Patient Instructions  Medication  Instructions:   STOP TAKING LASIX NOW  STOP TAKING SPIRONOLACTONE NOW  DECREASE CARVEDILOL TO 3.125 MG TWICE DAILY  DECREASE RAMIPRIL TO 2.5 MG ONCE DAILY     Follow-Up:  WITH AN EXTENDER IN OUR OFFICE IN ONE WEEK     If you need a refill on your cardiac medications before your next appointment, please call your pharmacy.      Addendum: 10:36 AM-upon checkout, he was feeling quite dizzy. We decided to bring him back in for a liter of IV fluid.  Following 1 L of IV fluids after IV placement. His blood pressure was in the 123456 systolic. He was able to stand without dizziness. He ambulated to the entrance of our office and felt comfortable going home.   Bobby Rumpf, MD  12/25/2015 10:26 AM    [address], [place], [place]  [address] Phone: [phone]; Fax: [phone]

## 2015-12-28 DIAGNOSIS — E860 Dehydration: Secondary | ICD-10-CM | POA: Diagnosis not present

## 2015-12-28 DIAGNOSIS — I10 Essential (primary) hypertension: Secondary | ICD-10-CM | POA: Diagnosis not present

## 2015-12-28 DIAGNOSIS — N183 Chronic kidney disease, stage 3 (moderate): Secondary | ICD-10-CM | POA: Diagnosis not present

## 2015-12-30 NOTE — Progress Notes (Signed)
Cardiology Office Note:    Date:  12/31/2015   ID:  [name] [name], DOB [birth year], MRN [id number]  PCP:  Stephens Shire, MD  Cardiologist:  Dr. Ena Dawley   Electrophysiologist:  n/a  Chief Complaint  Patient presents with  . Hypotension    Follow up    History of Present Illness:    [name] [name] is a 82 y.o. male with a hx of diabetes, HTN, BPH. He was admitted in [id number] with systolic/diastolic HF in the setting of AF with RVR. Cardiac enzymes were elevated consistent with a non-STEMI. Cardiac catheterization demonstrated moderate disease in the LAD, chronic total occlusion of the mid left circumflex and OM2 branch with right to left and left to left collaterals and moderate disease in the RCA. Medical therapy was recommended. CTO PCI of the LCx could be considered if the patient had symptoms of refractory angina. He converted to NSR on diltiazem drip. EF was 40 at 45% by echocardiogram.   Admitted 5/16 with a/c respiratory failure in the setting of a/c combined systolic and diastolic CHF and hypertensive emergency. Hospital stay c/b by AKI on CKD with IV diuresis, elevated Troponin levels.  OP stress testing could be considered. Repeat echo demonstrated worsening LV function with EF 30% felt to be likely depressed from hypertensive crisis.  OP sleep test to rule out sleep apnea recommended.   Echo in 8/16 demonstrated normal LVF, inf HK, Gr 1 diastolic dysfunction.  Seen by Dr. Ena Dawley 12/10/15.  Isosorbide and Ranolazine were added due to increased angina.  The patient was having frequent nosebleeds and had decreased Eliquis to QD.  This was DC'd. He was then seen by Dr. Candee Furbish on 12/25/15 with symptomatic hypotension.  He had been to the ED and received IVFs one week earlier. BP was 72/50.  Imdur had been stopped.  BP was still in the 90s when he saw Dr. Marlou Porch.  Lasix and Spironolactone were DC'd.  Carvedilol and Ramipril doses were also reduced.  He became symptomatic  again and received 1L IVFs again in our office.  He returns for FU.      Past Medical History  Diagnosis Date  . HLD (hyperlipidemia)   . NSTEMI (non-ST elevated myocardial infarction) ([place])   . Type II diabetes mellitus ([place])   . Paroxysmal atrial fibrillation (HCC)     a. diagnosed on 11/2014 admission. Spontaneously converted to NSR. Placed on Eliquis  . Chronic combined systolic and diastolic CHF (congestive heart failure) ([place])     a. 2d ECHO 11/24/14 with EF 40-45%, akinesis of the inferior, inferolateral and inferoseptal walls; overall mild to moderate reduction in LV function; grade 2 diastolic dysfunction; severe LAE; trace AI; moderate MR; mild TR with severely elevated pulmonary pressures. b. EF 25% in 04/2015, possibly due to HTN crisis.;  c. Echo 8/16:  Mild LVH, EF 50-55%  . CAD (coronary artery disease)     a. s/p LHC on 11/24/14 w/ mild-mod dz in LAD. CTO of mLCx and OM2 with R-->L and L-->L collaterals. b. Elevated trop 04/2015 felt due to demand ischemia.  . Pulmonary edema   . Renal insufficiency     a. Elev Cr 04/2015 requiring reduction in Eliquis.  . Obesity   . Hypertension   . History of echocardiogram     Echo 8/16:  Mild LVH, EF 50-55%, inf HK, Gr 1 DD, trivial AI, mild dilated aortic root, MAC, mild MR, severe LAE    Past Surgical History  Procedure  Laterality Date  . Appendectomy    . Total knee arthroplasty Left 2013  . Tonsillectomy    . Joint replacement    . Cardiac catheterization  11/24/2014  . Left heart catheterization with coronary angiogram N/A 11/24/2014    Procedure: LEFT HEART CATHETERIZATION WITH CORONARY ANGIOGRAM;  Surgeon: Jettie Booze, MD;  Location: [facility] CATH LAB;  Service: Cardiovascular;  Laterality: N/A;  . Esophagogastroduodenoscopy (egd) with propofol N/A 07/16/2015    Procedure: ESOPHAGOGASTRODUODENOSCOPY (EGD) WITH PROPOFOL;  Surgeon: Carol Ada, MD;  Location: [facility] ENDOSCOPY;  Service: Endoscopy;  Laterality: N/A;  .  Colonoscopy with propofol N/A 07/16/2015    Procedure: COLONOSCOPY WITH PROPOFOL;  Surgeon: Carol Ada, MD;  Location: [facility] ENDOSCOPY;  Service: Endoscopy;  Laterality: N/A;    Current Medications: Outpatient Prescriptions Prior to Visit  Medication Sig Dispense Refill  . acetaminophen (TYLENOL) 500 MG tablet Take 500 mg by mouth every 6 (six) hours as needed for moderate pain.    Marland Kitchen aspirin EC 81 MG tablet Take 1 tablet (81 mg total) by mouth daily. 90 tablet 3  . atorvastatin (LIPITOR) 40 MG tablet Take 1 tablet (40 mg total) by mouth daily at 6 PM. 90 tablet 3  . carvedilol (COREG) 3.125 MG tablet Take 1 tablet (3.125 mg total) by mouth 2 (two) times daily. 180 tablet 3  . Eluxadoline (VIBERZI) 75 MG TABS Take 75 mg by mouth daily.    . ferrous sulfate 325 (65 FE) MG EC tablet Take twice a day with orange juice. 120 tablet 3  . multivitamin-iron-minerals-folic acid (CENTRUM) chewable tablet Chew 1 tablet by mouth daily.    . nitroGLYCERIN (NITROSTAT) 0.4 MG SL tablet Place 1 tablet (0.4 mg total) under the tongue every 5 (five) minutes x 3 doses as needed for chest pain. 25 tablet 12  . ramipril (ALTACE) 2.5 MG capsule Take 1 capsule (2.5 mg total) by mouth daily. 90 capsule 3  . ranolazine (RANEXA) 1000 MG SR tablet Take 1 tablet (1,000 mg total) by mouth 2 (two) times daily. 180 tablet 3  . tamsulosin (FLOMAX) 0.4 MG CAPS capsule Take 0.4 mg by mouth daily as needed (BPH).    . vitamin B-12 (CYANOCOBALAMIN) 1000 MCG tablet Take 1,000 mcg by mouth daily.     No facility-administered medications prior to visit.     Allergies:   Lisinopril   Social History   Social History  . Marital Status: Married    Spouse Name: N/A  . Number of Children: N/A  . Years of Education: N/A   Social History Main Topics  . Smoking status: Never Smoker   . Smokeless tobacco: Never Used  . Alcohol Use: 8.4 oz/week    7 Shots of liquor, 7 Standard drinks or equivalent per week     Comment: Daily  .  Drug Use: No  . Sexual Activity: Not Currently   Other Topics Concern  . Not on file   Social History Narrative     Family History:  The patient's family history includes Cancer (age of onset: 44) in his father; Heart attack in his brother; Heart disease in his brother, sister, and sister; Stroke in his mother. There is no history of Hypertension.   ROS:   Please see the history of present illness.    ROS All other systems reviewed and are negative.   Physical Exam:    VS:  There were no vitals taken for this visit.   GEN: Well nourished, well developed, in no  acute distress HEENT: normal Neck: no JVD, no masses Cardiac: Normal S1/S2, RRR; no murmurs, rubs, or gallops, no edema;   carotid bruits,   Respiratory:  clear to auscultation bilaterally; no wheezing, rhonchi or rales GI: soft, nontender, nondistended, + BS MS: no deformity or atrophy Skin: warm and dry, no rash Neuro:  Bilateral strength equal, no focal deficits  Psych: Alert and oriented x 3, normal affect  Wt Readings from Last 3 Encounters:  12/17/15 214 lb (97.07 kg)  12/17/15 214 lb (97.07 kg)  12/10/15 217 lb 1.9 oz (98.485 kg)      Studies/Labs Reviewed:    EKG:  EKG is  ordered today.  The ekg ordered today demonstrates   Recent Labs: 04/23/2015: B Natriuretic Peptide 1076.3* 04/29/2015: Pro B Natriuretic peptide (BNP) 113.0* 12/10/2015: ALT 17 12/17/2015: BUN 38*; Creatinine, Ser 2.88*; Hemoglobin 11.7*; Platelets 205; Potassium 5.2*; Sodium 141   Recent Lipid Panel    Component Value Date/Time   CHOL 173 11/23/2014 0903   TRIG 182* 11/23/2014 0903   HDL 48 11/23/2014 0903   CHOLHDL 3.6 11/23/2014 0903   VLDL 36 11/23/2014 0903   LDLCALC 89 11/23/2014 0903    Additional studies/ records that were reviewed today include:   Echo 07/30/15 Mild LVH, EF 50-55%, inf HK, Gr 1 DD, trivial AI, mild dilated Ao root, MAC, mild MR, severe LAE  Echo 04/25/15 - Mild LVH. EF 30%. Severeglobal HK. Grade 1  DD.  - Ventricular Septal motion withdyssynergy consistent with intraventricularconduction delay. - Trivial AI - Mild aortic root dilatation. 42 mm(ED). - Ascending aorta 3.8 cm in maximal diameter. - MAC, mild MR - severe LAE. Volume/bsa, S: 46.40ml/m^2. - mild RAE - Mild TR, Trivial PI. Impressions:- When compared to the report dated 03/05/15, LVEF has significantlydeclined.  Echo 03/05/15 - EF 40% to 45%. There is hypokinesis of the inferiormyocardium. Grade 2 diastolic dysfunction). - Aortic valve: There was mild regurgitation. - Mitral valve: Calcified annulus. There was moderateregurgitation. - Left atrium: The atrium was moderately dilated. - Right ventricle: The cavity size was mildly dilated. Wallthickness was normal. - PA peak pressure: 41 mm Hg (S). Impressions: Compared to the prior study, there has been no significantinterval change.  LHC 11/24/14 LM: Patent LAD: Patent LCx: Occluded with right to left collaterals RCA: Mid moderate diffuse disease PCI: Unsuccessful attempt at PCI of the LCx-chronic total occlusion    ASSESSMENT:    1. Essential hypertension   2. Chronic combined systolic and diastolic heart failure ([place])   3. Coronary artery disease involving native coronary artery of native heart without angina pectoris   4. Paroxysmal atrial fibrillation (HCC)   5. Chronic kidney disease (CKD), stage III (moderate)   6. Hyperlipidemia     PLAN:    In order of problems listed above:  1. Chronic combined systolic and diastolic heart failure: Doing well. He is overall NYHA 2b. Worsening LV function during recent hospitalization was thought to be related to hypertensive emergency. He did have elevated troponins. Stress testing was considered. I do not think he needs stress testing. He denies any symptoms of angina. He will need a repeat echocardiogram in August. If his ejection fraction does not improve, consider cardiac catheterization versus  referral to EP for ICD implantation. Check FU BMET today.   2. Coronary artery disease involving native coronary artery of native heart without angina pectoris: No angina. He does not take aspirin as he is on Eliquis. Continue statin, beta blocker, ACE inhibitor. Circumflex is  known to be chronically occluded. If he has refractory angina, PCI can be considered of his LCx.   3. Paroxysmal atrial fibrillation: Maintaining NSR. Continue Eliquis 2.5 mg twice a day. Check BMET again today. If creatinine remains less than 1.5, consider changing Eliquis back to 5 mg twice a day. Check CBC as well.   4. CKD (chronic kidney disease), unspecified stage: Check BMET today.  5. Hyperlipidemia: Continue statin.  6. Essential hypertension: Better controlled.  7. Elevated troponin: Elevated troponins were likely related to chronic total occlusion of the circumflex in the setting of hypertensive emergency and acute pulmonary edema. I do not think he needs a stress test at this point. We will consider this in the future if he has symptoms suggestive of angina or he has no improvement in his EF (vs proceeding to [facility]).   8. Snoring: He declines arranging a sleep study.      Medication Adjustments/Labs and Tests Ordered: Current medicines are reviewed at length with the patient today.  Concerns regarding medicines are outlined above.  Medication changes, Labs and Tests ordered today are outlined in the Patient Instructions noted below. There are no Patient Instructions on file for this visit.   Signed, Richardson Dopp, PA-C  12/31/2015 11:39 AM    [facility] [address], [place], [place]  [address] Phone: [phone]; Fax: [phone]     This encounter was created in error - please disregard.

## 2015-12-31 ENCOUNTER — Encounter: Payer: PPO | Admitting: Physician Assistant

## 2015-12-31 ENCOUNTER — Ambulatory Visit: Payer: PPO | Admitting: Physician Assistant

## 2016-01-04 DIAGNOSIS — R799 Abnormal finding of blood chemistry, unspecified: Secondary | ICD-10-CM | POA: Diagnosis not present

## 2016-01-15 ENCOUNTER — Telehealth: Payer: Self-pay | Admitting: Cardiology

## 2016-01-15 NOTE — Telephone Encounter (Signed)
Patient calling the office for samples of medication:   1.  What medication and dosage are you requesting samples for? Ranexa 1000mg    2.  Are you currently out of this medication? yes

## 2016-01-15 NOTE — Telephone Encounter (Signed)
Samples placed at the front desk. Patient aware. 

## 2016-01-20 ENCOUNTER — Encounter (HOSPITAL_COMMUNITY): Payer: Self-pay | Admitting: Vascular Surgery

## 2016-01-20 ENCOUNTER — Emergency Department (HOSPITAL_COMMUNITY): Payer: PPO

## 2016-01-20 ENCOUNTER — Inpatient Hospital Stay (HOSPITAL_COMMUNITY)
Admission: EM | Admit: 2016-01-20 | Discharge: 2016-01-24 | DRG: 280 | Disposition: A | Discharge status: Home / Self Care | Payer: PPO | Attending: Cardiovascular Disease | Admitting: Cardiovascular Disease

## 2016-01-20 ENCOUNTER — Inpatient Hospital Stay (HOSPITAL_COMMUNITY): Payer: PPO

## 2016-01-20 DIAGNOSIS — E669 Obesity, unspecified: Secondary | ICD-10-CM | POA: Diagnosis present

## 2016-01-20 DIAGNOSIS — E785 Hyperlipidemia, unspecified: Secondary | ICD-10-CM | POA: Diagnosis not present

## 2016-01-20 DIAGNOSIS — Z79899 Other long term (current) drug therapy: Secondary | ICD-10-CM

## 2016-01-20 DIAGNOSIS — I130 Hypertensive heart and chronic kidney disease with heart failure and stage 1 through stage 4 chronic kidney disease, or unspecified chronic kidney disease: Secondary | ICD-10-CM | POA: Diagnosis not present

## 2016-01-20 DIAGNOSIS — R0602 Shortness of breath: Secondary | ICD-10-CM | POA: Diagnosis not present

## 2016-01-20 DIAGNOSIS — I447 Left bundle-branch block, unspecified: Secondary | ICD-10-CM | POA: Diagnosis not present

## 2016-01-20 DIAGNOSIS — J90 Pleural effusion, not elsewhere classified: Secondary | ICD-10-CM | POA: Diagnosis not present

## 2016-01-20 DIAGNOSIS — R079 Chest pain, unspecified: Secondary | ICD-10-CM | POA: Diagnosis not present

## 2016-01-20 DIAGNOSIS — Z96652 Presence of left artificial knee joint: Secondary | ICD-10-CM | POA: Diagnosis not present

## 2016-01-20 DIAGNOSIS — Z8249 Family history of ischemic heart disease and other diseases of the circulatory system: Secondary | ICD-10-CM

## 2016-01-20 DIAGNOSIS — R042 Hemoptysis: Secondary | ICD-10-CM | POA: Diagnosis present

## 2016-01-20 DIAGNOSIS — I25119 Atherosclerotic heart disease of native coronary artery with unspecified angina pectoris: Secondary | ICD-10-CM | POA: Diagnosis not present

## 2016-01-20 DIAGNOSIS — D509 Iron deficiency anemia, unspecified: Secondary | ICD-10-CM | POA: Diagnosis not present

## 2016-01-20 DIAGNOSIS — R0600 Dyspnea, unspecified: Secondary | ICD-10-CM

## 2016-01-20 DIAGNOSIS — I2119 ST elevation (STEMI) myocardial infarction involving other coronary artery of inferior wall: Principal | ICD-10-CM | POA: Diagnosis present

## 2016-01-20 DIAGNOSIS — N184 Chronic kidney disease, stage 4 (severe): Secondary | ICD-10-CM | POA: Diagnosis not present

## 2016-01-20 DIAGNOSIS — I252 Old myocardial infarction: Secondary | ICD-10-CM

## 2016-01-20 DIAGNOSIS — Z7982 Long term (current) use of aspirin: Secondary | ICD-10-CM

## 2016-01-20 DIAGNOSIS — I25110 Atherosclerotic heart disease of native coronary artery with unstable angina pectoris: Secondary | ICD-10-CM | POA: Diagnosis not present

## 2016-01-20 DIAGNOSIS — I340 Nonrheumatic mitral (valve) insufficiency: Secondary | ICD-10-CM | POA: Diagnosis not present

## 2016-01-20 DIAGNOSIS — I255 Ischemic cardiomyopathy: Secondary | ICD-10-CM | POA: Diagnosis not present

## 2016-01-20 DIAGNOSIS — I219 Acute myocardial infarction, unspecified: Secondary | ICD-10-CM | POA: Diagnosis present

## 2016-01-20 DIAGNOSIS — D472 Monoclonal gammopathy: Secondary | ICD-10-CM | POA: Diagnosis not present

## 2016-01-20 DIAGNOSIS — I480 Paroxysmal atrial fibrillation: Secondary | ICD-10-CM | POA: Diagnosis not present

## 2016-01-20 DIAGNOSIS — N289 Disorder of kidney and ureter, unspecified: Secondary | ICD-10-CM | POA: Diagnosis not present

## 2016-01-20 DIAGNOSIS — I119 Hypertensive heart disease without heart failure: Secondary | ICD-10-CM

## 2016-01-20 DIAGNOSIS — I5043 Acute on chronic combined systolic (congestive) and diastolic (congestive) heart failure: Secondary | ICD-10-CM | POA: Diagnosis present

## 2016-01-20 DIAGNOSIS — N183 Chronic kidney disease, stage 3 unspecified: Secondary | ICD-10-CM | POA: Diagnosis present

## 2016-01-20 DIAGNOSIS — I214 Non-ST elevation (NSTEMI) myocardial infarction: Secondary | ICD-10-CM

## 2016-01-20 DIAGNOSIS — J810 Acute pulmonary edema: Secondary | ICD-10-CM | POA: Diagnosis not present

## 2016-01-20 DIAGNOSIS — Z6833 Body mass index (BMI) 33.0-33.9, adult: Secondary | ICD-10-CM | POA: Diagnosis not present

## 2016-01-20 DIAGNOSIS — I2510 Atherosclerotic heart disease of native coronary artery without angina pectoris: Secondary | ICD-10-CM | POA: Diagnosis present

## 2016-01-20 DIAGNOSIS — E1122 Type 2 diabetes mellitus with diabetic chronic kidney disease: Secondary | ICD-10-CM | POA: Diagnosis present

## 2016-01-20 DIAGNOSIS — E1169 Type 2 diabetes mellitus with other specified complication: Secondary | ICD-10-CM

## 2016-01-20 DIAGNOSIS — R011 Cardiac murmur, unspecified: Secondary | ICD-10-CM | POA: Insufficient documentation

## 2016-01-20 DIAGNOSIS — R0902 Hypoxemia: Secondary | ICD-10-CM | POA: Diagnosis not present

## 2016-01-20 DIAGNOSIS — D638 Anemia in other chronic diseases classified elsewhere: Secondary | ICD-10-CM | POA: Diagnosis present

## 2016-01-20 DIAGNOSIS — R000 Tachycardia, unspecified: Secondary | ICD-10-CM | POA: Diagnosis not present

## 2016-01-20 DIAGNOSIS — K5900 Constipation, unspecified: Secondary | ICD-10-CM | POA: Diagnosis present

## 2016-01-20 HISTORY — DX: Hypertensive heart disease without heart failure: I11.9

## 2016-01-20 HISTORY — DX: Nonrheumatic mitral (valve) insufficiency: I34.0

## 2016-01-20 HISTORY — DX: Hemoptysis: R04.2

## 2016-01-20 HISTORY — DX: Ischemic cardiomyopathy: I25.5

## 2016-01-20 LAB — BASIC METABOLIC PANEL
Anion gap: 14 (ref 5–15)
BUN: 25 mg/dL — AB (ref 6–20)
CO2: 21 mmol/L — ABNORMAL LOW (ref 22–32)
CREATININE: 2.04 mg/dL — AB (ref 0.61–1.24)
Calcium: 9.6 mg/dL (ref 8.9–10.3)
Chloride: 106 mmol/L (ref 101–111)
GFR calc Af Amer: 33 mL/min — ABNORMAL LOW (ref >60)
GFR, EST NON AFRICAN AMERICAN: 28 mL/min — AB (ref >60)
Glucose, Bld: 220 mg/dL — ABNORMAL HIGH (ref 65–99)
Potassium: 5.1 mmol/L (ref 3.5–5.1)
SODIUM: 141 mmol/L (ref 135–145)

## 2016-01-20 LAB — MRSA PCR SCREENING: MRSA BY PCR: NEGATIVE

## 2016-01-20 LAB — BRAIN NATRIURETIC PEPTIDE: B Natriuretic Peptide: 1905.6 pg/mL — ABNORMAL HIGH (ref 0.0–100.0)

## 2016-01-20 LAB — TROPONIN I
Troponin I: >65 ng/mL (ref <0.031)
Troponin I: >65 ng/mL (ref <0.031)

## 2016-01-20 LAB — HEPARIN LEVEL (UNFRACTIONATED)
HEPARIN UNFRACTIONATED: 1.42 [IU]/mL — AB (ref 0.30–0.70)
Heparin Unfractionated: 1 IU/mL — ABNORMAL HIGH (ref 0.30–0.70)

## 2016-01-20 LAB — I-STAT TROPONIN, ED

## 2016-01-20 LAB — CBC
HCT: 36 % — ABNORMAL LOW (ref 39.0–52.0)
Hemoglobin: 11.6 g/dL — ABNORMAL LOW (ref 13.0–17.0)
MCH: 30.1 pg (ref 26.0–34.0)
MCHC: 32.2 g/dL (ref 30.0–36.0)
MCV: 93.3 fL (ref 78.0–100.0)
PLATELETS: 206 10*3/uL (ref 150–400)
RBC: 3.86 MIL/uL — ABNORMAL LOW (ref 4.22–5.81)
RDW: 14.3 % (ref 11.5–15.5)
WBC: 13.2 10*3/uL — ABNORMAL HIGH (ref 4.0–10.5)

## 2016-01-20 LAB — SEDIMENTATION RATE: SED RATE: 47 mm/h — AB (ref 0–16)

## 2016-01-20 LAB — C-REACTIVE PROTEIN: CRP: 10.2 mg/dL — ABNORMAL HIGH (ref <1.0)

## 2016-01-20 MED ORDER — VITAMIN B-12 1000 MCG PO TABS
1000.0000 ug | ORAL_TABLET | Freq: Every day | ORAL | Status: DC
Start: 2016-01-20 — End: 2016-01-24
  Administered 2016-01-20 – 2016-01-24 (×5): 1000 ug via ORAL
  Filled 2016-01-20 (×5): qty 1

## 2016-01-20 MED ORDER — HEPARIN BOLUS VIA INFUSION
4000.0000 [IU] | Freq: Once | INTRAVENOUS | Status: AC
  Administered 2016-01-20: 4000 [IU] via INTRAVENOUS
  Filled 2016-01-20: qty 4000

## 2016-01-20 MED ORDER — ADULT MULTIVITAMIN W/MINERALS CH
1.0000 | ORAL_TABLET | Freq: Every day | ORAL | Status: DC
  Administered 2016-01-21 – 2016-01-24 (×4): 1 via ORAL
  Filled 2016-01-20 (×4): qty 1

## 2016-01-20 MED ORDER — RANOLAZINE ER 500 MG PO TB12
1000.0000 mg | ORAL_TABLET | Freq: Two times a day (BID) | ORAL | Status: DC
  Administered 2016-01-20 – 2016-01-24 (×8): 1000 mg via ORAL
  Filled 2016-01-20 (×8): qty 2

## 2016-01-20 MED ORDER — ASPIRIN EC 81 MG PO TBEC
81.0000 mg | DELAYED_RELEASE_TABLET | Freq: Every day | ORAL | Status: DC
Start: 2016-01-20 — End: 2016-01-24
  Administered 2016-01-21 – 2016-01-24 (×4): 81 mg via ORAL
  Filled 2016-01-20 (×4): qty 1

## 2016-01-20 MED ORDER — HEPARIN (PORCINE) IN NACL 100-0.45 UNIT/ML-% IJ SOLN
900.0000 [IU]/h | INTRAMUSCULAR | Status: DC
  Administered 2016-01-20: 1250 [IU]/h via INTRAVENOUS
  Administered 2016-01-21: 900 [IU]/h via INTRAVENOUS
  Filled 2016-01-20 (×2): qty 250

## 2016-01-20 MED ORDER — CARVEDILOL 3.125 MG PO TABS
3.1250 mg | ORAL_TABLET | Freq: Two times a day (BID) | ORAL | Status: DC
  Administered 2016-01-20 – 2016-01-21 (×3): 3.125 mg via ORAL
  Filled 2016-01-20 (×3): qty 1

## 2016-01-20 MED ORDER — ACETAMINOPHEN 500 MG PO TABS: 500.0000 mg | ORAL_TABLET | Freq: Four times a day (QID) | ORAL | Status: DC | PRN

## 2016-01-20 MED ORDER — FERROUS SULFATE 325 (65 FE) MG PO TABS
325.0000 mg | ORAL_TABLET | Freq: Two times a day (BID) | ORAL | Status: DC
  Administered 2016-01-20 – 2016-01-24 (×8): 325 mg via ORAL
  Filled 2016-01-20 (×10): qty 1

## 2016-01-20 MED ORDER — TAMSULOSIN HCL 0.4 MG PO CAPS: 0.4000 mg | ORAL_CAPSULE | Freq: Every day | ORAL | Status: DC | PRN

## 2016-01-20 MED ORDER — CETYLPYRIDINIUM CHLORIDE 0.05 % MT LIQD
7.0000 mL | Freq: Two times a day (BID) | OROMUCOSAL | Status: DC
  Administered 2016-01-20 – 2016-01-24 (×7): 7 mL via OROMUCOSAL

## 2016-01-20 MED ORDER — ASPIRIN 81 MG PO CHEW
324.0000 mg | CHEWABLE_TABLET | Freq: Once | ORAL | Status: AC
  Administered 2016-01-20: 324 mg via ORAL
  Filled 2016-01-20: qty 4

## 2016-01-20 MED ORDER — NITROGLYCERIN IN D5W 200-5 MCG/ML-% IV SOLN
3.0000 ug/min | INTRAVENOUS | Status: DC
  Administered 2016-01-20: 5 ug/min via INTRAVENOUS
  Filled 2016-01-20: qty 250

## 2016-01-20 MED ORDER — CENTRUM PO CHEW: 1.0000 | CHEWABLE_TABLET | Freq: Every day | ORAL | Status: DC

## 2016-01-20 MED ORDER — ATORVASTATIN CALCIUM 40 MG PO TABS
40.0000 mg | ORAL_TABLET | Freq: Every day | ORAL | Status: DC
  Administered 2016-01-20 – 2016-01-23 (×4): 40 mg via ORAL
  Filled 2016-01-20 (×4): qty 1

## 2016-01-20 MED ORDER — ONDANSETRON HCL 4 MG/2ML IJ SOLN: 4.0000 mg | Freq: Four times a day (QID) | INTRAMUSCULAR | Status: DC | PRN

## 2016-01-20 NOTE — ED Provider Notes (Signed)
CSN: [id number]     Arrival date & time 01/20/16  1205 History   First MD Initiated Contact with Patient 01/20/16 1306     Chief Complaint  Patient presents with  . Chest Pain  . Hemoptysis     (Consider location/radiation/quality/duration/timing/severity/associated sxs/prior Treatment) HPI Patient developed a sudden onset of chest pain yesterday. It was severe at onset. He reports it has persisted since then. It's a heaviness on his left chest. He reports yesterday one episode of coughing up red blood clot. He has not happened again. He reports he is getting short of breath with minimal activity. Feels slightly weak. It was no syncopal episode. Chest pain is worsened by exertion. She denies calf pain or lower extremity swelling. No history of pulmonary embolus. He currently takes a daily aspirin. Past Medical History  Diagnosis Date  . HLD (hyperlipidemia)   . NSTEMI (non-ST elevated myocardial infarction) ([place])   . Type II diabetes mellitus ([place])   . Paroxysmal atrial fibrillation (HCC)     a. diagnosed on 11/2014 admission. Spontaneously converted to NSR. Placed on Eliquis  . Chronic combined systolic and diastolic CHF (congestive heart failure) ([place])     a. 2d ECHO 11/24/14 with EF 40-45%, akinesis of the inferior, inferolateral and inferoseptal walls; overall mild to moderate reduction in LV function; grade 2 diastolic dysfunction; severe LAE; trace AI; moderate MR; mild TR with severely elevated pulmonary pressures. b. EF 25% in 04/2015, possibly due to HTN crisis.;  c. Echo 8/16:  Mild LVH, EF 50-55%  . CAD (coronary artery disease)     a. s/p LHC on 11/24/14 w/ mild-mod dz in LAD. CTO of mLCx and OM2 with R-->L and L-->L collaterals. b. Elevated trop 04/2015 felt due to demand ischemia.  . Pulmonary edema   . Renal insufficiency     a. Elev Cr 04/2015 requiring reduction in Eliquis.  . Obesity   . Hypertension   . History of echocardiogram     Echo 8/16:  Mild LVH, EF 50-55%, inf  HK, Gr 1 DD, trivial AI, mild dilated aortic root, MAC, mild MR, severe LAE   Past Surgical History  Procedure Laterality Date  . Appendectomy    . Total knee arthroplasty Left 2013  . Tonsillectomy    . Joint replacement    . Cardiac catheterization  11/24/2014  . Left heart catheterization with coronary angiogram N/A 11/24/2014    Procedure: LEFT HEART CATHETERIZATION WITH CORONARY ANGIOGRAM;  Surgeon: Jettie Booze, MD;  Location: [facility] CATH LAB;  Service: Cardiovascular;  Laterality: N/A;  . Esophagogastroduodenoscopy (egd) with propofol N/A 07/16/2015    Procedure: ESOPHAGOGASTRODUODENOSCOPY (EGD) WITH PROPOFOL;  Surgeon: Carol Ada, MD;  Location: [facility] ENDOSCOPY;  Service: Endoscopy;  Laterality: N/A;  . Colonoscopy with propofol N/A 07/16/2015    Procedure: COLONOSCOPY WITH PROPOFOL;  Surgeon: Carol Ada, MD;  Location: [facility] ENDOSCOPY;  Service: Endoscopy;  Laterality: N/A;   Family History  Problem Relation Age of Onset  . Cancer Father 74    Deceased  . Stroke Mother     Deceased  . Heart attack Brother     Deceased. Had cath with stents followed by PE  . Hypertension Neg Hx   . Heart disease Brother   . Heart disease Sister   . Heart disease Sister    Social History  Substance Use Topics  . Smoking status: Never Smoker   . Smokeless tobacco: Never Used  . Alcohol Use: 8.4 oz/week    7 Shots  of liquor, 7 Standard drinks or equivalent per week     Comment: Daily    Review of Systems  10 Systems reviewed and are negative for acute change except as noted in the HPI.   Allergies  Lisinopril  Home Medications   Prior to Admission medications   Medication Sig Start Date End Date Taking? Authorizing Provider  acetaminophen (TYLENOL) 500 MG tablet Take 500 mg by mouth every 6 (six) hours as needed for moderate pain.   Yes Historical Provider, MD  aspirin EC 81 MG tablet Take 1 tablet (81 mg total) by mouth daily. 12/10/15  Yes Dorothy Spark, MD  atorvastatin  (LIPITOR) 40 MG tablet Take 1 tablet (40 mg total) by mouth daily at 6 PM. 04/29/15  Yes Scott T Kathlen Mody, PA-C  carvedilol (COREG) 3.125 MG tablet Take 1 tablet (3.125 mg total) by mouth 2 (two) times daily. 12/25/15  Yes Jerline Pain, MD  Eluxadoline (VIBERZI) 75 MG TABS Take 75 mg by mouth daily.   Yes Historical Provider, MD  ferrous sulfate 325 (65 FE) MG EC tablet Take twice a day with orange juice. 08/18/15  Yes Wyatt Portela, MD  multivitamin-iron-minerals-folic acid (CENTRUM) chewable tablet Chew 1 tablet by mouth daily.   Yes Historical Provider, MD  ramipril (ALTACE) 2.5 MG capsule Take 1 capsule (2.5 mg total) by mouth daily. 12/25/15  Yes Jerline Pain, MD  ranolazine (RANEXA) 1000 MG SR tablet Take 1 tablet (1,000 mg total) by mouth 2 (two) times daily. 12/10/15  Yes Dorothy Spark, MD  tamsulosin (FLOMAX) 0.4 MG CAPS capsule Take 0.4 mg by mouth daily as needed (BPH).   Yes Historical Provider, MD  vitamin B-12 (CYANOCOBALAMIN) 1000 MCG tablet Take 1,000 mcg by mouth daily.   Yes Historical Provider, MD  nitroGLYCERIN (NITROSTAT) 0.4 MG SL tablet Place 1 tablet (0.4 mg total) under the tongue every 5 (five) minutes x 3 doses as needed for chest pain. 11/26/14   Eileen Stanford, PA-C   BP 140/84 mmHg  Pulse 99  Temp(Src) 97.6 F (36.4 C) (Oral)  Resp 21  Ht 5\' 8"  (1.727 m)  Wt 221 lb 12.8 oz (100.608 kg)  BMI 33.73 kg/m2  SpO2 93% Physical Exam  Constitutional: He is oriented to person, place, and time. He appears well-developed and well-nourished.  Patient has mild tachypnea at rest. He is alert and nontoxic. He is speaking in full short sentences. Color is good.  HENT:  Head: Normocephalic and atraumatic.  Mouth/Throat: Oropharynx is clear and moist.  Eyes: EOM are normal. Pupils are equal, round, and reactive to light.  Neck: Neck supple.  Cardiovascular: Normal rate and intact distal pulses.   Tachycardia. 2/6 systolic ejection murmur.  Pulmonary/Chest: Effort normal  and breath sounds normal.  Abdominal: Soft. Bowel sounds are normal. He exhibits no distension. There is no tenderness.  Musculoskeletal: Normal range of motion. He exhibits no edema or tenderness.  Neurological: He is alert and oriented to person, place, and time. He has normal strength. Coordination normal. GCS eye subscore is 4. GCS verbal subscore is 5. GCS motor subscore is 6.  Skin: Skin is warm, dry and intact.  Psychiatric: He has a normal mood and affect.    ED Course  Procedures (including critical care time) CRITICAL CARE Performed by: Charlesetta Shanks   Total critical care time: 30 minutes  Critical care time was exclusive of separately billable procedures and treating other patients.  Critical care was necessary to treat  or prevent imminent or life-threatening deterioration.  Critical care was time spent personally by me on the following activities: development of treatment plan with patient and/or surrogate as well as nursing, discussions with consultants, evaluation of patient's response to treatment, examination of patient, obtaining history from patient or surrogate, ordering and performing treatments and interventions, ordering and review of laboratory studies, ordering and review of radiographic studies, pulse oximetry and re-evaluation of patient's condition. Labs Review Labs Reviewed  BASIC METABOLIC PANEL - Abnormal; Notable for the following:    CO2 21 (*)    Glucose, Bld 220 (*)    BUN 25 (*)    Creatinine, Ser 2.04 (*)    GFR calc non Af Amer 28 (*)    GFR calc Af Amer 33 (*)    All other components within normal limits  CBC - Abnormal; Notable for the following:    WBC 13.2 (*)    RBC 3.86 (*)    Hemoglobin 11.6 (*)    HCT 36.0 (*)    All other components within normal limits  I-STAT TROPOININ, ED - Abnormal; Notable for the following:    Troponin i, poc >35.00 (*)    All other components within normal limits  HEPARIN LEVEL (UNFRACTIONATED)  HEPARIN  LEVEL (UNFRACTIONATED)  ANCA TITERS  MPO/PR-3 (ANCA) ANTIBODIES  SEDIMENTATION RATE  C-REACTIVE PROTEIN  ANTINUCLEAR ANTIBODIES, IFA  BRAIN NATRIURETIC PEPTIDE    Imaging Review Dg Chest 2 View  01/20/2016  CLINICAL DATA:  Left-sided chest pain shortness of breath for 2 days EXAM: CHEST  2 VIEW COMPARISON:  04/24/2015 FINDINGS: Cardiac shadow is at the upper limits of normal in size. Some platelike atelectasis is noted in the left lung base which may be related to scarring. Mild vascular congestion is again seen with changes of interstitial edema. No focal confluent infiltrate is seen. IMPRESSION: Mild CHF with interstitial edema. Electronically Signed   By: Inez Catalina M.D.   On: 01/20/2016 12:54   I have personally reviewed and evaluated these images and lab results as part of my medical decision-making.   EKG Interpretation   Date/Time:  Wednesday January 20 2016 12:18:46 EST Ventricular Rate:  109 PR Interval:  162 QRS Duration: 122 QT Interval:  346 QTC Calculation: 465 R Axis:   -38 Text Interpretation:  Sinus tachycardia Left axis deviation Possible  Anterior infarct , age undetermined Abnormal ECG Confirmed by Johnney Killian,  MD, Jeannie Done [id number]) on 01/20/2016 1:23:15 PM     Consult: (13:34) Cardiologist to return call directly. :Consult: Dr. Debby Bud has evaluated the patient in the ED and written admission arders. MDM   Final diagnoses:  Hemoptysis   Patient presents with chest pain and significant elevated troponin. His pain started yesterday. EKG does not show STEMI pattern. Findings consistent with NSTEMI in patient with known severe coronary artery disease. Heparin protocol initiated and aspirin given. At this time his airway and blood pressures are stable. Patient will be admitted to cardiology.    Charlesetta Shanks, MD 01/20/16 [phone]

## 2016-01-20 NOTE — Progress Notes (Signed)
ANTICOAGULATION CONSULT NOTE - Follow Up Consult  Pharmacy Consult for Heparin  Indication: chest pain/ACS  Allergies  Allergen Reactions  . Lisinopril     cough   Patient Measurements: Height: 5\' 8"  (172.7 cm) Weight: 221 lb 12.8 oz (100.608 kg) IBW/kg (Calculated) : 68.4  Vital Signs: Temp: 97.8 F (36.6 C) (02/15 2000) Temp Source: Oral (02/15 2000) BP: 150/81 mmHg (02/15 2100) Pulse Rate: 101 (02/15 2100)  Labs:  Recent Labs  01/20/16 1236 01/20/16 1505 01/20/16 1605 01/20/16 2237  HGB 11.6*  --   --   --   HCT 36.0*  --   --   --   PLT 206  --   --   --   HEPARINUNFRC  --  1.42*  --  1.00*  CREATININE 2.04*  --   --   --   TROPONINI  --   --  >65.00*  --     Estimated Creatinine Clearance: 31.6 mL/min (by C-G formula based on Cr of 2.04).  Assessment: Heparin for acute MI, markedly elevated troponin, pt having some mild/stable hemoptysis  Goal of Therapy:  Heparin level 0.3-0.7 units/ml Monitor platelets by anticoagulation protocol: Yes   Plan:  -Decrease heparin drip to 1000 units/hr -HL with AM labs at 0500  Casius, Melchior 01/20/2016,11:13 PM

## 2016-01-20 NOTE — Consult Note (Signed)
ANTICOAGULATION CONSULT NOTE - Initial Consult  Pharmacy Consult for heparin Indication: chest pain/ACS  Allergies  Allergen Reactions  . Lisinopril     cough    Patient Measurements: Height: 5\' 8"  (172.7 cm) Weight: 221 lb 12.8 oz (100.608 kg) IBW/kg (Calculated) : 68.4 Heparin Dosing Weight: 90kg  Labs:  Recent Labs  01/20/16 1236  HGB 11.6*  HCT 36.0*  PLT 206  CREATININE 2.04*    Estimated Creatinine Clearance: 31.6 mL/min (by C-G formula based on Cr of 2.04).   Assessment: 82 yo male w/ chest pain and reports previous hemoptysis, but there is no active bleeding reported. Pt previously on Eliquis, which was stopped due to recurrent nose bleeds.   Goal of Therapy:  Heparin level 0.3-0.7 units/ml Monitor platelets by anticoagulation protocol: Yes   Plan:  Give 4000 units bolus x 1 Start heparin infusion at 1250 units/hr Check anti-Xa level in 8 hours and daily while on heparin Continue to monitor H&H and platelets  Joya San, PharmD Clinical Pharmacy Resident Pager # [phone] 01/20/2016 2:05 PM

## 2016-01-20 NOTE — ED Notes (Signed)
Pt reports to the ED for eval of chest heaviness and hemoptysis. Symptoms began last pm and became worse today. Pt reports his sputum is bright red throughout and thick. Pt reports he also has symptoms of SOB with minimal exertion, generalized weakness, and lightheadedness. Pt reports exertion makes the pain worse. Pt A&Ox4, resp e/u, and skin warm and dry.

## 2016-01-20 NOTE — Progress Notes (Signed)
Utilization Review Completed.Donne Anon T2/15/2017

## 2016-01-20 NOTE — H&P (Signed)
Reason for Admission: Myocardial infarction with delayed presentation  Primary care Physician: Ellison Carwin  Cardiologist: Meda Coffee   HPI: This is a 82 y.o. male with a past medical history significant for extensive coronary artery disease (and STEMI due to total occlusion of the left circumflex was coronary artery December 2015), mild ischemic cardiomyopathy (EF recently increased from 40% to 50% by echo) , chronic combined systolic and diastolic heart failure, paroxysmal atrial fibrillation, chronic kidney disease stage III-IV, obesity, hypertension, type 2 diabetes mellitus not requiring insulin presents with complaints of chest pressure and dyspnea since yesterday afternoon as well as a few episodes of hemoptysis.  Over the last several weeks he has noticed intermittent exertional dyspnea and chest tightness. Usually this would resolve promptly with rest. Yesterday afternoon after light exertion (setting out food for his cats and dogs in (he expresses severe precordial chest tightness and extreme shortness of breath. The symptoms were not relieved with rest. He did not think to take his sublingual nitroglycerin. He coughed a few specks of bright red blood a couple of times this afternoon, but none this morning. He had a difficult night with orthopnea and persistent chest pressure.   He feels a little better right now. He has not had hemoptysis in several hours. He continues to have mild anterior chest discomfort and significant dyspnea at rest. He is able to lie at a 45 angle without much respiratory distress. He speaks in interrupted sentences.  Today his chest x-ray shows mild CHF with interstitial edema. Cardiac troponin I is greater than 35. The electrocardiogram shows mild sinus tachycardia, intraventricular conduction delay most closely resembling a left bundle branch block with pre-existing left axis deviation and widespread repolarization abnormalities.  Coronary  angiography in December 2015 showed mild to moderate disease in the LAD, chronic total occlusion of the mid left circumflex and second OM branch (fed by right to left and left left collaterals), moderate disease in the mid right coronary artery.   Echocardiography   in December 2015 showed LVEF 40-45 percent with inferolateral, inferior and inferoseptal akinesis, pseudo-normal mitral inflow, moderate mitral insufficiency and severely increased pulmonary artery systolic pressure estimated at 60 mmHg. The left atrium was described to severely dilated. Follow-up echo in August 2016 showed LVEF 50-55 percent with basal inferior hypokinesis, abnormal relaxation and only mild mitral insufficiency with a PA pressure that had decreased to 27 mmHg.   Earlier this year his diuretics were curtailed and his dose of beta blocker decreased due to symptomatic hypotension and perceived hypovolemia. Also this year, anticoagulation with Eliquis was discontinued due to frequent epistaxis and anemia. He is still taking iron supplements.  PMHx:  Past Medical History  Diagnosis Date  . HLD (hyperlipidemia)   . NSTEMI (non-ST elevated myocardial infarction) ([place])   . Type II diabetes mellitus ([place])   . Paroxysmal atrial fibrillation ([facility])     a. diagnosed on 11/2014 admission. Spontaneously converted to NSR. Placed on Eliquis  . Chronic combined systolic and diastolic CHF (congestive heart failure) ([place])     a. 2d ECHO 11/24/14 with EF 40-45%, akinesis of the inferior, inferolateral and inferoseptal walls; overall mild to moderate reduction in LV function; grade 2 diastolic dysfunction; severe LAE; trace AI; moderate MR; mild TR with severely elevated pulmonary pressures. b. EF 25% in 04/2015, possibly due to HTN crisis.;  c. Echo 8/16:  Mild LVH, EF 50-55%  . CAD (coronary artery disease)     a. s/p LHC on 11/24/14  w/ mild-mod dz in LAD. CTO of mLCx and OM2 with R-->L and L-->L collaterals. b. Elevated trop 04/2015 felt  due to demand ischemia.  . Pulmonary edema   . Renal insufficiency     a. Elev Cr 04/2015 requiring reduction in Eliquis.  . Obesity   . Hypertension   . History of echocardiogram     Echo 8/16:  Mild LVH, EF 50-55%, inf HK, Gr 1 DD, trivial AI, mild dilated aortic root, MAC, mild MR, severe LAE   Past Surgical History  Procedure Laterality Date  . Appendectomy    . Total knee arthroplasty Left 2013  . Tonsillectomy    . Joint replacement    . Cardiac catheterization  11/24/2014  . Left heart catheterization with coronary angiogram N/A 11/24/2014    Procedure: LEFT HEART CATHETERIZATION WITH CORONARY ANGIOGRAM;  Surgeon: Jettie Booze, MD;  Location: [facility] CATH LAB;  Service: Cardiovascular;  Laterality: N/A;  . Esophagogastroduodenoscopy (egd) with propofol N/A 07/16/2015    Procedure: ESOPHAGOGASTRODUODENOSCOPY (EGD) WITH PROPOFOL;  Surgeon: Carol Ada, MD;  Location: [facility] ENDOSCOPY;  Service: Endoscopy;  Laterality: N/A;  . Colonoscopy with propofol N/A 07/16/2015    Procedure: COLONOSCOPY WITH PROPOFOL;  Surgeon: Carol Ada, MD;  Location: [facility];  Service: Endoscopy;  Laterality: N/A;    FAMHx: Family History  Problem Relation Age of Onset  . Cancer Father 38    Deceased  . Stroke Mother     Deceased  . Heart attack Brother     Deceased. Had cath with stents followed by PE  . Hypertension Neg Hx   . Heart disease Brother   . Heart disease Sister   . Heart disease Sister     SOCHx:  reports that he has never smoked. He has never used smokeless tobacco. He reports that he drinks about 8.4 oz of alcohol per week. He reports that he does not use illicit drugs.  ALLERGIES: Allergies  Allergen Reactions  . Lisinopril     cough    ROS: He denies fever, chills, pleuritic chest pain, dizziness, lightheadedness, syncope, palpitations, calf edema or tenderness, abdominal discomfort, change in bowel pattern, melena or hematochezia, dysuria (other than chronic  hesitancy) focal neurological deficits, intolerance to heat or cold, polyuria, polydipsia, confusion or memory problems, change in visual or auditory acuity.  Pertinent items noted in HPI and remainder of comprehensive ROS otherwise negative.  HOME MEDICATIONS: No current facility-administered medications on file prior to encounter.   Current Outpatient Prescriptions on File Prior to Encounter  Medication Sig Dispense Refill  . acetaminophen (TYLENOL) 500 MG tablet Take 500 mg by mouth every 6 (six) hours as needed for moderate pain.    Marland Kitchen aspirin EC 81 MG tablet Take 1 tablet (81 mg total) by mouth daily. 90 tablet 3  . atorvastatin (LIPITOR) 40 MG tablet Take 1 tablet (40 mg total) by mouth daily at 6 PM. 90 tablet 3  . carvedilol (COREG) 3.125 MG tablet Take 1 tablet (3.125 mg total) by mouth 2 (two) times daily. 180 tablet 3  . Eluxadoline (VIBERZI) 75 MG TABS Take 75 mg by mouth daily.    . ferrous sulfate 325 (65 FE) MG EC tablet Take twice a day with orange juice. 120 tablet 3  . multivitamin-iron-minerals-folic acid (CENTRUM) chewable tablet Chew 1 tablet by mouth daily.    . ramipril (ALTACE) 2.5 MG capsule Take 1 capsule (2.5 mg total) by mouth daily. 90 capsule 3  . ranolazine (RANEXA) 1000 MG SR  tablet Take 1 tablet (1,000 mg total) by mouth 2 (two) times daily. 180 tablet 3  . tamsulosin (FLOMAX) 0.4 MG CAPS capsule Take 0.4 mg by mouth daily as needed (BPH).    . vitamin B-12 (CYANOCOBALAMIN) 1000 MCG tablet Take 1,000 mcg by mouth daily.    . nitroGLYCERIN (NITROSTAT) 0.4 MG SL tablet Place 1 tablet (0.4 mg total) under the tongue every 5 (five) minutes x 3 doses as needed for chest pain. 25 tablet 12     VITALS: Blood pressure 141/87, pulse 100, temperature 97.6 F (36.4 C), temperature source Oral, resp. rate 25, height 5\' 8"  (1.727 m), weight 100.608 kg (221 lb 12.8 oz), SpO2 95 %.  PHYSICAL EXAM:  General: Alert, oriented x3, no distress Head: no evidence of trauma,  PERRL, EOMI, no exophtalmos or lid lag, no myxedema, no xanthelasma; normal ears, nose and oropharynx Neck: normal jugular venous pulsations and no hepatojugular reflux; brisk carotid pulses without delay and no carotid bruits Chest: clear to auscultation, no signs of consolidation by percussion or palpation, normal fremitus, symmetrical and full respiratory excursions Cardiovascular: normal position and quality of the apical impulse, regular rhythm, normal first heart sound and normal second heart sound, no rubs or [id number] holosystolic murmur heard best at the apex radiating deep into the axilla but also towards the base  Abdomen: no tenderness or distention, no masses by palpation, no abnormal pulsatility or arterial bruits, normal bowel sounds, no hepatosplenomegaly Extremities: no clubbing, cyanosis; Homan's negative;   No edema; 2+ radial, ulnar and brachial pulses bilaterally; 2+ right femoral, posterior tibial and dorsalis pedis pulses; 2+ left femoral, posterior tibial and dorsalis pedis pulses; no subclavian or femoral bruits Neurological: grossly nonfocal   LABS  CBC  Recent Labs  01/20/16 1236  WBC 13.2*  HGB 11.6*  HCT 36.0*  MCV 93.3  PLT 99991111   Basic Metabolic Panel  Recent Labs  01/20/16 1236  NA 141  K 5.1  CL 106  CO2 21*  GLUCOSE 220*  BUN 25*  CREATININE 2.04*  CALCIUM 9.6    IMAGING: Dg Chest 2 View  01/20/2016  CLINICAL DATA:  Left-sided chest pain shortness of breath for 2 days EXAM: CHEST  2 VIEW COMPARISON:  04/24/2015 FINDINGS: Cardiac shadow is at the upper limits of normal in size. Some platelike atelectasis is noted in the left lung base which may be related to scarring. Mild vascular congestion is again seen with changes of interstitial edema. No focal confluent infiltrate is seen. IMPRESSION: Mild CHF with interstitial edema. Electronically Signed   By: Inez Catalina M.D.   On: 01/20/2016 12:54    ECG: normal sinus rhythm, intraventricular  conduction delay almost a complete left bundle branch block, left axis deviation, widespread ST segment depression T-wave inversion   TELEMETRY:  sinus tachycardia   IMPRESSION:  1. Acute myocardial infarction - extensive, with delayed presentation 2. New LBBB 3. Acute on chronic combined systolic and diastolic heart failure with pulmonary edema 4. Mitral insufficiency, possibly severe 5. Reported hemoptysis 6. Chronic kidney disease stage 3-4, at baseline 7. Mild anemia, Fe deficiency 8. History of paroxysmal atrial fibrillation 9. DM type 2 10. HTN  RECOMMENDATION: 1. He still has angina, suggesting there is still ischemic viable myocardium. Start IV NTG and heparin, will probably need coronary angiography, but would first like to clarify whether he is truly having significant hemoptysis. Need to evaluate this before we commit to PCI/dual antiplatelet therapy 2. Unsure of distribution of current infarction,  suspect LAD artery related 3. Start vasodilators. Cautious diuresis with CKD and planned angiogram 4. Exam is worrisome for acute MR as cause of CHF, maybe also cause of hemoptysis? STAT echo ordered 5. Will ask for pulmonary consultation, especially with use of IV heparin 6. Hold ACEi pre cath and minimize use of nephrotoxic agents 7. Monitor H/H  8. Off Eliquis due to epistaxis and anemia 9. Last A1c 6.3% 10. Meds recently reduced for hypotension  Time Spent Directly with Patient: 60 minutes  Sanda Klein, MD, [facility] [phone] office [phone] pager   01/20/2016, 2:18 PM

## 2016-01-20 NOTE — Progress Notes (Signed)
CRITICAL VALUE ALERT  Critical value received:  Troponin >0.65  Date of notification:  01/20/2016  Time of notification:  [id number]  Critical value read back:Yes.    Nurse who received alert:  Rosebud Poles RN  MD notified (1st page):  Croitoru   Time of first page:  1710  MD at bedside 1725

## 2016-01-20 NOTE — Consult Note (Signed)
Name: [name] [name] MRN: [id number] DOB: [birth year]    ADMISSION DATE:  01/20/2016 CONSULTATION DATE:  2/15  REFERRING MD :  Croitoru   CHIEF COMPLAINT:  Hemoptysis   BRIEF PATIENT DESCRIPTION:  6 yom w/ extensive cardiac disease: ICM EF 40-45%, CAD, PAF. Admitted 2/15 w/ NSTEMI, dyspnea, bilateral infiltrates & hemoptysis. PCCM was asked to see RE: hemoptysis   SIGNIFICANT EVENTS    STUDIES:  CT chest 2/15>>>   HISTORY OF PRESENT ILLNESS:   This is a pleasant 82 year old male w/ sig h/o extensive CAD (prior STEMI of L Circ 2015; ICM w/ EF 40-50%), Chronic combined systolic and diastolic HF, PAF, HTN and stage III-IV CKD. Was in usual state of health until ~ 2 weeks prior to presenting to the ED on 2/15. He reported that over the last 2 weeks had had increased exertional dyspnea and episodic chest pain which would occur w/ exertion and would go away with rest. He has had no new HA, epistaxis, chest congestion or fever. His wife had been sick w/ URI but he has not had similar symptoms. During the afternoon of 2/14 he noted much worse chest discomfort while feeding his pets and this was also associated w/ significant dyspnea. These symptoms did not readily resolve w/ rest and he also coughed up about a 1/2 teaspoon of blood twice that evening. During the night he continued to have resting dyspnea as well as + orthopnea. Since the am he has had three more episodes of coughing up blood w/ similar amount and character. On arrival to the ER he had an EKG which showed almost complete LBBB and wide spread T wave inversion, his Trop I was >35 and his CXR showed bilateral infiltrates. Because of this PCCM was asked to see.  >recently stopped antihypertensives, diuretic and eliquis   PAST MEDICAL HISTORY :   has a past medical history of HLD (hyperlipidemia); NSTEMI (non-ST elevated myocardial infarction) ([place]); Type II diabetes mellitus ([place]); Paroxysmal atrial fibrillation ([place]); Chronic combined  systolic and diastolic CHF (congestive heart failure) ([place]); CAD (coronary artery disease); Pulmonary edema; Renal insufficiency; Obesity; Hypertension; and History of echocardiogram.  has past surgical history that includes Appendectomy; Total knee arthroplasty (Left, 2013); Tonsillectomy; Joint replacement; Cardiac catheterization (11/24/2014); left heart catheterization with coronary angiogram (N/A, 11/24/2014); Esophagogastroduodenoscopy (egd) with propofol (N/A, 07/16/2015); and Colonoscopy with propofol (N/A, 07/16/2015). Prior to Admission medications   Medication Sig Start Date End Date Taking? Authorizing Provider  acetaminophen (TYLENOL) 500 MG tablet Take 500 mg by mouth every 6 (six) hours as needed for moderate pain.   Yes Historical Provider, MD  aspirin EC 81 MG tablet Take 1 tablet (81 mg total) by mouth daily. 12/10/15  Yes Dorothy Spark, MD  atorvastatin (LIPITOR) 40 MG tablet Take 1 tablet (40 mg total) by mouth daily at 6 PM. 04/29/15  Yes Scott T Kathlen Mody, PA-C  carvedilol (COREG) 3.125 MG tablet Take 1 tablet (3.125 mg total) by mouth 2 (two) times daily. 12/25/15  Yes Jerline Pain, MD  Eluxadoline (VIBERZI) 75 MG TABS Take 75 mg by mouth daily.   Yes Historical Provider, MD  ferrous sulfate 325 (65 FE) MG EC tablet Take twice a day with orange juice. 08/18/15  Yes Wyatt Portela, MD  multivitamin-iron-minerals-folic acid (CENTRUM) chewable tablet Chew 1 tablet by mouth daily.   Yes Historical Provider, MD  ramipril (ALTACE) 2.5 MG capsule Take 1 capsule (2.5 mg total) by mouth daily. 12/25/15  Yes Mark C  Marlou Porch, MD  ranolazine (RANEXA) 1000 MG SR tablet Take 1 tablet (1,000 mg total) by mouth 2 (two) times daily. 12/10/15  Yes Dorothy Spark, MD  tamsulosin (FLOMAX) 0.4 MG CAPS capsule Take 0.4 mg by mouth daily as needed (BPH).   Yes Historical Provider, MD  vitamin B-12 (CYANOCOBALAMIN) 1000 MCG tablet Take 1,000 mcg by mouth daily.   Yes Historical Provider, MD  nitroGLYCERIN  (NITROSTAT) 0.4 MG SL tablet Place 1 tablet (0.4 mg total) under the tongue every 5 (five) minutes x 3 doses as needed for chest pain. 11/26/14   Eileen Stanford, PA-C   Allergies  Allergen Reactions  . Lisinopril     cough    FAMILY HISTORY:  family history includes Cancer (age of onset: 35) in his father; Heart attack in his brother; Heart disease in his brother, sister, and sister; Stroke in his mother. There is no history of Hypertension. SOCIAL HISTORY:  reports that he has never smoked. He has never used smokeless tobacco. He reports that he drinks about 8.4 oz of alcohol per week. He reports that he does not use illicit drugs.  REVIEW OF SYSTEMS:   Constitutional: Negative for fever, chills, weight loss, malaise/fatigue and diaphoresis.  HENT: Negative for hearing loss, ear pain, nosebleeds, congestion, sore throat, neck pain, tinnitus and ear discharge.   Eyes: Negative for blurred vision, double vision, photophobia, pain, discharge and redness.  Respiratory: + hemoptysis teaspoon at a time, + shortness of breath, wheezing and stridor.   Cardiovascular: +chest pain, palpitations, + orthopnea, claudication, + leg swelling and PND.  Gastrointestinal: Negative for heartburn, nausea, vomiting, abdominal pain, diarrhea, constipation, blood in stool and melena.  Genitourinary: Negative for dysuria, urgency, frequency, hematuria and flank pain.  Musculoskeletal: Negative for myalgias, back pain, joint pain and falls.  Skin: Negative for itching and rash.  Neurological: Negative for dizziness, tingling, tremors, sensory change, speech change, focal weakness, seizures, loss of consciousness, weakness and headaches.  Endo/Heme/Allergies: Negative for environmental allergies and polydipsia. Does not bruise/bleed easily.  SUBJECTIVE: no distress on NTG gtt   VITAL SIGNS: Temp:  [97.6 F (36.4 C)] 97.6 F (36.4 C) (02/15 1222) Pulse Rate:  [95-108] 99 (02/15 1500) Resp:  [16-26] 21  (02/15 1500) BP: (134-152)/(69-87) 140/84 mmHg (02/15 1500) SpO2:  [93 %-95 %] 93 % (02/15 1500) Weight:  [221 lb 12.8 oz (100.608 kg)] 221 lb 12.8 oz (100.608 kg) (02/15 1222) 2 liters  PHYSICAL EXAMINATION: General:  82 year old male, resting comfortably in bed. No distress. Appears younger than stated age Neuro:  Awake, no focal def, oriented x 3 HEENT:  MMM, no JVD, no adenopathy  Cardiovascular:  rrr w/out MRG  Lungs:  Equal, crackles both bases. No accessory muscle use  Abdomen:  Soft, not tender + bowel sounds Musculoskeletal:  Equal strength and bulk  Skin:  Warm and intact brisk CR    Recent Labs Lab 01/20/16 1236  NA 141  K 5.1  CL 106  CO2 21*  BUN 25*  CREATININE 2.04*  GLUCOSE 220*    Recent Labs Lab 01/20/16 1236  HGB 11.6*  HCT 36.0*  WBC 13.2*  PLT 206   Dg Chest 2 View  01/20/2016  CLINICAL DATA:  Left-sided chest pain shortness of breath for 2 days EXAM: CHEST  2 VIEW COMPARISON:  04/24/2015 FINDINGS: Cardiac shadow is at the upper limits of normal in size. Some platelike atelectasis is noted in the left lung base which may be related to  scarring. Mild vascular congestion is again seen with changes of interstitial edema. No focal confluent infiltrate is seen. IMPRESSION: Mild CHF with interstitial edema. Electronically Signed   By: Inez Catalina M.D.   On: 01/20/2016 12:54    ASSESSMENT / PLAN: NSTEMI w/ known underlying ICM Plan Per cards  Hemoptysis in the setting of bilateral nodular pulmonary infiltrates.  ->d-dx: infectious vs vasculitis vs pulmonary edema. Would favor edema given the progression of his symptoms after stopping recent antihypertensives and diuretics. Leading to scenario of pulmonary edema or fractured capillary syndrome resulting in the hemoptysis. Less likely vasculitis but CXR findings are nodular and does have underlying renal involvement.  Plan CT chest Will send auto-immune markers PCTs Cont Ntg gtt May need FOB if we are  to consider anti-platelet therapy   CKD Plan Renal dose meds  Anemia of chronic disease Plan Trend cbc on heparin   DM Plan ssi   Erick Colace ACNP-BC [place] Pager # [phone] OR # [phone] if no answer  Attending Note:  82 year old male with history of PAF who was on xarelto that was stopped due to renal failure. He also stopped his lasix and has been progressively short of breath. He started having hemoptysis 30 hours ago with 3-4 episodes of coin size bright red blood. He comes in today with an acute MI with troponins >35. Cardiology evaluating the patient and feels he needs a cath but given the need for use of plavix PCCM was involved. On exam, bibasilar crackles noted. I reviewed CXR myself with radiology, interstitial edema noted as well as infiltrate that could be consistent with DAH R>L. Case discussed with Card-MD and radiologist.  Hemoptysis: likely to valvular disease, however, with the CXR finding [place] is a concern.  - Auto-immune work up. - Chest CT without contrast to determine extent of infiltrate. - No bronch for now.  Pulmonary edema: due to AMI. - Hold diureses (will need some but not with cath scheduled for tomorrow). - O2. - Will eventually need diurese.  Hypoxemia: due to pulmonary edema most likely. - Supplemental O2 for sat of 92-95%. - Diureses as able. - May require an ambulatory desaturation for home O2.  AMI: - Cards to take to the cath lab in AM.  Patient seen and examined, agree with above note. I dictated the care and orders written for this patient under my direction.  Rush Farmer, MD [phone]  01/20/2016, 3:19 PM

## 2016-01-21 DIAGNOSIS — I5043 Acute on chronic combined systolic (congestive) and diastolic (congestive) heart failure: Secondary | ICD-10-CM

## 2016-01-21 DIAGNOSIS — I2119 ST elevation (STEMI) myocardial infarction involving other coronary artery of inferior wall: Secondary | ICD-10-CM | POA: Diagnosis not present

## 2016-01-21 DIAGNOSIS — I340 Nonrheumatic mitral (valve) insufficiency: Secondary | ICD-10-CM

## 2016-01-21 DIAGNOSIS — E1122 Type 2 diabetes mellitus with diabetic chronic kidney disease: Secondary | ICD-10-CM | POA: Diagnosis not present

## 2016-01-21 DIAGNOSIS — I480 Paroxysmal atrial fibrillation: Secondary | ICD-10-CM

## 2016-01-21 DIAGNOSIS — R0902 Hypoxemia: Secondary | ICD-10-CM

## 2016-01-21 DIAGNOSIS — N184 Chronic kidney disease, stage 4 (severe): Secondary | ICD-10-CM | POA: Insufficient documentation

## 2016-01-21 DIAGNOSIS — N189 Chronic kidney disease, unspecified: Secondary | ICD-10-CM

## 2016-01-21 DIAGNOSIS — N289 Disorder of kidney and ureter, unspecified: Secondary | ICD-10-CM

## 2016-01-21 LAB — BASIC METABOLIC PANEL
Anion gap: 10 (ref 5–15)
BUN: 24 mg/dL — AB (ref 6–20)
CHLORIDE: 107 mmol/L (ref 101–111)
CO2: 23 mmol/L (ref 22–32)
CREATININE: 1.7 mg/dL — AB (ref 0.61–1.24)
Calcium: 9.2 mg/dL (ref 8.9–10.3)
GFR calc Af Amer: 41 mL/min — ABNORMAL LOW (ref >60)
GFR calc non Af Amer: 35 mL/min — ABNORMAL LOW (ref >60)
GLUCOSE: 155 mg/dL — AB (ref 65–99)
Potassium: 4.6 mmol/L (ref 3.5–5.1)
SODIUM: 140 mmol/L (ref 135–145)

## 2016-01-21 LAB — ANCA TITERS: C-ANCA: <1:20 {titer}

## 2016-01-21 LAB — LIPID PANEL
CHOL/HDL RATIO: 1.7 ratio
Cholesterol: 87 mg/dL (ref 0–200)
HDL: 50 mg/dL (ref >40)
LDL CALC: 27 mg/dL (ref 0–99)
Triglycerides: 50 mg/dL (ref <150)
VLDL: 10 mg/dL (ref 0–40)

## 2016-01-21 LAB — CBC
HCT: 32.3 % — ABNORMAL LOW (ref 39.0–52.0)
HEMOGLOBIN: 10.2 g/dL — AB (ref 13.0–17.0)
MCH: 29.3 pg (ref 26.0–34.0)
MCHC: 31.6 g/dL (ref 30.0–36.0)
MCV: 92.8 fL (ref 78.0–100.0)
PLATELETS: 176 10*3/uL (ref 150–400)
RBC: 3.48 MIL/uL — ABNORMAL LOW (ref 4.22–5.81)
RDW: 14.6 % (ref 11.5–15.5)
WBC: 11.7 10*3/uL — ABNORMAL HIGH (ref 4.0–10.5)

## 2016-01-21 LAB — HEPARIN LEVEL (UNFRACTIONATED)
HEPARIN UNFRACTIONATED: 0.71 [IU]/mL — AB (ref 0.30–0.70)
HEPARIN UNFRACTIONATED: 0.41 [IU]/mL (ref 0.30–0.70)
HEPARIN UNFRACTIONATED: 0.36 [IU]/mL (ref 0.30–0.70)

## 2016-01-21 LAB — TROPONIN I
TROPONIN I: 26.28 ng/mL — AB (ref <0.031)
Troponin I: >65 ng/mL (ref <0.031)

## 2016-01-21 LAB — MPO/PR-3 (ANCA) ANTIBODIES: Myeloperoxidase Abs: <9 U/mL (ref 0.0–9.0)

## 2016-01-21 LAB — ANTINUCLEAR ANTIBODIES, IFA: ANTINUCLEAR ANTIBODIES, IFA: NEGATIVE

## 2016-01-21 LAB — PROTIME-INR
INR: 1.36 (ref 0.00–1.49)
PROTHROMBIN TIME: 16.9 s — AB (ref 11.6–15.2)

## 2016-01-21 LAB — PROCALCITONIN: Procalcitonin: 0.95 ng/mL

## 2016-01-21 MED ORDER — POLYETHYLENE GLYCOL 3350 17 G PO PACK
17.0000 g | PACK | Freq: Every day | ORAL | Status: DC | PRN
  Administered 2016-01-21: 17 g via ORAL
  Filled 2016-01-21: qty 1

## 2016-01-21 MED ORDER — RAMIPRIL 2.5 MG PO CAPS
2.5000 mg | ORAL_CAPSULE | Freq: Every day | ORAL | Status: DC
  Administered 2016-01-21: 2.5 mg via ORAL
  Filled 2016-01-21 (×2): qty 1

## 2016-01-21 MED ORDER — FUROSEMIDE 10 MG/ML IJ SOLN
40.0000 mg | Freq: Once | INTRAMUSCULAR | Status: AC
  Administered 2016-01-21: 40 mg via INTRAVENOUS
  Filled 2016-01-21: qty 4

## 2016-01-21 NOTE — Progress Notes (Signed)
SUBJECTIVE: denies any complaint other than constipation. No sob or cp currently. Had last episode of hempotysis last night, none this morning. Had 1/2 tbsp amount few times yesterday.   BP 132/78 mmHg  Pulse 90  Temp(Src) 98 F (36.7 C) (Oral)  Resp 21  Ht _0  (1.727 m)  Wt 98.6 kg (217 lb 6 oz)  BMI 33.06 kg/m2  SpO2 95%  Intake/Output Summary (Last 24 hours) at 01/21/16 0744 Last data filed at 01/21/16 0600  Gross per 24 hour  Intake  353.4 ml  Output      0 ml  Net  353.4 ml    PHYSICAL EXAM General: Well developed, well nourished, in no acute distress. Alert and oriented x 3.  Psych:  Good affect, responds appropriately Neck: No JVD. No masses noted.  Lungs: bibasilar crackles.  Heart: RRR with no murmurs noted. Abdomen: Bowel sounds are present. Soft, non-tender.  Extremities: trace edema b/l LE's.   LABS: Basic Metabolic Panel:  Recent Labs  01/20/16 1236 01/21/16 0420  NA 141 140  K 5.1 4.6  CL 106 107  CO2 21* 23  GLUCOSE 220* 155*  BUN 25* 24*  CREATININE 2.04* 1.70*  CALCIUM 9.6 9.2   CBC:  Recent Labs  01/20/16 1236 01/21/16 0420  WBC 13.2* 11.7*  HGB 11.6* 10.2*  HCT 36.0* 32.3*  MCV 93.3 92.8  PLT 206 176   Cardiac Enzymes:  Recent Labs  01/20/16 1605 01/20/16 2237 01/21/16 0420  TROPONINI >65.00* >65.00* >65.00*   Fasting Lipid Panel:  Recent Labs  01/21/16 0420  CHOL 87  HDL 50  LDLCALC 27  TRIG 50  CHOLHDL 1.7    Current Meds: . antiseptic oral rinse  7 mL Mouth Rinse BID  . aspirin EC  81 mg Oral Daily  . atorvastatin  40 mg Oral q1800  . carvedilol  3.125 mg Oral BID  . ferrous sulfate  325 mg Oral BID WC  . multivitamin with minerals  1 tablet Oral Daily  . ranolazine  1,000 mg Oral BID  . vitamin B-12  1,000 mcg Oral Daily     ASSESSMENT AND PLAN:  Active Problems:   Acute MI ([place])   82 yo male with hx of ischemic cardiomyopathy, pAfib, knowns CAD was being medically managed comes in with  chest pain with elevated troponin and new LBBB.  He had  with Cath on 12/15 due to NSTEMI showing CTO of LCx and OM2 branch , with R to Left and Left to left collaterals filling OM2 and distal circ system, mod LAD disease. Plan that time was to do medical therapy and do CTO PCI to mid circ if he has angina. He was later thought to be poor candidate for cath 2/2 to his CKD stage 4. His eliquis (for afib) was stopped due to anemia and bleeding.   Currently he had 2 days of hemoptysis, CT chest showed R pneumonia vs pulm hemorrhage. His bleeding has stopped since last night. No fever or other signs of infection.  Acute MI - With trop >65, new incomplete LBBB with known LVH and repol. On Asa and Heparin. Plan is to take him to cath lab, however, the timing is not clear since he had hemoptysis. We don't want to commit him to prolonged DAPT with on going hemoptysis. His last episode was last night, may consider delaying cath until tomorrow and observe for further events. - cont heparin, asa, lipitor, coreg, ranexa, lipitor nitro gtt.  Hemoptysis -  echo showed moderate MR, not sure if that caused hemoptysis. CT chest shows right sided peribronchial infiltrate which could be PNA vs hemorrhage. CRP and ESR both elevated, ANCA pending. PCCM following. Monitor for further events.  Acute CHF with known combined cardiomyopathy - previous EF was 40-45% on 11/2014 ECHO. On repeat this admission it's 35-40% with diffuse hypokinesis + basal and mid inferior akinesis.  - doesn't seem overtly volume overloaded on exam. Has trace edema and some crackles on lung exam. Subjectively not having SOB. Hold off lasix for now.   PAfib - now in NSR. Was on eliquis but was d/ced due to anemia. Continue ASA and heparin for now.   CKD 3-4 - b/l crt around 1.6, now 1.70. Avoid nephrotoxins, hold Ramipril.   Normocytic anemia - b/l 10-11. Now in 10's. Likely 2/2 to CKD. Iron panel showed high ferritin with normal  sat%.   Ahmed, Chesley Mires  2/16/20177:44 AM  I have seen and examined the patient along with Dellia Nims, MD.  I have reviewed the chart, notes and new data.  I agree with MD's note.  Key new complaints: a little more dyspnea, no angina; had no hemoptysis overnight, but did have a little just a few minutes ago - slightly less bright red, more rusty color Key examination changes: clear lungs on exam, a little tachypneic Key new findings / data: CT looks very atypical for CHF. Infiltrates are not dependent and are dominantly on the R, air bronchograms are seen. On my review of his echo the mitral regurgitation is at least moderate to severe and is probably due to ischemic tethering from infero-posterior akinesis. Creatinine close to baseline. Hgb stable  PLAN:  1. Acute inferior MI with delayed presentation, new LBBB Hold off angiography - the bulk of evidence suggests he has completed a large inferior infarction with further extension into the inferolateral wall (the bulk of collateral flow to the LCX comes from the RCA). Unlikely that there is a lot of viable myocardium and risk of pulmonary hemorrhage outweighs the benefit. Hold off potent antiplatelets until pulmonary situation is clarified. Continue heparin IV another 24-48 hours as long as bleeding is not severe  2. Acute combined systolic and diastolic heart failure One dose of diuretic today. Watch renal function. Restart ACEi since will not plan to cath. Keep on NTG for vasodilation/unloading.  3. Mitral insufficiency May be severe and is a poor prognostic marker.  4. Pulmonary hemorrhage, unclear etiology Elevated nonspecific markers of inflammation (ESR, CRP) could also be secondary to large MI. More specific markers pending.  5. Acute on chronic (stage 3-4) renal insufficiency Improved, close to baseline. Resume ACEi. Will try to avoid angiography.  6. Multifactorial anemia with element of iron deficiency as well as renal  insufficiency. Also has MGUS.  7. History of paroxysmal atrial fibrillation - hold off long term anticoagulants due to hemoptysis and history of GI bleeding  8. DM type 2   Sanda Klein, MD, [facility] [phone] 01/21/2016, 9:13 AM

## 2016-01-21 NOTE — Progress Notes (Addendum)
ANTICOAGULATION CONSULT NOTE - Follow Up Consult  Pharmacy Consult for Heparin  Indication: chest pain/ACS  Allergies  Allergen Reactions  . Lisinopril     cough   Patient Measurements: Height: 5\' 8"  (172.7 cm) Weight: 221 lb 12.8 oz (100.608 kg) IBW/kg (Calculated) : 68.4  Vital Signs: Temp: 98 F (36.7 C) (02/16 0400) Temp Source: Oral (02/16 0000) BP: 124/79 mmHg (02/16 0500) Pulse Rate: 85 (02/16 0500)  Labs:  Recent Labs  01/20/16 1236 01/20/16 1505 01/20/16 1605 01/20/16 2237 01/21/16 0420  HGB 11.6*  --   --   --  10.2*  HCT 36.0*  --   --   --  32.3*  PLT 206  --   --   --  176  HEPARINUNFRC  --  1.42*  --  1.00* 0.71*  CREATININE 2.04*  --   --   --  1.70*  TROPONINI  --   --  >65.00* >65.00* >65.00*    Estimated Creatinine Clearance: 37.9 mL/min (by C-G formula based on Cr of 1.7).  Assessment: Heparin for acute MI, markedly elevated troponin, pt having some mild/stable hemoptysis  Goal of Therapy:  Heparin level 0.3-0.7 units/ml Monitor platelets by anticoagulation protocol: Yes   Plan:  -Decrease heparin drip to 900 units/hr -1200 HL  [name] [name], [name] [name] 01/21/2016,5:12 AM   Addendum: Heparin level is now therapeutic at 0.41 after rate decrease. Patient continues to have hemoptysis (1/2 tbsp) but cardiology wanting to continue heparin. Planning another 24-48 hours of heparin and likely no cath since risks outweigh benefit at this point.  Plan: 1) Continue heparin at 900 units/hr 2) Check 8 hour heparin level to confirm 3) Follow up hemoptysis  Nena Jordan, PharmD, BCPS 01/21/2016, 1:19 PM

## 2016-01-21 NOTE — Progress Notes (Signed)
Name: [name] [name] MRN: [id number] DOB: Jan 14, 1930    ADMISSION DATE:  01/20/2016 CONSULTATION DATE:  2/15  REFERRING MD :  Croitoru   CHIEF COMPLAINT:  Hemoptysis   BRIEF PATIENT DESCRIPTION:  4 yom w/ extensive cardiac disease: ICM EF 40-45%, CAD, PAF. Admitted 2/15 w/ NSTEMI, dyspnea, bilateral infiltrates & hemoptysis. PCCM was asked to see RE: hemoptysis   SIGNIFICANT EVENTS    STUDIES:  CT chest 2/15>>>   HISTORY OF PRESENT ILLNESS:   This is a pleasant 82 year old male w/ sig h/o extensive CAD (prior STEMI of L Circ 2015; ICM w/ EF 40-50%), Chronic combined systolic and diastolic HF, PAF, HTN and stage III-IV CKD. Was in usual state of health until ~ 2 weeks prior to presenting to the ED on 2/15. He reported that over the last 2 weeks had had increased exertional dyspnea and episodic chest pain which would occur w/ exertion and would go away with rest. He has had no new HA, epistaxis, chest congestion or fever. His wife had been sick w/ URI but he has not had similar symptoms. During the afternoon of 2/14 he noted much worse chest discomfort while feeding his pets and this was also associated w/ significant dyspnea. These symptoms did not readily resolve w/ rest and he also coughed up about a 1/2 teaspoon of blood twice that evening. During the night he continued to have resting dyspnea as well as + orthopnea. Since the am he has had three more episodes of coughing up blood w/ similar amount and character. On arrival to the ER he had an EKG which showed almost complete LBBB and wide spread T wave inversion, his Trop I was >35 and his CXR showed bilateral infiltrates. Because of this PCCM was asked to see.  >recently stopped antihypertensives, diuretic and eliquis   PAST MEDICAL HISTORY :   has a past medical history of HLD (hyperlipidemia); NSTEMI (non-ST elevated myocardial infarction) ([place]); Type II diabetes mellitus ([place]); Paroxysmal atrial fibrillation ([place]); Chronic combined  systolic and diastolic CHF (congestive heart failure) ([place]); CAD (coronary artery disease); Pulmonary edema; Renal insufficiency; Obesity; Hypertension; and History of echocardiogram.  has past surgical history that includes Appendectomy; Total knee arthroplasty (Left, 2013); Tonsillectomy; Joint replacement; Cardiac catheterization (11/24/2014); left heart catheterization with coronary angiogram (N/A, 11/24/2014); Esophagogastroduodenoscopy (egd) with propofol (N/A, 07/16/2015); and Colonoscopy with propofol (N/A, 07/16/2015). Prior to Admission medications   Medication Sig Start Date End Date Taking? Authorizing Provider  acetaminophen (TYLENOL) 500 MG tablet Take 500 mg by mouth every 6 (six) hours as needed for moderate pain.   Yes Historical Provider, MD  aspirin EC 81 MG tablet Take 1 tablet (81 mg total) by mouth daily. 12/10/15  Yes Dorothy Spark, MD  atorvastatin (LIPITOR) 40 MG tablet Take 1 tablet (40 mg total) by mouth daily at 6 PM. 04/29/15  Yes Scott T Kathlen Mody, PA-C  carvedilol (COREG) 3.125 MG tablet Take 1 tablet (3.125 mg total) by mouth 2 (two) times daily. 12/25/15  Yes Jerline Pain, MD  Eluxadoline (VIBERZI) 75 MG TABS Take 75 mg by mouth daily.   Yes Historical Provider, MD  ferrous sulfate 325 (65 FE) MG EC tablet Take twice a day with orange juice. 08/18/15  Yes Wyatt Portela, MD  multivitamin-iron-minerals-folic acid (CENTRUM) chewable tablet Chew 1 tablet by mouth daily.   Yes Historical Provider, MD  ramipril (ALTACE) 2.5 MG capsule Take 1 capsule (2.5 mg total) by mouth daily. 12/25/15  Yes Mark C  Marlou Porch, MD  ranolazine (RANEXA) 1000 MG SR tablet Take 1 tablet (1,000 mg total) by mouth 2 (two) times daily. 12/10/15  Yes Dorothy Spark, MD  tamsulosin (FLOMAX) 0.4 MG CAPS capsule Take 0.4 mg by mouth daily as needed (BPH).   Yes Historical Provider, MD  vitamin B-12 (CYANOCOBALAMIN) 1000 MCG tablet Take 1,000 mcg by mouth daily.   Yes Historical Provider, MD  nitroGLYCERIN  (NITROSTAT) 0.4 MG SL tablet Place 1 tablet (0.4 mg total) under the tongue every 5 (five) minutes x 3 doses as needed for chest pain. 11/26/14   Eileen Stanford, PA-C   Allergies  Allergen Reactions  . Lisinopril     cough    FAMILY HISTORY:  family history includes Cancer (age of onset: 24) in his father; Heart attack in his brother; Heart disease in his brother, sister, and sister; Stroke in his mother. There is no history of Hypertension. SOCIAL HISTORY:  reports that he has never smoked. He has never used smokeless tobacco. He reports that he drinks about 8.4 oz of alcohol per week. He reports that he does not use illicit drugs.  REVIEW OF SYSTEMS:   Constitutional: Negative for fever, chills, weight loss, malaise/fatigue and diaphoresis.  HENT: Negative for hearing loss, ear pain, nosebleeds, congestion, sore throat, neck pain, tinnitus and ear discharge.   Eyes: Negative for blurred vision, double vision, photophobia, pain, discharge and redness.  Respiratory: + hemoptysis teaspoon at a time, + shortness of breath, wheezing and stridor.   Cardiovascular: +chest pain, palpitations, + orthopnea, claudication, + leg swelling and PND.  Gastrointestinal: Negative for heartburn, nausea, vomiting, abdominal pain, diarrhea, constipation, blood in stool and melena.  Genitourinary: Negative for dysuria, urgency, frequency, hematuria and flank pain.  Musculoskeletal: Negative for myalgias, back pain, joint pain and falls.  Skin: Negative for itching and rash.  Neurological: Negative for dizziness, tingling, tremors, sensory change, speech change, focal weakness, seizures, loss of consciousness, weakness and headaches.  Endo/Heme/Allergies: Negative for environmental allergies and polydipsia. Does not bruise/bleed easily.  SUBJECTIVE: no distress on NTG gtt   VITAL SIGNS: Temp:  [97.6 F (36.4 C)-98.3 F (36.8 C)] 98 F (36.7 C) (02/16 0821) Pulse Rate:  [85-108] 97 (02/16 0938) Resp:   [10-27] 21 (02/16 0600) BP: (124-152)/(69-88) 136/84 mmHg (02/16 0938) SpO2:  [88 %-96 %] 95 % (02/16 0600) Weight:  [98.6 kg (217 lb 6 oz)-100.608 kg (221 lb 12.8 oz)] 98.6 kg (217 lb 6 oz) (02/16 0500) 2 liters  PHYSICAL EXAMINATION: General:  82 year old male, resting comfortably in bed. No distress. Appears younger than stated age Neuro:  Awake, no focal def, oriented x 3 HEENT:  MMM, no JVD, no adenopathy  Cardiovascular:  rrr w/out MRG  Lungs:  Equal, crackles both bases. No accessory muscle use  Abdomen:  Soft, not tender + bowel sounds Musculoskeletal:  Equal strength and bulk  Skin:  Warm and intact brisk CR    Recent Labs Lab 01/20/16 1236 01/21/16 0420  NA 141 140  K 5.1 4.6  CL 106 107  CO2 21* 23  BUN 25* 24*  CREATININE 2.04* 1.70*  GLUCOSE 220* 155*    Recent Labs Lab 01/20/16 1236 01/21/16 0420  HGB 11.6* 10.2*  HCT 36.0* 32.3*  WBC 13.2* 11.7*  PLT 206 176   Dg Chest 2 View  01/20/2016  CLINICAL DATA:  Left-sided chest pain shortness of breath for 2 days EXAM: CHEST  2 VIEW COMPARISON:  04/24/2015 FINDINGS: Cardiac shadow is  at the upper limits of normal in size. Some platelike atelectasis is noted in the left lung base which may be related to scarring. Mild vascular congestion is again seen with changes of interstitial edema. No focal confluent infiltrate is seen. IMPRESSION: Mild CHF with interstitial edema. Electronically Signed   By: Inez Catalina M.D.   On: 01/20/2016 12:54   Ct Chest Wo Contrast  01/20/2016  CLINICAL DATA:  Pleural effusion on chest x-ray. EXAM: CT CHEST WITHOUT CONTRAST TECHNIQUE: Multidetector CT imaging of the chest was performed following the standard protocol without IV contrast. COMPARISON:  Chest x-ray earlier the same day. FINDINGS: Mediastinum / Lymph Nodes: There is no axillary lymphadenopathy. 9 mm right thyroid nodule noted. No mediastinal lymphadenopathy. Calcified lymph nodes are seen in the subcarinal station and right  hilum. The esophagus has normal imaging features. Heart size is upper normal. Coronary artery calcification is noted. Lungs / Pleura: There is central ground-glass attenuation involving the right upper, middle, and lower lobes with a central predominance. Scattered areas of interlobular septal thickening associated. There is relative sparing at the right lung base. Mild dependent atelectasis or airspace disease is seen in the left lower lobe. Small bilateral pleural effusions are noted. Upper Abdomen: Bilateral adrenal nodules measure in the 1-2 cm size range. These are incompletely visualized but where seen show average attenuation less than 10 Hounsfield units, most consistent with adenomas. MSK / Soft Tissues: Bone windows reveal no worrisome lytic or sclerotic osseous lesions. IMPRESSION: Diffuse central alveolar opacity in the right lung with a somewhat peribronchovascular nodular distribution and peripheral/ basilar sparing. Imaging features are probably related to diffuse right-sided pneumonia given the asymmetry although asymmetric pulmonary edema cannot be entirely excluded. Pulmonary hemorrhage would also be a consideration. Small bilateral pleural effusions. Bilateral adrenal nodules most consistent with adenomas. Electronically Signed   By: Misty Stanley M.D.   On: 01/20/2016 17:16   I reviewed chest CT myself, infiltrate noted on the right lung, likely aspirated blood, do not suspect infection.  ASSESSMENT / PLAN: NSTEMI w/ known underlying ICM Plan Per cards  Hemoptysis in the setting of bilateral nodular pulmonary infiltrates.  ->d-dx: infectious vs vasculitis vs pulmonary edema. Would favor edema given the progression of his symptoms after stopping recent antihypertensives and diuretics. Leading to scenario of pulmonary edema or fractured capillary syndrome resulting in the hemoptysis. Less likely vasculitis but CXR findings are nodular and does have underlying renal involvement.  Plan CT  chest noted, likely aspirated blood. Auto-immune markers pending. PCT not drawn will reorder, if elevated will consider atypical pneumonia and treat accordingly. Cont Ntg gtt  CKD Plan Renal dose meds Holding off on cath until AM.  Anemia of chronic disease Plan Trend cbc on heparin   DM Plan CBG SSI   Discussed with bedside RN.  Rush Farmer, M.D. [facility]Critical [facility]. Pager: [phone]. After hours pager: [phone].  01/21/2016, 10:22 AM

## 2016-01-21 NOTE — Progress Notes (Signed)
ANTICOAGULATION CONSULT NOTE - Follow Up Consult  Pharmacy Consult for Heparin  Indication: chest pain/ACS  Allergies  Allergen Reactions  . Lisinopril     cough   Patient Measurements: Height: 5\' 8"  (172.7 cm) Weight: 217 lb 6 oz (98.6 kg) IBW/kg (Calculated) : 68.4  Vital Signs: Temp: 98 F (36.7 C) (02/16 1623) Temp Source: Oral (02/16 1623) BP: 128/75 mmHg (02/16 2000) Pulse Rate: 84 (02/16 2000)  Labs:  Recent Labs  01/20/16 1236  01/20/16 1605 01/20/16 2237 01/21/16 0420 01/21/16 1148 01/21/16 2030  HGB 11.6*  --   --   --  10.2*  --   --   HCT 36.0*  --   --   --  32.3*  --   --   PLT 206  --   --   --  176  --   --   LABPROT  --   --   --   --  16.9*  --   --   INR  --   --   --   --  1.36  --   --   HEPARINUNFRC  --   < >  --  1.00* 0.71* 0.41 0.36  CREATININE 2.04*  --   --   --  1.70*  --   --   TROPONINI  --   --  >65.00* >65.00* >65.00*  --   --   < > = values in this interval not displayed.  Estimated Creatinine Clearance: 37.5 mL/min (by C-G formula based on Cr of 1.7).   . heparin 900 Units/hr (01/21/16 0800)  . nitroGLYCERIN 10 mcg/min (01/21/16 0800)     Assessment: Heparin for acute MI, markedly elevated troponin, pt having some mild/stable hemoptysis  PM heparin level remains therapeutic at 0.36.  Per RN, 2-3 episodes of dime-sized hemoptysis earlier in the day, none currently.  Goal of Therapy:  Heparin level 0.3-0.7 units/ml Monitor platelets by anticoagulation protocol: Yes   Plan:  Continue IV heparin at current rate. F/u AM heparin level, CBC.  Uvaldo Rising, BCPS  Clinical Pharmacist Pager [phone]  01/21/2016 9:20 PM

## 2016-01-22 DIAGNOSIS — I340 Nonrheumatic mitral (valve) insufficiency: Secondary | ICD-10-CM

## 2016-01-22 LAB — MAGNESIUM: Magnesium: 1.9 mg/dL (ref 1.7–2.4)

## 2016-01-22 LAB — CBC
HEMATOCRIT: 29.9 % — AB (ref 39.0–52.0)
Hemoglobin: 9.5 g/dL — ABNORMAL LOW (ref 13.0–17.0)
MCH: 29.4 pg (ref 26.0–34.0)
MCHC: 31.8 g/dL (ref 30.0–36.0)
MCV: 92.6 fL (ref 78.0–100.0)
Platelets: 168 10*3/uL (ref 150–400)
RBC: 3.23 MIL/uL — ABNORMAL LOW (ref 4.22–5.81)
RDW: 14.5 % (ref 11.5–15.5)
WBC: 9.3 10*3/uL (ref 4.0–10.5)

## 2016-01-22 LAB — BASIC METABOLIC PANEL
Anion gap: 10 (ref 5–15)
BUN: 27 mg/dL — ABNORMAL HIGH (ref 6–20)
CHLORIDE: 105 mmol/L (ref 101–111)
CO2: 23 mmol/L (ref 22–32)
Calcium: 9 mg/dL (ref 8.9–10.3)
Creatinine, Ser: 1.5 mg/dL — ABNORMAL HIGH (ref 0.61–1.24)
GFR calc non Af Amer: 41 mL/min — ABNORMAL LOW (ref >60)
GFR, EST AFRICAN AMERICAN: 48 mL/min — AB (ref >60)
Glucose, Bld: 127 mg/dL — ABNORMAL HIGH (ref 65–99)
Potassium: 4.3 mmol/L (ref 3.5–5.1)
Sodium: 138 mmol/L (ref 135–145)

## 2016-01-22 LAB — TROPONIN I
TROPONIN I: 19.89 ng/mL — AB (ref <0.031)
Troponin I: 29.98 ng/mL (ref <0.031)

## 2016-01-22 LAB — PROCALCITONIN: Procalcitonin: 0.63 ng/mL

## 2016-01-22 LAB — PHOSPHORUS: PHOSPHORUS: 3 mg/dL (ref 2.5–4.6)

## 2016-01-22 MED ORDER — CARVEDILOL 6.25 MG PO TABS
6.2500 mg | ORAL_TABLET | Freq: Two times a day (BID) | ORAL | Status: DC
Start: 2016-01-22 — End: 2016-01-24
  Administered 2016-01-22 – 2016-01-24 (×4): 6.25 mg via ORAL
  Filled 2016-01-22 (×2): qty 1
  Filled 2016-01-22: qty 2
  Filled 2016-01-22 (×2): qty 1

## 2016-01-22 MED ORDER — FUROSEMIDE 40 MG PO TABS
40.0000 mg | ORAL_TABLET | Freq: Every day | ORAL | Status: DC
  Administered 2016-01-22 – 2016-01-24 (×3): 40 mg via ORAL
  Filled 2016-01-22 (×3): qty 1

## 2016-01-22 MED ORDER — ISOSORBIDE MONONITRATE ER 30 MG PO TB24
15.0000 mg | ORAL_TABLET | Freq: Every day | ORAL | Status: DC
  Administered 2016-01-22 – 2016-01-24 (×3): 15 mg via ORAL
  Filled 2016-01-22 (×4): qty 1

## 2016-01-22 MED ORDER — DIPHENOXYLATE-ATROPINE 2.5-0.025 MG/5ML PO LIQD
5.0000 mL | Freq: Four times a day (QID) | ORAL | Status: DC | PRN
  Administered 2016-01-22 (×2): 5 mL via ORAL
  Filled 2016-01-22 (×2): qty 5

## 2016-01-22 MED ORDER — RAMIPRIL 5 MG PO CAPS
5.0000 mg | ORAL_CAPSULE | Freq: Every day | ORAL | Status: DC
  Administered 2016-01-23 – 2016-01-24 (×2): 5 mg via ORAL
  Filled 2016-01-22 (×2): qty 1

## 2016-01-22 NOTE — Progress Notes (Signed)
SUBJECTIVE: still having episodes of hemoptysis. No cp/sob, n/v, coughing, fever/chills.   BP 119/71 mmHg  Pulse 72  Temp(Src) 97.3 F (36.3 C) (Oral)  Resp 19  Ht 5' 8"  (1.727 m)  Wt 96 kg (211 lb 10.3 oz)  BMI 32.19 kg/m2  SpO2 97%  Intake/Output Summary (Last 24 hours) at 01/22/16 9150 Last data filed at 01/22/16 0700  Gross per 24 hour  Intake 1227.18 ml  Output   1120 ml  Net 107.18 ml    PHYSICAL EXAM General: Well developed, well nourished, in no acute distress. Alert and oriented x 3.  Psych:  Good affect, responds appropriately Neck: No JVD. No masses noted.  Lungs: CTAB Heart: RRR with no murmurs noted. Abdomen: Bowel sounds are present. Soft, non-tender.  Extremities: trace edema b/l LE's.   LABS: Basic Metabolic Panel:  Recent Labs  01/21/16 0420 01/22/16 0205  NA 140 138  K 4.6 4.3  CL 107 105  CO2 23 23  GLUCOSE 155* 127*  BUN 24* 27*  CREATININE 1.70* 1.50*  CALCIUM 9.2 9.0  MG  --  1.9  PHOS  --  3.0   CBC:  Recent Labs  01/21/16 0420 01/22/16 0205  WBC 11.7* 9.3  HGB 10.2* 9.5*  HCT 32.3* 29.9*  MCV 92.8 92.6  PLT 176 168   Cardiac Enzymes:  Recent Labs  01/21/16 0420 01/21/16 2030 01/22/16 0205  TROPONINI >65.00* 26.28* 29.98*   Fasting Lipid Panel:  Recent Labs  01/21/16 0420  CHOL 87  HDL 50  LDLCALC 27  TRIG 50  CHOLHDL 1.7    Current Meds: . antiseptic oral rinse  7 mL Mouth Rinse BID  . aspirin EC  81 mg Oral Daily  . atorvastatin  40 mg Oral q1800  . carvedilol  3.125 mg Oral BID  . ferrous sulfate  325 mg Oral BID WC  . multivitamin with minerals  1 tablet Oral Daily  . ramipril  2.5 mg Oral Daily  . ranolazine  1,000 mg Oral BID  . vitamin B-12  1,000 mcg Oral Daily     ASSESSMENT AND PLAN:  Active Problems:   Paroxysmal atrial fibrillation (HCC)   CAD (coronary artery disease)   Chronic combined systolic and diastolic heart failure (HCC)   Acute MI (HCC)   Hemoptysis   Acute on  chronic combined systolic and diastolic CHF (congestive heart failure) (HCC)   Hypoxemia   Chronic kidney disease (CKD), stage IV (severe) ([place])   82 yo male with hx of ischemic cardiomyopathy, pAfib, knowns CAD with CTO LCx and OM2 who was being medically managed comes in with chest pain with elevated troponin and new LBBB.   Acute MI with delayed persentation- With trop >65, new incomplete LBBB. Diffuse hypokinesis. Mod-severe MR. This is likely 2/2 to completed large inferior infarct with extension into inferolateral wall (collateral flow to LCX comes from RCA for him). There is unlikely any myocardium that can be salvaged. In the setting of hemoptysis and ?pulm hemorrhage, doing PCI/stent and putting him on DAPT will be risky and outweigh the potential benefit.   - cont asa, lipitor, coreg, ranexa, lipitor nitro gtt.  - consider d/cing Heparin today. - restart Ace I since not doing Cath now and renal function has improved.  -f/up PCCM rec regarding hemoptysis workup  Hemoptysis -  echo showed moderate-severe MR. Acute MR from MI can cause hemoptysis but there may be other causes. CT chest shows right sided peribronchial infiltrate  which could be PNA vs hemorrhage. Very atypical looking for pulm edema from heart failure. CRP and ESR both elevated (which could be 2/2 to MI), ANCA negative. PCCM following. Monitor for further events.  Acute CHF with known combined cardiomyopathy - previous EF was 40-45% on 11/2014 ECHO, 50-5% EF on 07/2015 with basalinferior hypokinesis. On repeat this admission it's 35-40% with diffuse hypokinesis. - doesn't seem overtly volume overloaded on exam. Did get 1x 40 IV lasix and his lung exam is clear with this and he is breathing better now.  - Could consider PO 58m lasix today again. - cont ace I as above.   PAfib - now in NSR. Was on eliquis but was d/ced due to anemia. Continue ASA for now. D/ced heparin. May consider restarting eliquis when hemoptysis resolves.     CKD 3-4 - b/l crt around 1.6, was elevated initially but now 1.50. Resumed rampril since renal function is improved and not planning cath now.   Normocytic anemia - b/l 10-11. Now in 10's. Likely 2/2 to CKD. Iron panel showed high ferritin with normal sat%.   Ahmed, TChesley Mires 2/17/20177:52 AM  I have seen and examined the patient along with ADellia Nims MD.  I have reviewed the chart, notes and new data.  I agree with his note.  Key new complaints: breathing greatly improved, was able to sleep last night; occasional mild hemoptysis Key examination changes: clear lungs, +ve S4 Key new findings / data: Hgb stable, creatinine improved  PLAN:  Suspect that his infiltrates represent pulmonary edema due to depressed LVEF and moderate to severe ischemic mitral regurgitation. He improved with diuresis, quick improvement not c/w pulmonary hemorrhage. At this point, little benefit expected from coronary angiography (delayed presentation, completed infarct, very high troponin level). Echo consistent with interval occlusion of RCA, that also supplied most of the collateral flow to the distal LCX territory. High risk for complications with angiography - worsening renal insufficiency and hemoptysis. Will treat conservatively. DC heparin (after 72h). Hold off clopidogrel until hemoptysis stops. Increase ACEi. Daily diuretic, monitor renal function. Beta blockers. Switch to PO nitrates.  Consider viability evaluation in several weeks, after the hemoptysis resolves. If there is a large area of viable ischemic myocardium, consider PCI at that time, especially if he is having angina.  MSanda Klein MD, F[place]ena [place]([phone]/17/2017, 9:52 AM

## 2016-01-22 NOTE — Progress Notes (Signed)
Name: [name] [name] MRN: [id number] DOB: Sep 20, 1930    ADMISSION DATE:  01/20/2016 CONSULTATION DATE:  2/15  REFERRING MD :  Croitoru   CHIEF COMPLAINT:  Hemoptysis   BRIEF PATIENT DESCRIPTION:  66 yom w/ extensive cardiac disease: ICM EF 40-45%, CAD, PAF. Admitted 2/15 w/ NSTEMI, dyspnea, bilateral infiltrates & hemoptysis. PCCM was asked to see RE: hemoptysis   SIGNIFICANT EVENTS    STUDIES:  CT chest 2/15>>>   HISTORY OF PRESENT ILLNESS:   This is a pleasant 82 year old male w/ sig h/o extensive CAD (prior STEMI of L Circ 2015; ICM w/ EF 40-50%), Chronic combined systolic and diastolic HF, PAF, HTN and stage III-IV CKD. Was in usual state of health until ~ 2 weeks prior to presenting to the ED on 2/15. He reported that over the last 2 weeks had had increased exertional dyspnea and episodic chest pain which would occur w/ exertion and would go away with rest. He has had no new HA, epistaxis, chest congestion or fever. His wife had been sick w/ URI but he has not had similar symptoms. During the afternoon of 2/14 he noted much worse chest discomfort while feeding his pets and this was also associated w/ significant dyspnea. These symptoms did not readily resolve w/ rest and he also coughed up about a 1/2 teaspoon of blood twice that evening. During the night he continued to have resting dyspnea as well as + orthopnea. Since the am he has had three more episodes of coughing up blood w/ similar amount and character. On arrival to the ER he had an EKG which showed almost complete LBBB and wide spread T wave inversion, his Trop I was >35 and his CXR showed bilateral infiltrates. Because of this PCCM was asked to see.  >recently stopped antihypertensives, diuretic and eliquis   PAST MEDICAL HISTORY :   has a past medical history of HLD (hyperlipidemia); NSTEMI (non-ST elevated myocardial infarction) ([place]); Type II diabetes mellitus ([place]); Paroxysmal atrial fibrillation ([place]); Chronic combined  systolic and diastolic CHF (congestive heart failure) ([place]); CAD (coronary artery disease); Pulmonary edema; Renal insufficiency; Obesity; Hypertension; and History of echocardiogram.  has past surgical history that includes Appendectomy; Total knee arthroplasty (Left, 2013); Tonsillectomy; Joint replacement; Cardiac catheterization (11/24/2014); left heart catheterization with coronary angiogram (N/A, 11/24/2014); Esophagogastroduodenoscopy (egd) with propofol (N/A, 07/16/2015); and Colonoscopy with propofol (N/A, 07/16/2015). Prior to Admission medications   Medication Sig Start Date End Date Taking? Authorizing Provider  acetaminophen (TYLENOL) 500 MG tablet Take 500 mg by mouth every 6 (six) hours as needed for moderate pain.   Yes Historical Provider, MD  aspirin EC 81 MG tablet Take 1 tablet (81 mg total) by mouth daily. 12/10/15  Yes Dorothy Spark, MD  atorvastatin (LIPITOR) 40 MG tablet Take 1 tablet (40 mg total) by mouth daily at 6 PM. 04/29/15  Yes Scott T Kathlen Mody, PA-C  carvedilol (COREG) 3.125 MG tablet Take 1 tablet (3.125 mg total) by mouth 2 (two) times daily. 12/25/15  Yes Jerline Pain, MD  Eluxadoline (VIBERZI) 75 MG TABS Take 75 mg by mouth daily.   Yes Historical Provider, MD  ferrous sulfate 325 (65 FE) MG EC tablet Take twice a day with orange juice. 08/18/15  Yes Wyatt Portela, MD  multivitamin-iron-minerals-folic acid (CENTRUM) chewable tablet Chew 1 tablet by mouth daily.   Yes Historical Provider, MD  ramipril (ALTACE) 2.5 MG capsule Take 1 capsule (2.5 mg total) by mouth daily. 12/25/15  Yes Mark C  Marlou Porch, MD  ranolazine (RANEXA) 1000 MG SR tablet Take 1 tablet (1,000 mg total) by mouth 2 (two) times daily. 12/10/15  Yes Dorothy Spark, MD  tamsulosin (FLOMAX) 0.4 MG CAPS capsule Take 0.4 mg by mouth daily as needed (BPH).   Yes Historical Provider, MD  vitamin B-12 (CYANOCOBALAMIN) 1000 MCG tablet Take 1,000 mcg by mouth daily.   Yes Historical Provider, MD  nitroGLYCERIN  (NITROSTAT) 0.4 MG SL tablet Place 1 tablet (0.4 mg total) under the tongue every 5 (five) minutes x 3 doses as needed for chest pain. 11/26/14   Eileen Stanford, PA-C   Allergies  Allergen Reactions  . Lisinopril     cough    FAMILY HISTORY:  family history includes Cancer (age of onset: 7) in his father; Heart attack in his brother; Heart disease in his brother, sister, and sister; Stroke in his mother. There is no history of Hypertension. SOCIAL HISTORY:  reports that he has never smoked. He has never used smokeless tobacco. He reports that he drinks about 8.4 oz of alcohol per week. He reports that he does not use illicit drugs.  REVIEW OF SYSTEMS:   Constitutional: Negative for fever, chills, weight loss, malaise/fatigue and diaphoresis.  HENT: Negative for hearing loss, ear pain, nosebleeds, congestion, sore throat, neck pain, tinnitus and ear discharge.   Eyes: Negative for blurred vision, double vision, photophobia, pain, discharge and redness.  Respiratory: + hemoptysis teaspoon at a time, + shortness of breath, wheezing and stridor.   Cardiovascular: +chest pain, palpitations, + orthopnea, claudication, + leg swelling and PND.  Gastrointestinal: Negative for heartburn, nausea, vomiting, abdominal pain, diarrhea, constipation, blood in stool and melena.  Genitourinary: Negative for dysuria, urgency, frequency, hematuria and flank pain.  Musculoskeletal: Negative for myalgias, back pain, joint pain and falls.  Skin: Negative for itching and rash.  Neurological: Negative for dizziness, tingling, tremors, sensory change, speech change, focal weakness, seizures, loss of consciousness, weakness and headaches.  Endo/Heme/Allergies: Negative for environmental allergies and polydipsia. Does not bruise/bleed easily.  SUBJECTIVE: no distress on NTG gtt   VITAL SIGNS: Temp:  [97.3 F (36.3 C)-98.2 F (36.8 C)] 97.9 F (36.6 C) (02/17 0800) Pulse Rate:  [66-101] 72 (02/17  0700) Resp:  [14-30] 19 (02/17 0700) BP: (103-154)/(64-87) 119/71 mmHg (02/17 0700) SpO2:  [91 %-98 %] 97 % (02/17 0700) Weight:  [96 kg (211 lb 10.3 oz)] 96 kg (211 lb 10.3 oz) (02/17 0400) 2 liters  PHYSICAL EXAMINATION: General:  82 year old male, resting comfortably in bed. No distress.  Neuro:  Awake, no focal def, oriented x 3 HEENT:  MMM, no JVD, no adenopathy  Cardiovascular:  rrr w/out MRG  Lungs:  Equal, crackles both bases. No accessory muscle use  Abdomen:  Soft, not tender + bowel sounds Musculoskeletal:  Equal strength and bulk  Skin:  Warm and intact brisk CR    Recent Labs Lab 01/20/16 1236 01/21/16 0420 01/22/16 0205  NA 141 140 138  K 5.1 4.6 4.3  CL 106 107 105  CO2 21* 23 23  BUN 25* 24* 27*  CREATININE 2.04* 1.70* 1.50*  GLUCOSE 220* 155* 127*    Recent Labs Lab 01/20/16 1236 01/21/16 0420 01/22/16 0205  HGB 11.6* 10.2* 9.5*  HCT 36.0* 32.3* 29.9*  WBC 13.2* 11.7* 9.3  PLT 206 176 168   Dg Chest 2 View  01/20/2016  CLINICAL DATA:  Left-sided chest pain shortness of breath for 2 days EXAM: CHEST  2 VIEW COMPARISON:  04/24/2015 FINDINGS: Cardiac shadow is at the upper limits of normal in size. Some platelike atelectasis is noted in the left lung base which may be related to scarring. Mild vascular congestion is again seen with changes of interstitial edema. No focal confluent infiltrate is seen. IMPRESSION: Mild CHF with interstitial edema. Electronically Signed   By: Inez Catalina M.D.   On: 01/20/2016 12:54   Ct Chest Wo Contrast  01/20/2016  CLINICAL DATA:  Pleural effusion on chest x-ray. EXAM: CT CHEST WITHOUT CONTRAST TECHNIQUE: Multidetector CT imaging of the chest was performed following the standard protocol without IV contrast. COMPARISON:  Chest x-ray earlier the same day. FINDINGS: Mediastinum / Lymph Nodes: There is no axillary lymphadenopathy. 9 mm right thyroid nodule noted. No mediastinal lymphadenopathy. Calcified lymph nodes are seen in  the subcarinal station and right hilum. The esophagus has normal imaging features. Heart size is upper normal. Coronary artery calcification is noted. Lungs / Pleura: There is central ground-glass attenuation involving the right upper, middle, and lower lobes with a central predominance. Scattered areas of interlobular septal thickening associated. There is relative sparing at the right lung base. Mild dependent atelectasis or airspace disease is seen in the left lower lobe. Small bilateral pleural effusions are noted. Upper Abdomen: Bilateral adrenal nodules measure in the 1-2 cm size range. These are incompletely visualized but where seen show average attenuation less than 10 Hounsfield units, most consistent with adenomas. MSK / Soft Tissues: Bone windows reveal no worrisome lytic or sclerotic osseous lesions. IMPRESSION: Diffuse central alveolar opacity in the right lung with a somewhat peribronchovascular nodular distribution and peripheral/ basilar sparing. Imaging features are probably related to diffuse right-sided pneumonia given the asymmetry although asymmetric pulmonary edema cannot be entirely excluded. Pulmonary hemorrhage would also be a consideration. Small bilateral pleural effusions. Bilateral adrenal nodules most consistent with adenomas. Electronically Signed   By: Misty Stanley M.D.   On: 01/20/2016 17:16   I reviewed chest CT myself, infiltrate noted on the right lung, likely aspirated blood, do not suspect infection.  ASSESSMENT / PLAN: NSTEMI w/ known underlying ICM Plan Per cards  Hemoptysis in the setting of bilateral nodular pulmonary infiltrates.  ->d-dx: infectious vs vasculitis vs pulmonary edema. Would favor edema given the progression of his symptoms after stopping recent antihypertensives and diuretics. Leading to scenario of pulmonary edema or fractured capillary syndrome resulting in the hemoptysis. Less likely vasculitis but CXR findings are nodular and does have  underlying renal involvement.  Plan CT chest noted, likely aspirated blood. Auto-immune markers negative this is not an autoimmune issue, no steroids needed. PCT 0.63, very unlikely to be infectious. Flutter valve to assist with sputum clearance. If continues to cough blood by next week will need pulmonary f/u as outpatient (patient is being discharged this weekend).  CKD Plan Renal dose meds No cath.  Anemia of chronic disease Plan Trend cbc on heparin   DM Plan CBG SSI   Discussed with bedside RN.  PCCM will sign off, please call back if needed.  Rush Farmer, M.D. [facility] Pulmonary/Critical Care Medicine. Pager: [phone]. After hours pager: [phone].  01/22/2016, 9:36 AM

## 2016-01-23 DIAGNOSIS — I25110 Atherosclerotic heart disease of native coronary artery with unstable angina pectoris: Secondary | ICD-10-CM

## 2016-01-23 DIAGNOSIS — R042 Hemoptysis: Secondary | ICD-10-CM | POA: Diagnosis not present

## 2016-01-23 DIAGNOSIS — I214 Non-ST elevation (NSTEMI) myocardial infarction: Secondary | ICD-10-CM | POA: Diagnosis not present

## 2016-01-23 DIAGNOSIS — I5043 Acute on chronic combined systolic (congestive) and diastolic (congestive) heart failure: Secondary | ICD-10-CM | POA: Diagnosis not present

## 2016-01-23 LAB — CBC
HCT: 31.3 % — ABNORMAL LOW (ref 39.0–52.0)
HEMOGLOBIN: 10 g/dL — AB (ref 13.0–17.0)
MCH: 29.5 pg (ref 26.0–34.0)
MCHC: 31.9 g/dL (ref 30.0–36.0)
MCV: 92.3 fL (ref 78.0–100.0)
Platelets: 197 10*3/uL (ref 150–400)
RBC: 3.39 MIL/uL — AB (ref 4.22–5.81)
RDW: 14.3 % (ref 11.5–15.5)
WBC: 7.2 10*3/uL (ref 4.0–10.5)

## 2016-01-23 LAB — PROCALCITONIN: Procalcitonin: 0.36 ng/mL

## 2016-01-23 NOTE — Evaluation (Signed)
Physical Therapy Evaluation Patient Details Name: [name] [name] MRN: [id number] DOB: 1930/04/21 Today's Date: 01/23/2016   History of Present Illness  This is a pleasant 82 year old male w/ sig h/o extensive CAD (prior STEMI of L Circ 2015; ICM w/ EF 40-50%), Chronic combined systolic and diastolic HF, PAF, HTN and stage III-IV CKD. Was in usual state of health until ~ 2 weeks prior to presenting to the ED on 2/15. He reported that over the last 2 weeks had had increased exertional dyspnea and episodic chest pain which would occur w/ exertion and would go away with rest. He has had no new HA, epistaxis, chest congestion or fever. His wife had been sick w/ URI but he has not had similar symptoms. During the afternoon of 2/14 he noted much worse chest discomfort while feeding his pets and this was also associated w/ significant dyspnea. These symptoms did not readily resolve w/ rest and he also coughed up about a 1/2 teaspoon of blood twice that evening. During the night he continued to have resting dyspnea as well as + orthopnea. Since the am he has had three more episodes of coughing up blood w/ similar amount and character. On arrival to the ER he had an EKG which showed almost complete LBBB and wide spread T wave inversion, his Trop I was >35 and his CXR showed bilateral infiltrates. Because of this PCCM was asked to see.   Clinical Impression  Pt admitted with above diagnosis. Pt currently with functional limitations due to the deficits listed below (see PT Problem List). Pt was able to ambulate in hallway without device with good balance overall.  Occasional HHA but has cane and RW at home andstates he will use them prn.  Feels he is close to baseline.  Will follow acutely.   Pt will benefit from skilled PT to increase their independence and safety with mobility to allow discharge to the venue listed below.      Follow Up Recommendations No PT follow up;Supervision/Assistance - 24 hour    Equipment  Recommendations  None recommended by PT    Recommendations for Other Services       Precautions / Restrictions Precautions Precautions: Fall Required Braces or Orthoses: Other Brace/Splint (states he wears right knee brace at home) Restrictions Weight Bearing Restrictions: No      Mobility  Bed Mobility Overal bed mobility: Independent             General bed mobility comments: Pt a little slow but was able to get to EOb on his own.   Transfers Overall transfer level: Independent                  Ambulation/Gait Ambulation/Gait assistance: Min guard Ambulation Distance (Feet): 125 Feet Assistive device: 1 person hand held assist Gait Pattern/deviations: Step-through pattern;Decreased stride length;Wide base of support;Antalgic   Gait velocity interpretation: Below normal speed for age/gender General Gait Details: Overall, pt steady without the use of a device.  Pt used 1 hand held assist part of time and some of time no device or HHA.  Uses cane at home and pt and son feel that pt is close to baseline at present.  Pt overall steady.  Discussed safety in home to include picking up throw rugs and not letting dogs trip pt up and pt and son agree with safety ideas.  Pt also states he will use RW if needed.  He states he is always safe at home.   Stairs  Wheelchair Mobility    Modified Rankin (Stroke Patients Only)       Balance Overall balance assessment: Needs assistance Sitting-balance support: No upper extremity supported;Feet supported Sitting balance-Leahy Scale: Fair     Standing balance support: Single extremity supported;No upper extremity supported;During functional activity Standing balance-Leahy Scale: Fair Standing balance comment: can stand statically without UE support and balance.               High level balance activites: Direction changes;Turns;Sudden stops High Level Balance Comments: min guard assist               Pertinent Vitals/Pain Pain Assessment: No/denies pain  VSS with sats >90% on RA with all activity.      Home Living Family/patient expects to be discharged to:: Private residence Living Arrangements: Spouse/significant other Available Help at Discharge: Family;Available 24 hours/day Type of Home: House Home Access: Stairs to enter Entrance Stairs-Rails: None Entrance Stairs-Number of Steps: 1 Home Layout: One level Home Equipment: Walker - 2 wheels;Cane - single point      Prior Function Level of Independence: Independent with assistive device(s)         Comments: Pt drives, used cane at times per pt.     Hand Dominance   Dominant Hand: Right    Extremity/Trunk Assessment   Upper Extremity Assessment: Defer to OT evaluation           Lower Extremity Assessment: Generalized weakness      Cervical / Trunk Assessment: Normal  Communication   Communication: No difficulties  Cognition Arousal/Alertness: Awake/alert Behavior During Therapy: WFL for tasks assessed/performed Overall Cognitive Status: Within Functional Limits for tasks assessed                      General Comments      Exercises General Exercises - Lower Extremity Ankle Circles/Pumps: AROM;Both;10 reps;Seated Long Arc Quad: AROM;Both;10 reps;Seated      Assessment/Plan    PT Assessment Patient needs continued PT services  PT Diagnosis Generalized weakness   PT Problem List Decreased activity tolerance;Decreased strength;Decreased balance;Decreased mobility;Decreased knowledge of use of DME;Decreased safety awareness;Decreased knowledge of precautions  PT Treatment Interventions DME instruction;Gait training;Functional mobility training;Therapeutic activities;Stair training;Therapeutic exercise;Balance training;Patient/family education   PT Goals (Current goals can be found in the Care Plan section) Acute Rehab PT Goals Patient Stated Goal: to go home PT Goal Formulation:  With patient Time For Goal Achievement: 02/06/16 Potential to Achieve Goals: Good    Frequency Min 3X/week   Barriers to discharge        Co-evaluation               End of Session Equipment Utilized During Treatment: Gait belt Activity Tolerance: Patient limited by fatigue Patient left: in chair;with call bell/phone within reach;with chair alarm set;with family/visitor present Nurse Communication: Mobility status         Time: 1414-1435 PT Time Calculation (min) (ACUTE ONLY): 21 min   Charges:   PT Evaluation $PT Eval Moderate Complexity: 1 Procedure     PT G CodesIrwin Brakeman F 02/19/16, 3:30 PM  [facility] Acute Rehabilitation [phone] (pager)

## 2016-01-23 NOTE — Progress Notes (Signed)
SUBJECTIVE: The patient is doing well today.  SOB is improved.  Denies chest pain.  Hemoptysis much better.     Marland Kitchen antiseptic oral rinse  7 mL Mouth Rinse BID  . aspirin EC  81 mg Oral Daily  . atorvastatin  40 mg Oral q1800  . carvedilol  6.25 mg Oral BID  . ferrous sulfate  325 mg Oral BID WC  . furosemide  40 mg Oral Daily  . isosorbide mononitrate  15 mg Oral Daily  . multivitamin with minerals  1 tablet Oral Daily  . ramipril  5 mg Oral Daily  . ranolazine  1,000 mg Oral BID  . vitamin B-12  1,000 mcg Oral Daily      OBJECTIVE: Physical Exam: Filed Vitals:   01/23/16 0000 01/23/16 0200 01/23/16 0318 01/23/16 0400  BP: 127/73 135/76  124/71  Pulse:      Temp: 97.6 F (36.4 C)   97.6 F (36.4 C)  TempSrc: Oral   Oral  Resp:    18  Height:      Weight:   208 lb 8.9 oz (94.6 kg)   SpO2: 98%   96%    Intake/Output Summary (Last 24 hours) at 01/23/16 0617 Last data filed at 01/23/16 0300  Gross per 24 hour  Intake    868 ml  Output    825 ml  Net     43 ml     Telemetry reveals sinus rhythm  GEN- The patient is well appearing, alert and oriented x 3 today.   Head- normocephalic, atraumatic Eyes-  Sclera clear, conjunctiva pink Ears- hearing intact Oropharynx- clear Neck- supple,   Lungs- few basilar rales, normal work of breathing Heart- Regular rate and rhythm  GI- soft, NT, ND, + BS Extremities- no clubbing, cyanosis, + dependant edema Skin- no rash or lesion Psych- euthymic mood, full affect Neuro- strength and sensation are intact  LABS: Basic Metabolic Panel:  Recent Labs  01/21/16 0420 01/22/16 0205  NA 140 138  K 4.6 4.3  CL 107 105  CO2 23 23  GLUCOSE 155* 127*  BUN 24* 27*  CREATININE 1.70* 1.50*  CALCIUM 9.2 9.0  MG  --  1.9  PHOS  --  3.0   CBC:  Recent Labs  01/22/16 0205 01/23/16 0303  WBC 9.3 7.2  HGB 9.5* 10.0*  HCT 29.9* 31.3*  MCV 92.6 92.3  PLT 168 197   Cardiac Enzymes:  Recent Labs  01/21/16 2030  01/22/16 0205 01/22/16 1019  TROPONINI 26.28* 29.98* 19.89*   Fasting Lipid Panel:  Recent Labs  01/21/16 0420  CHOL 87  HDL 50  LDLCALC 27  TRIG 50  CHOLHDL 1.7   RADIOLOGY: Dg Chest 2 View  01/20/2016  CLINICAL DATA:  Left-sided chest pain shortness of breath for 2 days EXAM: CHEST  2 VIEW COMPARISON:  04/24/2015 FINDINGS: Cardiac shadow is at the upper limits of normal in size. Some platelike atelectasis is noted in the left lung base which may be related to scarring. Mild vascular congestion is again seen with changes of interstitial edema. No focal confluent infiltrate is seen. IMPRESSION: Mild CHF with interstitial edema. Electronically Signed   By: Inez Catalina M.D.   On: 01/20/2016 12:54   Ct Chest Wo Contrast  01/20/2016  CLINICAL DATA:  Pleural effusion on chest x-ray. EXAM: CT CHEST WITHOUT CONTRAST TECHNIQUE: Multidetector CT imaging of the chest was performed following the standard protocol without IV contrast. COMPARISON:  Chest x-ray earlier  the same day. FINDINGS: Mediastinum / Lymph Nodes: There is no axillary lymphadenopathy. 9 mm right thyroid nodule noted. No mediastinal lymphadenopathy. Calcified lymph nodes are seen in the subcarinal station and right hilum. The esophagus has normal imaging features. Heart size is upper normal. Coronary artery calcification is noted. Lungs / Pleura: There is central ground-glass attenuation involving the right upper, middle, and lower lobes with a central predominance. Scattered areas of interlobular septal thickening associated. There is relative sparing at the right lung base. Mild dependent atelectasis or airspace disease is seen in the left lower lobe. Small bilateral pleural effusions are noted. Upper Abdomen: Bilateral adrenal nodules measure in the 1-2 cm size range. These are incompletely visualized but where seen show average attenuation less than 10 Hounsfield units, most consistent with adenomas. MSK / Soft Tissues: Bone windows  reveal no worrisome lytic or sclerotic osseous lesions. IMPRESSION: Diffuse central alveolar opacity in the right lung with a somewhat peribronchovascular nodular distribution and peripheral/ basilar sparing. Imaging features are probably related to diffuse right-sided pneumonia given the asymmetry although asymmetric pulmonary edema cannot be entirely excluded. Pulmonary hemorrhage would also be a consideration. Small bilateral pleural effusions. Bilateral adrenal nodules most consistent with adenomas. Electronically Signed   By: Misty Stanley M.D.   On: 01/20/2016 17:16    ASSESSMENT AND PLAN:   82 yo male with hx of ischemic cardiomyopathy, pAfib, knowns CAD with CTO LCx and OM2 who was being medically managed comes in with chest pain with markedly elevated troponin and new LBBB.  Treated conservatively in the setting of active hemoptysis.  Now recovering/ improved.  Acute MI with delayed persentation- With trop >65, new incomplete LBBB. Diffuse hypokinesis. Mod-severe MR. This is likely 2/2 to completed large inferior infarct with extension into inferolateral wall (collateral flow to LCX comes from RCA for him). There is unlikely any myocardium that can be salvaged. In the setting of hemoptysis and ?pulm hemorrhage, doing PCI/stent and putting him on DAPT felt to be risky and outweigh the potential benefit.  Decision made to treat medically He has improved clinically and now wants to go home - cont asa, lipitor, coreg, ranexa, lipitor, ace inhibitor   Consider viability evaluation in several weeks, after the hemoptysis resolves. If there is a large area of viable ischemic myocardium, consider PCI at that time, especially if he is having angina.  Hemoptysis -per pulm  Acute CHF with known combined cardiomyopathy - previous EF was 40-45% on 11/2014 ECHO, 50-5% EF on 07/2015 with basalinferior hypokinesis. On repeat this admission it's 35-40% with diffuse hypokinesis. - on adequate medical  treatment Continue to titrate as an outpatient Follow Is/Os and creatinine with lasix closely  PAfib - now in NSR. Resume eliquis once hemoptysis resolves.   CKD 3-4 -labs pending this am,  Creatinine is improving, follow now back on lasix  Normocytic anemia - stable,  Likely 2/2 to CKD. Iron panel showed high ferritin with normal sat%.  Transfer to telemetry Hopefully home tomorrow  Thompson Grayer, MD 01/23/2016 6:17 AM

## 2016-01-23 NOTE — Progress Notes (Addendum)
[id number] Patient given MI booklet and low sodium diet information. Reviewed risk factors for heart disease and use of sublingual nitroglycerin and when to call 911. Mr [name] [name] refused to walk this morning. "I cant walk far due to my bad knees." Mr [name] [name] is not interested in phase 2  Cardiac rehab at this time.

## 2016-01-24 ENCOUNTER — Encounter (HOSPITAL_COMMUNITY): Payer: Self-pay | Admitting: Nurse Practitioner

## 2016-01-24 DIAGNOSIS — I214 Non-ST elevation (NSTEMI) myocardial infarction: Secondary | ICD-10-CM

## 2016-01-24 DIAGNOSIS — I25110 Atherosclerotic heart disease of native coronary artery with unstable angina pectoris: Secondary | ICD-10-CM | POA: Diagnosis not present

## 2016-01-24 DIAGNOSIS — I340 Nonrheumatic mitral (valve) insufficiency: Secondary | ICD-10-CM | POA: Diagnosis not present

## 2016-01-24 DIAGNOSIS — I5043 Acute on chronic combined systolic (congestive) and diastolic (congestive) heart failure: Secondary | ICD-10-CM | POA: Diagnosis not present

## 2016-01-24 DIAGNOSIS — I119 Hypertensive heart disease without heart failure: Secondary | ICD-10-CM

## 2016-01-24 LAB — CBC
HCT: 30.6 % — ABNORMAL LOW (ref 39.0–52.0)
HEMOGLOBIN: 9.7 g/dL — AB (ref 13.0–17.0)
MCH: 29.5 pg (ref 26.0–34.0)
MCHC: 31.7 g/dL (ref 30.0–36.0)
MCV: 93 fL (ref 78.0–100.0)
PLATELETS: 204 10*3/uL (ref 150–400)
RBC: 3.29 MIL/uL — ABNORMAL LOW (ref 4.22–5.81)
RDW: 14.4 % (ref 11.5–15.5)
WBC: 5.9 10*3/uL (ref 4.0–10.5)

## 2016-01-24 LAB — BASIC METABOLIC PANEL
ANION GAP: 9 (ref 5–15)
BUN: 26 mg/dL — AB (ref 6–20)
CO2: 26 mmol/L (ref 22–32)
Calcium: 8.9 mg/dL (ref 8.9–10.3)
Chloride: 105 mmol/L (ref 101–111)
Creatinine, Ser: 1.54 mg/dL — ABNORMAL HIGH (ref 0.61–1.24)
GFR calc Af Amer: 46 mL/min — ABNORMAL LOW (ref >60)
GFR calc non Af Amer: 40 mL/min — ABNORMAL LOW (ref >60)
GLUCOSE: 128 mg/dL — AB (ref 65–99)
POTASSIUM: 3.4 mmol/L — AB (ref 3.5–5.1)
Sodium: 140 mmol/L (ref 135–145)

## 2016-01-24 MED ORDER — CARVEDILOL 6.25 MG PO TABS: 6.2500 mg | ORAL_TABLET | Freq: Two times a day (BID) | ORAL | Status: DC

## 2016-01-24 MED ORDER — ISOSORBIDE MONONITRATE ER 30 MG PO TB24: 15.0000 mg | ORAL_TABLET | Freq: Every day | ORAL | Status: DC

## 2016-01-24 MED ORDER — POTASSIUM CHLORIDE ER 20 MEQ PO TBCR: 20.0000 meq | EXTENDED_RELEASE_TABLET | Freq: Every day | ORAL | Status: DC

## 2016-01-24 MED ORDER — RAMIPRIL 5 MG PO CAPS: 5.0000 mg | ORAL_CAPSULE | Freq: Every day | ORAL | Status: DC

## 2016-01-24 MED ORDER — FUROSEMIDE 40 MG PO TABS: 40.0000 mg | ORAL_TABLET | Freq: Every day | ORAL | Status: DC

## 2016-01-24 NOTE — Progress Notes (Addendum)
SUBJECTIVE: The patient is doing well today.  SOB is improved.  Denies chest pain.  Hemoptysis has resolved. .     . antiseptic oral rinse  7 mL Mouth Rinse BID  . aspirin EC  81 mg Oral Daily  . atorvastatin  40 mg Oral q1800  . carvedilol  6.25 mg Oral BID  . ferrous sulfate  325 mg Oral BID WC  . furosemide  40 mg Oral Daily  . isosorbide mononitrate  15 mg Oral Daily  . multivitamin with minerals  1 tablet Oral Daily  . ramipril  5 mg Oral Daily  . ranolazine  1,000 mg Oral BID  . vitamin B-12  1,000 mcg Oral Daily      OBJECTIVE: Physical Exam: Filed Vitals:   01/23/16 1010 01/23/16 1115 01/23/16 2018 01/24/16 0442  BP: 125/62 133/76 146/68 126/61  Pulse: 78 77 72 71  Temp:   98.1 F (36.7 C) 98 F (36.7 C)  TempSrc:   Oral Oral  Resp:   18 18  Height:  5\' 8"  (1.727 m)    Weight:  211 lb 14.4 oz (96.117 kg)  208 lb 14.4 oz (94.756 kg)  SpO2:   99% 93%    Intake/Output Summary (Last 24 hours) at 01/24/16 1129 Last data filed at 01/24/16 0849  Gross per 24 hour  Intake   1200 ml  Output   1350 ml  Net   -150 ml     Telemetry reveals sinus rhythm  GEN- The patient is well appearing, alert and oriented x 3 today.   Head- normocephalic, atraumatic Eyes-  Sclera clear, conjunctiva pink Ears- hearing intact Oropharynx- clear Neck- supple,   Lungs- few basilar rales, normal work of breathing Heart- Regular rate and rhythm , [id number] systolic murmur at the axilla  GI- soft, NT, ND, + BS Extremities- no clubbing, cyanosis, trace leg edema  Skin- no rash or lesion Psych- euthymic mood, full affect Neuro- strength and sensation are intact  LABS: Basic Metabolic Panel:  Recent Labs  01/22/16 0205 01/24/16 0334  NA 138 140  K 4.3 3.4*  CL 105 105  CO2 23 26  GLUCOSE 127* 128*  BUN 27* 26*  CREATININE 1.50* 1.54*  CALCIUM 9.0 8.9  MG 1.9  --   PHOS 3.0  --    CBC:  Recent Labs  01/23/16 0303 01/24/16 0334  WBC 7.2 5.9  HGB 10.0* 9.7*  HCT  31.3* 30.6*  MCV 92.3 93.0  PLT 197 204   Cardiac Enzymes:  Recent Labs  01/21/16 2030 01/22/16 0205 01/22/16 1019  TROPONINI 26.28* 29.98* 19.89*   Fasting Lipid Panel: No results for input(s): CHOL, HDL, LDLCALC, TRIG, CHOLHDL, LDLDIRECT in the last 72 hours. RADIOLOGY: Dg Chest 2 View  01/20/2016  CLINICAL DATA:  Left-sided chest pain shortness of breath for 2 days EXAM: CHEST  2 VIEW COMPARISON:  04/24/2015 FINDINGS: Cardiac shadow is at the upper limits of normal in size. Some platelike atelectasis is noted in the left lung base which may be related to scarring. Mild vascular congestion is again seen with changes of interstitial edema. No focal confluent infiltrate is seen. IMPRESSION: Mild CHF with interstitial edema. Electronically Signed   By: Inez Catalina M.D.   On: 01/20/2016 12:54   Ct Chest Wo Contrast  01/20/2016  CLINICAL DATA:  Pleural effusion on chest x-ray. EXAM: CT CHEST WITHOUT CONTRAST TECHNIQUE: Multidetector CT imaging of the chest was performed following the standard protocol without IV contrast.  COMPARISON:  Chest x-ray earlier the same day. FINDINGS: Mediastinum / Lymph Nodes: There is no axillary lymphadenopathy. 9 mm right thyroid nodule noted. No mediastinal lymphadenopathy. Calcified lymph nodes are seen in the subcarinal station and right hilum. The esophagus has normal imaging features. Heart size is upper normal. Coronary artery calcification is noted. Lungs / Pleura: There is central ground-glass attenuation involving the right upper, middle, and lower lobes with a central predominance. Scattered areas of interlobular septal thickening associated. There is relative sparing at the right lung base. Mild dependent atelectasis or airspace disease is seen in the left lower lobe. Small bilateral pleural effusions are noted. Upper Abdomen: Bilateral adrenal nodules measure in the 1-2 cm size range. These are incompletely visualized but where seen show average  attenuation less than 10 Hounsfield units, most consistent with adenomas. MSK / Soft Tissues: Bone windows reveal no worrisome lytic or sclerotic osseous lesions. IMPRESSION: Diffuse central alveolar opacity in the right lung with a somewhat peribronchovascular nodular distribution and peripheral/ basilar sparing. Imaging features are probably related to diffuse right-sided pneumonia given the asymmetry although asymmetric pulmonary edema cannot be entirely excluded. Pulmonary hemorrhage would also be a consideration. Small bilateral pleural effusions. Bilateral adrenal nodules most consistent with adenomas. Electronically Signed   By: Misty Stanley M.D.   On: 01/20/2016 17:16    ASSESSMENT AND PLAN:   82 yo male with hx of ischemic cardiomyopathy, pAfib, knowns CAD with CTO LCx and OM2 who was being medically managed comes in with chest pain with markedly elevated troponin and new LBBB.  Treated conservatively in the setting of active hemoptysis.  Now recovering/ improved.  Acute MI with delayed persentation- With trop >65, new incomplete LBBB. Diffuse hypokinesis. Mod-severe MR. This is likely 2/2 to completed large inferior infarct with extension into inferolateral wall (collateral flow to LCX comes from RCA for him). There is unlikely any myocardium that can be salvaged. In the setting of hemoptysis and ?pulm hemorrhage, doing PCI/stent and putting him on DAPT felt to be risky and outweigh the potential benefit.  Decision made to treat medically He has improved clinically and now wants to go home - cont asa, lipitor, coreg, ranexa, lipitor, ace inhibitor   Pt is stable for DC.   We can consider a myoview as OP and can consider PCI ( now that he is no longer having hemoptysis.)   Hemoptysis -per pulm  Acute CHF with known combined cardiomyopathy - previous EF was 40-45% on 11/2014 ECHO, 50-5% EF on 07/2015 with basalinferior hypokinesis. On repeat this admission it's 35-40% with diffuse  hypokinesis. - on adequate medical treatment Continue to titrate as an outpatient Follow Is/Os and creatinine with lasix closely  PAfib - now in NSR. Resume eliquis in about 1 week.   This should allow his hemetemisis completely heal   CKD 3-4 -labs pending this am,  Creatinine is improving, follow now back on lasix  Normocytic anemia - stable,  Likely 2/2 to CKD. Iron panel showed high ferritin with normal sat%.  DC to home      Jeannette Maddy, Wonda Cheng, MD  01/24/2016 11:32 AM    [address],  [address] [place], [place]  [address] Pager [phone] Phone: [phone]; Fax: [phone]   [facility]  [address] [place], [place]  [address] [phone]   Fax [phone]

## 2016-01-24 NOTE — Discharge Summary (Signed)
Discharge Summary    Patient ID: [name] [name],  MRN: [id number], DOB/AGE: [birth year] 82 y.o.  Admit date: 01/20/2016 Discharge date: 01/24/2016  Primary Care Provider: Juanita Craver A Primary Cardiologist: Liane Comber, MD   Discharge Diagnoses    Principal Problem:   Acute MI (HCC)/Out of hospital MI/NSTEMI  Active Problems:   CAD (coronary artery disease)   Hemoptysis  **In setting of pulmonary edema.   Acute on chronic combined systolic and diastolic CHF (congestive heart failure) (HCC)  **discharge weight = 208 lbs.   Mitral valve insufficiency, ischemic/Moderate MR   Diabetes mellitus type 2 in obese (HCC)   Chronic kidney disease (CKD), stage III (moderate)   Hypertensive heart disease   Paroxysmal atrial fibrillation (HCC)   Hyperlipidemia   Hypoxemia  Allergies Allergies  Allergen Reactions  . Lisinopril     cough    Diagnostic Studies/Procedures    2D Echocardiogram 2.15.2017  Study Conclusions  - Left ventricle: The cavity size was normal. There was mild   concentric hypertrophy. Systolic function was moderately reduced.   The estimated ejection fraction was in the range of 35% to 40%.   Diffuse hypokinesis. Features are consistent with a pseudonormal   left ventricular filling pattern, with concomitant abnormal   relaxation and increased filling pressure (grade 2 diastolic   dysfunction). Doppler parameters are consistent with elevated   ventricular end-diastolic filling pressure. - Mitral valve: There was moderate regurgitation directed   centrally. - Left atrium: The atrium was severely dilated. - Right ventricle: The cavity size was normal. Wall thickness was   normal. Systolic function was normal. - Right atrium: The atrium was mildly dilated. - Tricuspid valve: There was mild regurgitation. - Pulmonary arteries: Systolic pressure was mildly increased. PA   peak pressure: 39 mm Hg (S). - Inferior vena cava: The vessel was normal in size. -  Pericardium, extracardiac: There was no pericardial effusion.  Impressions:  - There is global hypokinesis and akinesis of the basal and mid   inferior wall.   There is at least moderate mitral regurgitation, previously mild.   Mild pulmonary hypertension. _____________  Chest CT 2.15.2017  IMPRESSION: Diffuse central alveolar opacity in the right lung with a somewhat peribronchovascular nodular distribution and peripheral/ basilar sparing. Imaging features are probably related to diffuse right-sided pneumonia given the asymmetry although asymmetric pulmonary edema cannot be entirely excluded. Pulmonary hemorrhage would also be a consideration.   Small bilateral pleural effusions.   Bilateral adrenal nodules most consistent with adenomas. _____________    History of Present Illness     82 y/o ? with a h/o CAD s/p prior STEMI in 01/2014 with cath at that time revealing a chronic total occlusion of the left circumflex and mild-moderate LAD and RCA disease. He was medically managed. Ejection fraction of the time was 40-45% and this normalized by echo in August 2016.he also has a history of hypertension, hyperlipidemia,Stage III chronic kidney disease, and paroxysmal atrial fibrillation. He was previously treated with eliquis but this was discontinued in January 2015 secondary to anemia and nosebleeds.   He was in his usual state of health until several weeks prior to admission, when he began to ask. Exertional dyspnea and chest tightness. Symptoms typically resolve with rest. On the afternoon prior to admission, he developed recurrent severe chest pain and dyspnea that did not resolve with rest. Symptoms persisted throughout the evening and night and on January 15, he presented to the [facility] emergency department secondary to ongoing chest  pain and also cough with evidence of hemoptysis. Chest x-ray showed interstitial edema. Troponin was greater than 35. ECG showed sinus tachycardia with left  bundle branch block and widespread repolarization abnormalities. He was admitted for further evaluation for out of hospital non-ST segment elevation myocardial infarction.  Hospital Course     Consultants: Pulmonology   Following admission, troponin elevated to over 65. In the setting of volume overload and chronic kidney disease, he was gently diuresed with good response and stable renal function. It was felt that he had likely completed a large inferior myocardial infarction and in the setting of hemoptysis, decision was made to continue conservative therapy and forego catheterization as he would not be an ideal candidate for dual antiplatelet therapy at this time. He was maintained on aspirin, statin, beta blocker, ACE inhibitor, and Ranexa therapy. Long-acting nitrate therapy was also added.   Pulmonology was consulted related to hemoptysis and CT scan of the chest was performed revealing diffuse central alveolar opacity in the right lung with a somewhat peri-bronchovascular nodular distribution and peripheral basilar sparing. This was reviewed by pulmonology and felt to be most likely secondary to pulmonary edema leading to fractured capillary syndrome and hemoptysis.  2-D echocardiogram was performed on February 15, and revealed an EF 35-40% with grade 2 diastolic dysfunction and moderate mitral regurgitation. Mitral regurgitation was slightly worse than previous echo in August 2016 and this was felt to represent ischemic mitral regurgitation. Patient was diuresed with good response and symptomatically improvement. His beta blocker and ACE inhibitor doses have been titrated. He has not been having any recurrence of chest pain and hemoptysis has resolved. He will be discharged home today in good condition and we will arrange for follow-up appointment within the next 7 days.  We will consider a viability evaluation in the outpatient setting to determine if there is a large area of viable ischemic  myocardium and at that point, whether or not it would be appropriate to pursue catheterization plus or minus percutaneous intervention.  _____________  Discharge Vitals Blood pressure 126/61, pulse 71, temperature 98 F (36.7 C), temperature source Oral, resp. rate 18, height 5\' 8"  (1.727 m), weight 208 lb 14.4 oz (94.756 kg), SpO2 93 %.  Filed Weights   01/23/16 0318 01/23/16 1115 01/24/16 0442  Weight: 208 lb 8.9 oz (94.6 kg) 211 lb 14.4 oz (96.117 kg) 208 lb 14.4 oz (94.756 kg)    Labs & Radiologic Studies     CBC  Recent Labs  01/23/16 0303 01/24/16 0334  WBC 7.2 5.9  HGB 10.0* 9.7*  HCT 31.3* 30.6*  MCV 92.3 93.0  PLT 197 0000000   Basic Metabolic Panel  Recent Labs  01/22/16 0205 01/24/16 0334  NA 138 140  K 4.3 3.4*  CL 105 105  CO2 23 26  GLUCOSE 127* 128*  BUN 27* 26*  CREATININE 1.50* 1.54*  CALCIUM 9.0 8.9  MG 1.9  --   PHOS 3.0  --    Cardiac Enzymes  Recent Labs  01/21/16 2030 01/22/16 0205 01/22/16 1019  TROPONINI 26.28* 29.98* 19.89*   Dg Chest 2 View  01/20/2016  CLINICAL DATA:  Left-sided chest pain shortness of breath for 2 days EXAM: CHEST  2 VIEW COMPARISON:  04/24/2015 FINDINGS: Cardiac shadow is at the upper limits of normal in size. Some platelike atelectasis is noted in the left lung base which may be related to scarring. Mild vascular congestion is again seen with changes of interstitial edema. No focal confluent infiltrate  is seen. IMPRESSION: Mild CHF with interstitial edema. Electronically Signed   By: Inez Catalina M.D.   On: 01/20/2016 12:54   Ct Chest Wo Contrast  01/20/2016  CLINICAL DATA:  Pleural effusion on chest x-ray. EXAM: CT CHEST WITHOUT CONTRAST TECHNIQUE: Multidetector CT imaging of the chest was performed following the standard protocol without IV contrast. COMPARISON:  Chest x-ray earlier the same day. FINDINGS: Mediastinum / Lymph Nodes: There is no axillary lymphadenopathy. 9 mm right thyroid nodule noted. No mediastinal  lymphadenopathy. Calcified lymph nodes are seen in the subcarinal station and right hilum. The esophagus has normal imaging features. Heart size is upper normal. Coronary artery calcification is noted. Lungs / Pleura: There is central ground-glass attenuation involving the right upper, middle, and lower lobes with a central predominance. Scattered areas of interlobular septal thickening associated. There is relative sparing at the right lung base. Mild dependent atelectasis or airspace disease is seen in the left lower lobe. Small bilateral pleural effusions are noted. Upper Abdomen: Bilateral adrenal nodules measure in the 1-2 cm size range. These are incompletely visualized but where seen show average attenuation less than 10 Hounsfield units, most consistent with adenomas. MSK / Soft Tissues: Bone windows reveal no worrisome lytic or sclerotic osseous lesions. IMPRESSION: Diffuse central alveolar opacity in the right lung with a somewhat peribronchovascular nodular distribution and peripheral/ basilar sparing. Imaging features are probably related to diffuse right-sided pneumonia given the asymmetry although asymmetric pulmonary edema cannot be entirely excluded. Pulmonary hemorrhage would also be a consideration. Small bilateral pleural effusions. Bilateral adrenal nodules most consistent with adenomas. Electronically Signed   By: Misty Stanley M.D.   On: 01/20/2016 17:16    Disposition   Pt is being discharged home today in good condition.  Follow-up Plans & Appointments    Follow-up Information    Follow up with Dorothy Spark, MD In 1 week.   Specialty:  Cardiology   Why:  we will arrange for follow-up and contact you.   Contact information:   [address] [place] [address] [phone]        Discharge Medications   Current Discharge Medication List    START taking these medications   Details  furosemide (LASIX) 40 MG tablet Take 1 tablet (40 mg total) by  mouth daily. Qty: 30 tablet, Refills: 6    isosorbide mononitrate (IMDUR) 30 MG 24 hr tablet Take 0.5 tablets (15 mg total) by mouth daily. Qty: 30 tablet, Refills: 3    potassium chloride 20 MEQ TBCR Take 20 mEq by mouth daily. Qty: 30 tablet, Refills: 6      CONTINUE these medications which have CHANGED   Details  carvedilol (COREG) 6.25 MG tablet Take 1 tablet (6.25 mg total) by mouth 2 (two) times daily with a meal. Qty: 60 tablet, Refills: 6    ramipril (ALTACE) 5 MG capsule Take 1 capsule (5 mg total) by mouth daily. Qty: 30 capsule, Refills: 6      CONTINUE these medications which have NOT CHANGED   Details  acetaminophen (TYLENOL) 500 MG tablet Take 500 mg by mouth every 6 (six) hours as needed for moderate pain.    aspirin EC 81 MG tablet Take 1 tablet (81 mg total) by mouth daily. Qty: 90 tablet, Refills: 3   Associated Diagnoses: Chronic combined systolic and diastolic heart failure ([place]); Essential hypertension; Chronic systolic CHF (congestive heart failure) (HCC); HLD (hyperlipidemia)    atorvastatin (LIPITOR) 40 MG tablet Take 1  tablet (40 mg total) by mouth daily at 6 PM. Qty: 90 tablet, Refills: 3   Associated Diagnoses: Coronary artery disease involving native coronary artery of native heart without angina pectoris; Hyperlipidemia    Eluxadoline (VIBERZI) 75 MG TABS Take 75 mg by mouth daily.    ferrous sulfate 325 (65 FE) MG EC tablet Take twice a day with orange juice. Qty: 120 tablet, Refills: 3    multivitamin-iron-minerals-folic acid (CENTRUM) chewable tablet Chew 1 tablet by mouth daily.    ranolazine (RANEXA) 1000 MG SR tablet Take 1 tablet (1,000 mg total) by mouth 2 (two) times daily. Qty: 180 tablet, Refills: 3   Associated Diagnoses: Chronic combined systolic and diastolic heart failure ([place]); Essential hypertension; Chronic systolic CHF (congestive heart failure) (HCC); HLD (hyperlipidemia)    tamsulosin (FLOMAX) 0.4 MG CAPS capsule Take 0.4 mg  by mouth daily as needed (BPH).    vitamin B-12 (CYANOCOBALAMIN) 1000 MCG tablet Take 1,000 mcg by mouth daily.    nitroGLYCERIN (NITROSTAT) 0.4 MG SL tablet Place 1 tablet (0.4 mg total) under the tongue every 5 (five) minutes x 3 doses as needed for chest pain. Qty: 25 tablet, Refills: 12         Aspirin prescribed at discharge?  Yes High Intensity Statin Prescribed? (Lipitor 40-80mg  or Crestor 20-40mg ): Yes Beta Blocker Prescribed? Yes For EF 45% or less, Was ACEI/ARB Prescribed? Yes ADP Receptor Inhibitor Prescribed? (i.e. Plavix etc.-Includes Medically Managed Patients): No: Hemoptysis. For EF <40%, Aldosterone Inhibitor Prescribed? No - ckd, ACEI titrated during admission. Was EF assessed during THIS hospitalization? Yes Was Cardiac Rehab II ordered? (Included Medically managed Patients): Yes   Outstanding Labs/Studies   None  Duration of Discharge Encounter   Greater than 30 minutes including physician time.  Signed, Murray Hodgkins NP 01/24/2016, 1:30 PM

## 2016-01-25 ENCOUNTER — Telehealth: Payer: Self-pay | Admitting: Cardiology

## 2016-01-25 NOTE — Telephone Encounter (Signed)
Error

## 2016-01-25 NOTE — Telephone Encounter (Signed)
TOC   Appt scheduled with Cecilie Kicks  01/29/2016 @ 9:30a   Per staff Messages  Hi,  Mr. Ryman will need a 7 day toc and should ideally be seen by this Friday. He is a nelson pt.   Thanks,   Gerald Stabs

## 2016-01-25 NOTE — Telephone Encounter (Signed)
Patient contacted regarding discharge from [facility] on January 24, 2016.  Patient understands to follow up with provider Cecilie Kicks, NP on January 25, 2016 at 9:30AM at [facility]. Patient understands discharge instructions? yes Patient understands medications and regiment? yes Patient understands to bring all medications to this visit? yes

## 2016-01-28 ENCOUNTER — Emergency Department (HOSPITAL_COMMUNITY): Payer: PPO

## 2016-01-28 ENCOUNTER — Inpatient Hospital Stay (HOSPITAL_COMMUNITY)
Admission: EM | Admit: 2016-01-28 | Discharge: 2016-01-31 | DRG: 280 | Disposition: A | Discharge status: Home / Self Care | Payer: PPO | Attending: Internal Medicine | Admitting: Internal Medicine

## 2016-01-28 ENCOUNTER — Encounter (HOSPITAL_COMMUNITY): Payer: Self-pay | Admitting: Family Medicine

## 2016-01-28 ENCOUNTER — Observation Stay (HOSPITAL_COMMUNITY): Payer: PPO

## 2016-01-28 ENCOUNTER — Inpatient Hospital Stay (HOSPITAL_COMMUNITY): Payer: PPO

## 2016-01-28 ENCOUNTER — Telehealth: Payer: Self-pay | Admitting: Cardiology

## 2016-01-28 DIAGNOSIS — N401 Enlarged prostate with lower urinary tract symptoms: Secondary | ICD-10-CM

## 2016-01-28 DIAGNOSIS — I480 Paroxysmal atrial fibrillation: Secondary | ICD-10-CM | POA: Diagnosis not present

## 2016-01-28 DIAGNOSIS — R600 Localized edema: Secondary | ICD-10-CM | POA: Diagnosis not present

## 2016-01-28 DIAGNOSIS — I82431 Acute embolism and thrombosis of right popliteal vein: Secondary | ICD-10-CM | POA: Diagnosis not present

## 2016-01-28 DIAGNOSIS — R059 Cough, unspecified: Secondary | ICD-10-CM | POA: Diagnosis present

## 2016-01-28 DIAGNOSIS — E1122 Type 2 diabetes mellitus with diabetic chronic kidney disease: Secondary | ICD-10-CM | POA: Diagnosis present

## 2016-01-28 DIAGNOSIS — N179 Acute kidney failure, unspecified: Secondary | ICD-10-CM

## 2016-01-28 DIAGNOSIS — Z7982 Long term (current) use of aspirin: Secondary | ICD-10-CM | POA: Diagnosis not present

## 2016-01-28 DIAGNOSIS — I255 Ischemic cardiomyopathy: Secondary | ICD-10-CM | POA: Diagnosis present

## 2016-01-28 DIAGNOSIS — Z96652 Presence of left artificial knee joint: Secondary | ICD-10-CM | POA: Diagnosis not present

## 2016-01-28 DIAGNOSIS — I5042 Chronic combined systolic (congestive) and diastolic (congestive) heart failure: Secondary | ICD-10-CM | POA: Diagnosis not present

## 2016-01-28 DIAGNOSIS — I340 Nonrheumatic mitral (valve) insufficiency: Secondary | ICD-10-CM | POA: Diagnosis present

## 2016-01-28 DIAGNOSIS — I130 Hypertensive heart and chronic kidney disease with heart failure and stage 1 through stage 4 chronic kidney disease, or unspecified chronic kidney disease: Principal | ICD-10-CM | POA: Diagnosis present

## 2016-01-28 DIAGNOSIS — N184 Chronic kidney disease, stage 4 (severe): Secondary | ICD-10-CM | POA: Diagnosis present

## 2016-01-28 DIAGNOSIS — M7989 Other specified soft tissue disorders: Secondary | ICD-10-CM

## 2016-01-28 DIAGNOSIS — I252 Old myocardial infarction: Secondary | ICD-10-CM | POA: Diagnosis not present

## 2016-01-28 DIAGNOSIS — N138 Other obstructive and reflux uropathy: Secondary | ICD-10-CM | POA: Diagnosis present

## 2016-01-28 DIAGNOSIS — I5043 Acute on chronic combined systolic (congestive) and diastolic (congestive) heart failure: Secondary | ICD-10-CM | POA: Diagnosis not present

## 2016-01-28 DIAGNOSIS — E785 Hyperlipidemia, unspecified: Secondary | ICD-10-CM | POA: Diagnosis not present

## 2016-01-28 DIAGNOSIS — E669 Obesity, unspecified: Secondary | ICD-10-CM | POA: Diagnosis not present

## 2016-01-28 DIAGNOSIS — R05 Cough: Secondary | ICD-10-CM | POA: Diagnosis not present

## 2016-01-28 DIAGNOSIS — R0602 Shortness of breath: Secondary | ICD-10-CM | POA: Diagnosis present

## 2016-01-28 DIAGNOSIS — I2510 Atherosclerotic heart disease of native coronary artery without angina pectoris: Secondary | ICD-10-CM | POA: Diagnosis not present

## 2016-01-28 DIAGNOSIS — Z6830 Body mass index (BMI) 30.0-30.9, adult: Secondary | ICD-10-CM

## 2016-01-28 DIAGNOSIS — Z79899 Other long term (current) drug therapy: Secondary | ICD-10-CM

## 2016-01-28 DIAGNOSIS — I214 Non-ST elevation (NSTEMI) myocardial infarction: Secondary | ICD-10-CM | POA: Diagnosis present

## 2016-01-28 DIAGNOSIS — N189 Chronic kidney disease, unspecified: Secondary | ICD-10-CM

## 2016-01-28 DIAGNOSIS — N400 Enlarged prostate without lower urinary tract symptoms: Secondary | ICD-10-CM | POA: Diagnosis not present

## 2016-01-28 DIAGNOSIS — E1169 Type 2 diabetes mellitus with other specified complication: Secondary | ICD-10-CM

## 2016-01-28 DIAGNOSIS — I10 Essential (primary) hypertension: Secondary | ICD-10-CM | POA: Diagnosis not present

## 2016-01-28 LAB — INFLUENZA PANEL BY PCR (TYPE A & B)
H1N1 flu by pcr: NOT DETECTED
INFLAPCR: NEGATIVE
Influenza B By PCR: NEGATIVE

## 2016-01-28 LAB — CBC
HEMATOCRIT: 32.7 % — AB (ref 39.0–52.0)
HEMOGLOBIN: 10.2 g/dL — AB (ref 13.0–17.0)
MCH: 29.1 pg (ref 26.0–34.0)
MCHC: 31.2 g/dL (ref 30.0–36.0)
MCV: 93.2 fL (ref 78.0–100.0)
Platelets: 248 10*3/uL (ref 150–400)
RBC: 3.51 MIL/uL — AB (ref 4.22–5.81)
RDW: 14.2 % (ref 11.5–15.5)
WBC: 9.2 10*3/uL (ref 4.0–10.5)

## 2016-01-28 LAB — I-STAT TROPONIN, ED: Troponin i, poc: 0.69 ng/mL (ref 0.00–0.08)

## 2016-01-28 LAB — BASIC METABOLIC PANEL
ANION GAP: 10 (ref 5–15)
BUN: 26 mg/dL — ABNORMAL HIGH (ref 6–20)
CHLORIDE: 106 mmol/L (ref 101–111)
CO2: 24 mmol/L (ref 22–32)
Calcium: 9.1 mg/dL (ref 8.9–10.3)
Creatinine, Ser: 2.1 mg/dL — ABNORMAL HIGH (ref 0.61–1.24)
GFR calc non Af Amer: 27 mL/min — ABNORMAL LOW (ref >60)
GFR, EST AFRICAN AMERICAN: 32 mL/min — AB (ref >60)
Glucose, Bld: 154 mg/dL — ABNORMAL HIGH (ref 65–99)
Potassium: 4.3 mmol/L (ref 3.5–5.1)
Sodium: 140 mmol/L (ref 135–145)

## 2016-01-28 LAB — BRAIN NATRIURETIC PEPTIDE: B NATRIURETIC PEPTIDE 5: 915.7 pg/mL — AB (ref 0.0–100.0)

## 2016-01-28 LAB — GLUCOSE, CAPILLARY: Glucose-Capillary: 136 mg/dL — ABNORMAL HIGH (ref 65–99)

## 2016-01-28 MED ORDER — CARVEDILOL 6.25 MG PO TABS
6.2500 mg | ORAL_TABLET | Freq: Two times a day (BID) | ORAL | Status: DC
  Administered 2016-01-29 – 2016-01-31 (×5): 6.25 mg via ORAL
  Filled 2016-01-28 (×5): qty 1

## 2016-01-28 MED ORDER — ACETAMINOPHEN 650 MG RE SUPP: 650.0000 mg | Freq: Four times a day (QID) | RECTAL | Status: DC | PRN

## 2016-01-28 MED ORDER — ELUXADOLINE 75 MG PO TABS: 75.0000 mg | ORAL_TABLET | Freq: Two times a day (BID) | ORAL | Status: DC | PRN

## 2016-01-28 MED ORDER — ACETAMINOPHEN 325 MG PO TABS
650.0000 mg | ORAL_TABLET | Freq: Four times a day (QID) | ORAL | Status: DC | PRN
Start: 2016-01-28 — End: 2016-01-31

## 2016-01-28 MED ORDER — HEPARIN (PORCINE) IN NACL 100-0.45 UNIT/ML-% IJ SOLN
1500.0000 [IU]/h | INTRAMUSCULAR | Status: DC
  Administered 2016-01-28: 1500 [IU]/h via INTRAVENOUS
  Filled 2016-01-28: qty 250

## 2016-01-28 MED ORDER — FUROSEMIDE 40 MG PO TABS: 40.0000 mg | ORAL_TABLET | Freq: Every day | ORAL | Status: DC

## 2016-01-28 MED ORDER — TAMSULOSIN HCL 0.4 MG PO CAPS
0.4000 mg | ORAL_CAPSULE | Freq: Every day | ORAL | Status: DC | PRN
  Administered 2016-01-29: 0.4 mg via ORAL
  Filled 2016-01-28: qty 1

## 2016-01-28 MED ORDER — HEPARIN BOLUS VIA INFUSION
3000.0000 [IU] | Freq: Once | INTRAVENOUS | Status: AC
  Administered 2016-01-28: 3000 [IU] via INTRAVENOUS
  Filled 2016-01-28: qty 3000

## 2016-01-28 MED ORDER — HYDROCODONE-HOMATROPINE 5-1.5 MG/5ML PO SYRP: 5.0000 mL | ORAL_SOLUTION | Freq: Four times a day (QID) | ORAL | Status: DC | PRN

## 2016-01-28 MED ORDER — ISOSORBIDE MONONITRATE ER 30 MG PO TB24
15.0000 mg | ORAL_TABLET | Freq: Every day | ORAL | Status: DC
  Administered 2016-01-29 – 2016-01-31 (×3): 15 mg via ORAL
  Filled 2016-01-28 (×3): qty 1

## 2016-01-28 MED ORDER — HEPARIN SODIUM (PORCINE) 5000 UNIT/ML IJ SOLN: 4000.0000 [IU] | Freq: Once | INTRAMUSCULAR | Status: DC

## 2016-01-28 MED ORDER — TECHNETIUM TO 99M ALBUMIN AGGREGATED
4.1000 | Freq: Once | INTRAVENOUS | Status: AC | PRN
  Administered 2016-01-28: 4 via INTRAVENOUS

## 2016-01-28 MED ORDER — HEPARIN SODIUM (PORCINE) 5000 UNIT/ML IJ SOLN
5000.0000 [IU] | Freq: Three times a day (TID) | INTRAMUSCULAR | Status: DC
  Administered 2016-01-29 – 2016-01-31 (×8): 5000 [IU] via SUBCUTANEOUS
  Filled 2016-01-28 (×9): qty 1

## 2016-01-28 MED ORDER — ASPIRIN EC 81 MG PO TBEC
81.0000 mg | DELAYED_RELEASE_TABLET | Freq: Every day | ORAL | Status: DC
  Administered 2016-01-29 – 2016-01-31 (×3): 81 mg via ORAL
  Filled 2016-01-28 (×3): qty 1

## 2016-01-28 MED ORDER — FUROSEMIDE 10 MG/ML IJ SOLN
40.0000 mg | Freq: Once | INTRAMUSCULAR | Status: AC
  Administered 2016-01-28: 40 mg via INTRAVENOUS
  Filled 2016-01-28: qty 4

## 2016-01-28 MED ORDER — DEXTROSE 5 % IV SOLN
1.0000 g | INTRAVENOUS | Status: DC
  Administered 2016-01-28 – 2016-01-30 (×3): 1 g via INTRAVENOUS
  Filled 2016-01-28 (×4): qty 10

## 2016-01-28 MED ORDER — ADULT MULTIVITAMIN W/MINERALS CH
1.0000 | ORAL_TABLET | Freq: Every day | ORAL | Status: DC
  Administered 2016-01-28 – 2016-01-31 (×4): 1 via ORAL
  Filled 2016-01-28 (×4): qty 1

## 2016-01-28 MED ORDER — DEXTROSE 5 % IV SOLN
500.0000 mg | Freq: Once | INTRAVENOUS | Status: AC
  Administered 2016-01-28: 500 mg via INTRAVENOUS
  Filled 2016-01-28: qty 500

## 2016-01-28 MED ORDER — NITROGLYCERIN 0.4 MG SL SUBL: 0.4000 mg | SUBLINGUAL_TABLET | SUBLINGUAL | Status: DC | PRN

## 2016-01-28 MED ORDER — VITAMIN B-12 1000 MCG PO TABS
1000.0000 ug | ORAL_TABLET | Freq: Every day | ORAL | Status: DC
  Administered 2016-01-29 – 2016-01-31 (×3): 1000 ug via ORAL
  Filled 2016-01-28 (×3): qty 1

## 2016-01-28 MED ORDER — ATORVASTATIN CALCIUM 40 MG PO TABS
40.0000 mg | ORAL_TABLET | Freq: Every day | ORAL | Status: DC
  Administered 2016-01-28 – 2016-01-30 (×3): 40 mg via ORAL
  Filled 2016-01-28 (×4): qty 1

## 2016-01-28 MED ORDER — RANOLAZINE ER 500 MG PO TB12
1000.0000 mg | ORAL_TABLET | Freq: Two times a day (BID) | ORAL | Status: DC
  Administered 2016-01-28 – 2016-01-31 (×6): 1000 mg via ORAL
  Filled 2016-01-28 (×6): qty 2

## 2016-01-28 MED ORDER — TECHNETIUM TC 99M DIETHYLENETRIAME-PENTAACETIC ACID: 30.1000 | Freq: Once | INTRAVENOUS | Status: DC | PRN

## 2016-01-28 MED ORDER — FERROUS SULFATE 325 (65 FE) MG PO TABS
325.0000 mg | ORAL_TABLET | Freq: Every evening | ORAL | Status: DC
  Administered 2016-01-28 – 2016-01-30 (×3): 325 mg via ORAL
  Filled 2016-01-28 (×3): qty 1

## 2016-01-28 NOTE — Progress Notes (Signed)
Venous Doppler significant for distal right peroneal veins are noncompressible, consistent with with DVT, discussed with vascular surgery, usually no indication for anticoagulation, the plan usually to repeat venous Doppler and 1 week to ensure no progression of DVT. Phillips Climes MD

## 2016-01-28 NOTE — Consult Note (Signed)
ANTICOAGULATION CONSULT NOTE - Initial Consult  Pharmacy Consult for heparin Indication: pulmonary embolus  Patient Measurements:   Heparin Dosing Weight: 88.3 kg  Vital Signs: Temp: [name].7 F (36.5 C) (02/23 1112) Temp Source: Oral (02/23 1112) BP: 112/64 mmHg (02/23 1215) Pulse Rate: 77 (02/23 1215)  Labs:  Recent Labs  01/28/16 1131  HGB 10.2*  HCT 32.7*  PLT 248  CREATININE 2.10*    Estimated Creatinine Clearance: 29.8 mL/min (by C-G formula based on Cr of 2.1).   Medical History: Past Medical History  Diagnosis Date  . HLD (hyperlipidemia)   . Type II diabetes mellitus ([place])   . Paroxysmal atrial fibrillation (HCC)     a. diagnosed on 11/2014 admission. Spontaneously converted to NSR. Placed on Eliquis;  b. 12/2015 Eliquis d/c'd 2/2 anemia and nosebleeds.  . Chronic combined systolic and diastolic CHF (congestive heart failure) ([place])     a. 11/2014 Echo: EF 40-45%, akinesis of the inferior, inferolateral and inferoseptal walls; overall mild to moderate reduction in LV function; grade 2 diastolic dysfunction; severe LAE; trace AI; moderate MR; mild TR with severely elevated pulmonary pressures. b. EF 25% in 04/2015, possibly due to HTN crisis;  c. Echo 8/16:  Mild LVH, EF 50-55%; d. 01/2016 Echo: EF 35-40%, Gr2 DD, mod MR, sev dil LA  . CAD (coronary artery disease)     a. s/p LHC on 11/24/14 w/ mild-mod dz in LAD. CTO of mLCx and OM2 with R-->L and L-->L collaterals. b. Elevated trop 04/2015 felt due to demand ischemia; c. OOH NSTEMI->conservatively managed.  . Renal insufficiency     a. Elev Cr 04/2015 requiring reduction in Eliquis.  . Obesity   . Hypertensive heart disease   . Moderate mitral regurgitation     a. 11/2014 Echo: Mod MR; b. 07/2015 Echo: Mild MR;  c. 01/2016 Echo: Mod MR - presumed to be ischemic in setting of OOH MI.  . Ischemic cardiomyopathy     a. 01/2016 Echo: EF 35-40%.  . Hemoptysis     a. 01/2016 in setting of OOH MI/pulmonary edema-->resolved  prior to d/c.    Assessment: 82 yo [name] with suspected PE who was recently discharged on 2/19 after hospitalization for hemoptysis secondary to pulmonary edema. Pt was previously on Eliquis, it was discontinued in 12/2015 due to anemia and nosebleeds.   Goal of Therapy:  Heparin level 0.3-0.7 units/ml Monitor platelets by anticoagulation protocol: Yes   Plan:  Give 3000 units bolus x 1 Start heparin infusion at 1500 units/hr Check anti-Xa level in 8 hours and daily while on heparin Continue to monitor H&H and platelets  Joya San, PharmD Clinical Pharmacy Resident Pager # [phone] 01/28/2016 1:44 PM

## 2016-01-28 NOTE — H&P (Signed)
Patient Demographics  [name] [name], is a 82 y.o. male  MRN: [id number]   DOB - [birth year]  Admit Date - 01/28/2016  Outpatient Primary MD for the patient is Stephens Shire, MD   With History of -  Past Medical History  Diagnosis Date  . HLD (hyperlipidemia)   . Type II diabetes mellitus ([place])   . Paroxysmal atrial fibrillation (HCC)     a. diagnosed on 11/2014 admission. Spontaneously converted to NSR. Placed on Eliquis;  b. 12/2015 Eliquis d/c'd 2/2 anemia and nosebleeds.  . Chronic combined systolic and diastolic CHF (congestive heart failure) ([place])     a. 11/2014 Echo: EF 40-45%, akinesis of the inferior, inferolateral and inferoseptal walls; overall mild to moderate reduction in LV function; grade 2 diastolic dysfunction; severe LAE; trace AI; moderate MR; mild TR with severely elevated pulmonary pressures. b. EF 25% in 04/2015, possibly due to HTN crisis;  c. Echo 8/16:  Mild LVH, EF 50-55%; d. 01/2016 Echo: EF 35-40%, Gr2 DD, mod MR, sev dil LA  . CAD (coronary artery disease)     a. s/p LHC on 11/24/14 w/ mild-mod dz in LAD. CTO of mLCx and OM2 with R-->L and L-->L collaterals. b. Elevated trop 04/2015 felt due to demand ischemia; c. OOH NSTEMI->conservatively managed.  . Renal insufficiency     a. Elev Cr 04/2015 requiring reduction in Eliquis.  . Obesity   . Hypertensive heart disease   . Moderate mitral regurgitation     a. 11/2014 Echo: Mod MR; b. 07/2015 Echo: Mild MR;  c. 01/2016 Echo: Mod MR - presumed to be ischemic in setting of OOH MI.  . Ischemic cardiomyopathy     a. 01/2016 Echo: EF 35-40%.  . Hemoptysis     a. 01/2016 in setting of OOH MI/pulmonary edema-->resolved prior to d/c.      Past Surgical History  Procedure Laterality Date  . Appendectomy    . Total knee arthroplasty Left 2013  . Tonsillectomy    . Joint replacement    . Cardiac catheterization  11/24/2014  . Left heart catheterization with coronary angiogram N/A 11/24/2014    Procedure: LEFT HEART  CATHETERIZATION WITH CORONARY ANGIOGRAM;  Surgeon: Jettie Booze, MD;  Location: [facility] CATH LAB;  Service: Cardiovascular;  Laterality: N/A;  . Esophagogastroduodenoscopy (egd) with propofol N/A 07/16/2015    Procedure: ESOPHAGOGASTRODUODENOSCOPY (EGD) WITH PROPOFOL;  Surgeon: Carol Ada, MD;  Location: [facility] ENDOSCOPY;  Service: Endoscopy;  Laterality: N/A;  . Colonoscopy with propofol N/A 07/16/2015    Procedure: COLONOSCOPY WITH PROPOFOL;  Surgeon: Carol Ada, MD;  Location: [facility] ENDOSCOPY;  Service: Endoscopy;  Laterality: N/A;    in for   Chief Complaint  Patient presents with  . Cough     HPI  [name] [name]  is a 82 y.o. male, with a past medical history significant for extensive coronary artery disease, recent admission to [name] for STEMI  under cardiology service, ,ischemic cardiomyopathy (EF recently 35-40% by echo) , chronic combined systolic and diastolic heart failure, paroxysmal atrial fibrillation, chronic kidney disease stage III-IV, obesity, hypertension, type 2 diabetes mellitus presents secondary to complaints of cough, productive with green phlegm, and worsening lower extremity edema, patient recent hospitalization from 2/15> 2/19, seen by cardiology, no PCI given his renal disease, and hemoptysis, patient presents today with complaints of cough over last 48 hours, became productive yesterday, with green sputum, chest x-ray with no acute findings, as well reports worsening lower extremity edema, patient denies any chest  pain, any shortness of breath, any dyspnea, denies any hematemesis, patient reports fever 100.4 yesterday at home ,I was called to admit.    Review of Systems    In addition to the HPI above,  Fever 100.4 at home, No Headache, No changes with Vision or hearing, No problems swallowing food or Liquids, No Chest pain,or Shortness of Breath, reports cough, with green sputum. No Abdominal pain, No Nausea or Vommitting, Bowel movements are regular, No  Blood in stool or Urine, No dysuria, No new skin rashes or bruises, No new joints pains-aches,  No new weakness, tingling, numbness in any extremity, No recent weight gain or loss, No polyuria, polydypsia or polyphagia, No significant Mental Stressors.  A full 10 point Review of Systems was done, except as stated above, all other Review of Systems were negative.   Social History Social History  Substance Use Topics  . Smoking status: Never Smoker   . Smokeless tobacco: Never Used  . Alcohol Use: 8.4 oz/week    7 Shots of liquor, 7 Standard drinks or equivalent per week     Comment: Daily     Family History Family History  Problem Relation Age of Onset  . Cancer Father 22    Deceased  . Stroke Mother     Deceased  . Heart attack Brother     Deceased. Had cath with stents followed by PE  . Hypertension Neg Hx   . Heart disease Brother   . Heart disease Sister   . Heart disease Sister      Prior to Admission medications   Medication Sig Start Date End Date Taking? Authorizing Provider  aspirin EC 81 MG tablet Take 1 tablet (81 mg total) by mouth daily. 12/10/15  Yes Dorothy Spark, MD  atorvastatin (LIPITOR) 40 MG tablet Take 1 tablet (40 mg total) by mouth daily at 6 PM. 04/29/15  Yes Scott T Kathlen Mody, PA-C  carvedilol (COREG) 6.25 MG tablet Take 1 tablet (6.25 mg total) by mouth 2 (two) times daily with a meal. 01/24/16  Yes Rogelia Mire, NP  Eluxadoline (VIBERZI) 75 MG TABS Take 75 mg by mouth 2 (two) times daily as needed (diarhea).    Yes Historical Provider, MD  ferrous sulfate 325 (65 FE) MG EC tablet Take twice a day with orange juice. Patient taking differently: Take 325 mg by mouth every evening. Take twice a day with orange juice. 08/18/15  Yes Wyatt Portela, MD  furosemide (LASIX) 40 MG tablet Take 1 tablet (40 mg total) by mouth daily. 01/24/16  Yes Rogelia Mire, NP  HYDROcodone-homatropine [facility]) 5-1.5 MG/5ML syrup Take 5 mLs by mouth every 6  (six) hours as needed for cough.   Yes Historical Provider, MD  isosorbide mononitrate (IMDUR) 30 MG 24 hr tablet Take 0.5 tablets (15 mg total) by mouth daily. 01/24/16  Yes Rogelia Mire, NP  multivitamin-iron-minerals-folic acid (CENTRUM) chewable tablet Chew 1 tablet by mouth daily.   Yes Historical Provider, MD  nitroGLYCERIN (NITROSTAT) 0.4 MG SL tablet Place 1 tablet (0.4 mg total) under the tongue every 5 (five) minutes x 3 doses as needed for chest pain. 11/26/14  Yes Eileen Stanford, PA-C  potassium chloride 20 MEQ TBCR Take 20 mEq by mouth daily. 01/24/16  Yes Rogelia Mire, NP  ramipril (ALTACE) 5 MG capsule Take 1 capsule (5 mg total) by mouth daily. 01/24/16  Yes Rogelia Mire, NP  ranolazine (RANEXA) 1000 MG SR tablet Take 1 tablet (  1,000 mg total) by mouth 2 (two) times daily. 12/10/15  Yes Dorothy Spark, MD  tamsulosin (FLOMAX) 0.4 MG CAPS capsule Take 0.4 mg by mouth daily as needed (BPH).   Yes Historical Provider, MD  vitamin B-12 (CYANOCOBALAMIN) 1000 MCG tablet Take 1,000 mcg by mouth daily.   Yes Historical Provider, MD    Allergies  Allergen Reactions  . Lisinopril     cough    Physical Exam  Vitals  Blood pressure 134/73, pulse 86, temperature 97.7 F (36.5 C), temperature source Oral, resp. rate 20, height 5\' 8"  (1.727 m), weight 94.8 kg (208 lb 15.9 oz), SpO2 96 %.   1. General well-nourished male lying in bed in NAD,   2. Normal affect and insight, Not Suicidal or Homicidal, Awake Alert, Oriented X 3.  3. No F.N deficits, ALL C.Nerves Intact, Strength 5/5 all 4 extremities, Sensation intact all 4 extremities, Plantars down going.  4. Ears and Eyes appear Normal, Conjunctivae clear, PERRLA. Moist Oral Mucosa.  5. Supple Neck, No JVD, No cervical lymphadenopathy appriciated, No Carotid Bruits.  6. Symmetrical Chest wall movement, Good air movement bilaterally, CTAB.  7. RRR, No Gallops, Rubs , No Parasternal Heave, +3 lower extremity  edema.  8. Positive Bowel Sounds, Abdomen Soft, No tenderness, No organomegaly appriciated,No rebound -guarding or rigidity.  9.  No Cyanosis, Normal Skin Turgor, No Skin Rash or Bruise.  10. Good muscle tone,  joints appear normal , no effusions, Normal ROM.  11. No Palpable Lymph Nodes in Neck or Axillae    Data Review  CBC  Recent Labs Lab 01/22/16 0205 01/23/16 0303 01/24/16 0334 01/28/16 1131  WBC 9.3 7.2 5.9 9.2  HGB 9.5* 10.0* 9.7* 10.2*  HCT 29.9* 31.3* 30.6* 32.7*  PLT 168 197 204 248  MCV 92.6 92.3 93.0 93.2  MCH 29.4 29.5 29.5 29.1  MCHC 31.8 31.9 31.7 31.2  RDW 14.5 14.3 14.4 14.2   ------------------------------------------------------------------------------------------------------------------  Chemistries   Recent Labs Lab 01/22/16 0205 01/24/16 0334 01/28/16 1131  NA 138 140 140  K 4.3 3.4* 4.3  CL 105 105 106  CO2 23 26 24   GLUCOSE 127* 128* 154*  BUN 27* 26* 26*  CREATININE 1.50* 1.54* 2.10*  CALCIUM 9.0 8.9 9.1  MG 1.9  --   --    ------------------------------------------------------------------------------------------------------------------ estimated creatinine clearance is 29.8 mL/min (by C-G formula based on Cr of 2.1). ------------------------------------------------------------------------------------------------------------------ No results for input(s): TSH, T4TOTAL, T3FREE, THYROIDAB in the last 72 hours.  Invalid input(s): FREET3   Coagulation profile No results for input(s): INR, PROTIME in the last 168 hours. ------------------------------------------------------------------------------------------------------------------- No results for input(s): DDIMER in the last 72 hours. -------------------------------------------------------------------------------------------------------------------  Cardiac Enzymes  Recent Labs Lab 01/21/16 2030 01/22/16 0205 01/22/16 1019  TROPONINI 26.28* 29.98* 19.89*    ------------------------------------------------------------------------------------------------------------------ Invalid input(s): POCBNP   ---------------------------------------------------------------------------------------------------------------  Urinalysis    Component Value Date/Time   COLORURINE YELLOW 04/23/2015 1220   APPEARANCEUR TURBID* 04/23/2015 1220   LABSPEC 1.016 04/23/2015 1220   PHURINE 5.0 04/23/2015 1220   GLUCOSEU 250* 04/23/2015 1220   HGBUR LARGE* 04/23/2015 1220   BILIRUBINUR NEGATIVE 04/23/2015 1220   KETONESUR NEGATIVE 04/23/2015 1220   PROTEINUR 100* 04/23/2015 1220   UROBILINOGEN 1.0 04/23/2015 1220   NITRITE NEGATIVE 04/23/2015 1220   LEUKOCYTESUR LARGE* 04/23/2015 1220    ----------------------------------------------------------------------------------------------------------------  Imaging results:   Dg Chest 2 View  01/28/2016  CLINICAL DATA:  Cough with weakness and shortness of breath for 2 days. EXAM: CHEST  2 VIEW  COMPARISON:  CT and radiographs 01/20/2016. FINDINGS: The heart size and mediastinal contours are stable. There are calcified right hilar lymph nodes. The asymmetric patchy airspace opacities within the right lung on the prior examination have improved. Residual interstitial prominence appears chronic. There are persistent bilateral pleural effusions. Right lung granuloma noted. The bones are unchanged. IMPRESSION: Interval resolution of asymmetric right-sided airspace disease demonstrated on radiographs done 8 days ago. Persistent bilateral pleural effusions with evidence of underlying granulomatous disease. Electronically Signed   By: Richardean Sale M.D.   On: 01/28/2016 11:53      Assessment & Plan  Active Problems:   History of non-ST elevation myocardial infarction (NSTEMI)   Essential hypertension   Diabetes mellitus type 2 in obese (HCC)   Hyperlipidemia   Chronic combined systolic and diastolic heart failure  (HCC)   Benign prostatic hyperplasia with urinary obstruction   Shortness of breath   Cough   Acute on chronic kidney failure (HCC)   Cough - Patient presents with cough, productive, low-grade fever at home, most likely related to acute bronchitis versus viral illness, will start on IV azithromycin and Rocephin, will check influenza panel. - Patient G list mentioned lisinopril causing cough, she was started on ramipril, which we will hold secondary to acute renal failure as well.  Coronary artery disease disease with recent non-STEMI - We'll continue patient on Coreg, aspirin, statin, Imdur and Ranexa, he is on ramipril at home which will be on on hold given renal failure and cough to ACE inhibitor, will consider BiDil if indicated. [facility] consult cardiology  Lower extremity edema - Patient recent echo showing EF 35-40%, most likely due to acute systolic heart failure, but patient does not appear to be in respiratory distress, so will give one dose of IV diuresis, then resume home dose will monitor renal function closely. - We will discontinue heparin pending VQ scan and lower extremity venous Dopplers.  Chronic combined systolic and diastolic CHF - Patient appears to be in mild volume overload with lower extremity edema, continue with beta blockers, hold ramipril secondary to renal failure and cough, monitor closely on diuresis  Acute on chronic renal failure - Patient with baseline chronic kidney disease stage III, currently worsening, will discontinue ramipril, will monitor closely on diuresis.  BPH - Continue with Flomax  Hypertension -Resume home medication  Diabetes mellitus - Does not appear to be on any home medication, will monitor CBGs.   DVT Prophylaxis [place] Heparin   AM Labs Ordered, also please review Full Orders  Family Communication: Admission, patients condition and plan of care including tests being ordered have been discussed with the patient and wife who  indicate understanding and agree with the plan and Code Status.  Code Status Full  Likely DC to  Home when stable  Condition GUARDED    Time spent in minutes : 65 minutes    Kortlynn Poust M.D on 01/28/2016 at 2:59 PM  Between 7am to 7pm - Pager - [phone]  After 7pm go to [email or web address] - password [email or web address]  And look for the night coverage person covering me after hours  [facility] Office  [phone]

## 2016-01-28 NOTE — Telephone Encounter (Signed)
Called the pt and wife back to report that I spoke with our DOD Dr Mare Ferrari, and per Dr Mare Ferrari, the pt should report to [facility] now, to be further worked-up and assessed for pneumonia. Advised the pts wife that given his extensive cardiac hx, and recent hospitalizations, and verbalized complaints from last night, the safest thing to do is to take the pt to [facility] ER now to be evaluated for infection/pneumonia, and will need a chest xray and proper labs done.  Per the pts wife she verbalized understanding and completely agrees with this plan.  Per the wife, she did state "now that you mention, he is running a bit of a low-grade fever." Informed the wife that I will route this message to Dr Meda Coffee to make her aware that the pt will be in route to [facility] ER for complaints mentioned above.  Wife gracious for all the assistance provided.

## 2016-01-28 NOTE — ED Notes (Signed)
Report attempted 

## 2016-01-28 NOTE — ED Notes (Signed)
Pt here for for cough with productive sputum. sts swelling in BLE. Sent here for r/o PNA vs HF

## 2016-01-28 NOTE — Telephone Encounter (Signed)
New Message  Pt wife calling to speak w/ RN- stated that pt had "bad night, last night" would not go into detail. Please call back and discuss.

## 2016-01-28 NOTE — Progress Notes (Signed)
This encounter was created in error - please disregard. This encounter was created in error - please disregard. This encounter was created in error - please disregard. 

## 2016-01-28 NOTE — ED Notes (Signed)
Meal tray ordered 

## 2016-01-28 NOTE — ED Provider Notes (Signed)
CSN: [id number]     Arrival date & time 01/28/16  1053 History   First MD Initiated Contact with Patient 01/28/16 1206     Chief Complaint  Patient presents with  . Cough   (Consider location/radiation/quality/duration/timing/severity/associated sxs/prior Treatment) HPI 82 y.o. male with a hx of recent NSTEMI 01-20-16, STEMI Dec 2015, CHF Combined Systolic/Diastolic, CKD IV, Obesity, HTN, DM II, presents to the Emergency Department today complaining of cough since last night. Seen recently for NSTEMI as well as hemoptysis on 01-20-16 and admitted. Evaluated by cardiology and pulmonology. Decided against PCI due to risk of pulm hemorrhage and possible risk of worsening AKI. Told to follow up outpatient tomorrow for further management. Patient had cough last night with PND (using several pillows). Does not use home O2. Notes BLE swelling as well that has worsened. Called nurse navigator who told them to come here for evaluation of possible pneumonia. Pt having no fevers. No CP/SOB/ABD pain. No N/V/D. No hemoptysis.   Takes Lasix 40mg  BID     Past Medical History  Diagnosis Date  . HLD (hyperlipidemia)   . Type II diabetes mellitus ([place])   . Paroxysmal atrial fibrillation (HCC)     a. diagnosed on 11/2014 admission. Spontaneously converted to NSR. Placed on Eliquis;  b. 12/2015 Eliquis d/c'd 2/2 anemia and nosebleeds.  . Chronic combined systolic and diastolic CHF (congestive heart failure) ([place])     a. 11/2014 Echo: EF 40-45%, akinesis of the inferior, inferolateral and inferoseptal walls; overall mild to moderate reduction in LV function; grade 2 diastolic dysfunction; severe LAE; trace AI; moderate MR; mild TR with severely elevated pulmonary pressures. b. EF 25% in 04/2015, possibly due to HTN crisis;  c. Echo 8/16:  Mild LVH, EF 50-55%; d. 01/2016 Echo: EF 35-40%, Gr2 DD, mod MR, sev dil LA  . CAD (coronary artery disease)     a. s/p LHC on 11/24/14 w/ mild-mod dz in LAD. CTO of mLCx and OM2 with  R-->L and L-->L collaterals. b. Elevated trop 04/2015 felt due to demand ischemia; c. OOH NSTEMI->conservatively managed.  . Renal insufficiency     a. Elev Cr 04/2015 requiring reduction in Eliquis.  . Obesity   . Hypertensive heart disease   . Moderate mitral regurgitation     a. 11/2014 Echo: Mod MR; b. 07/2015 Echo: Mild MR;  c. 01/2016 Echo: Mod MR - presumed to be ischemic in setting of OOH MI.  . Ischemic cardiomyopathy     a. 01/2016 Echo: EF 35-40%.  . Hemoptysis     a. 01/2016 in setting of OOH MI/pulmonary edema-->resolved prior to d/c.   Past Surgical History  Procedure Laterality Date  . Appendectomy    . Total knee arthroplasty Left 2013  . Tonsillectomy    . Joint replacement    . Cardiac catheterization  11/24/2014  . Left heart catheterization with coronary angiogram N/A 11/24/2014    Procedure: LEFT HEART CATHETERIZATION WITH CORONARY ANGIOGRAM;  Surgeon: Jettie Booze, MD;  Location: [facility] CATH LAB;  Service: Cardiovascular;  Laterality: N/A;  . Esophagogastroduodenoscopy (egd) with propofol N/A 07/16/2015    Procedure: ESOPHAGOGASTRODUODENOSCOPY (EGD) WITH PROPOFOL;  Surgeon: Carol Ada, MD;  Location: [facility] ENDOSCOPY;  Service: Endoscopy;  Laterality: N/A;  . Colonoscopy with propofol N/A 07/16/2015    Procedure: COLONOSCOPY WITH PROPOFOL;  Surgeon: Carol Ada, MD;  Location: [facility] ENDOSCOPY;  Service: Endoscopy;  Laterality: N/A;   Family History  Problem Relation Age of Onset  . Cancer Father 70  Deceased  . Stroke Mother     Deceased  . Heart attack Brother     Deceased. Had cath with stents followed by PE  . Hypertension Neg Hx   . Heart disease Brother   . Heart disease Sister   . Heart disease Sister    Social History  Substance Use Topics  . Smoking status: Never Smoker   . Smokeless tobacco: Never Used  . Alcohol Use: 8.4 oz/week    7 Shots of liquor, 7 Standard drinks or equivalent per week     Comment: Daily    Review of Systems ROS reviewed  and all are negative for acute change except as noted in the HPI.  Allergies  Lisinopril  Home Medications   Prior to Admission medications   Medication Sig Start Date End Date Taking? Authorizing Provider  aspirin EC 81 MG tablet Take 1 tablet (81 mg total) by mouth daily. 12/10/15  Yes Dorothy Spark, MD  atorvastatin (LIPITOR) 40 MG tablet Take 1 tablet (40 mg total) by mouth daily at 6 PM. 04/29/15  Yes Scott T Kathlen Mody, PA-C  carvedilol (COREG) 6.25 MG tablet Take 1 tablet (6.25 mg total) by mouth 2 (two) times daily with a meal. 01/24/16  Yes Rogelia Mire, NP  Eluxadoline (VIBERZI) 75 MG TABS Take 75 mg by mouth 2 (two) times daily as needed (diarhea).    Yes Historical Provider, MD  ferrous sulfate 325 (65 FE) MG EC tablet Take twice a day with orange juice. Patient taking differently: Take 325 mg by mouth every evening. Take twice a day with orange juice. 08/18/15  Yes Wyatt Portela, MD  furosemide (LASIX) 40 MG tablet Take 1 tablet (40 mg total) by mouth daily. 01/24/16  Yes Rogelia Mire, NP  HYDROcodone-homatropine [facility]) 5-1.5 MG/5ML syrup Take 5 mLs by mouth every 6 (six) hours as needed for cough.   Yes Historical Provider, MD  isosorbide mononitrate (IMDUR) 30 MG 24 hr tablet Take 0.5 tablets (15 mg total) by mouth daily. 01/24/16  Yes Rogelia Mire, NP  multivitamin-iron-minerals-folic acid (CENTRUM) chewable tablet Chew 1 tablet by mouth daily.   Yes Historical Provider, MD  nitroGLYCERIN (NITROSTAT) 0.4 MG SL tablet Place 1 tablet (0.4 mg total) under the tongue every 5 (five) minutes x 3 doses as needed for chest pain. 11/26/14  Yes Eileen Stanford, PA-C  potassium chloride 20 MEQ TBCR Take 20 mEq by mouth daily. 01/24/16  Yes Rogelia Mire, NP  ramipril (ALTACE) 5 MG capsule Take 1 capsule (5 mg total) by mouth daily. 01/24/16  Yes Rogelia Mire, NP  ranolazine (RANEXA) 1000 MG SR tablet Take 1 tablet (1,000 mg total) by mouth 2 (two) times  daily. 12/10/15  Yes Dorothy Spark, MD  tamsulosin (FLOMAX) 0.4 MG CAPS capsule Take 0.4 mg by mouth daily as needed (BPH).   Yes Historical Provider, MD  vitamin B-12 (CYANOCOBALAMIN) 1000 MCG tablet Take 1,000 mcg by mouth daily.   Yes Historical Provider, MD   BP 112/64 mmHg  Pulse 77  Temp(Src) 97.7 F (36.5 C) (Oral)  Resp 16  SpO2 92%   Physical Exam  Constitutional: He is oriented to person, place, and time. He appears well-developed and well-nourished.  HENT:  Head: Normocephalic and atraumatic.  Eyes: EOM are normal. Pupils are equal, round, and reactive to light.  Neck: Trachea normal and normal range of motion. Neck supple.  Cardiovascular: Normal rate, regular rhythm and normal heart sounds.  No murmur heard. Pulmonary/Chest: Effort normal and breath sounds normal. He has no decreased breath sounds. He has no wheezes. He has no rhonchi. He has no rales. He exhibits no tenderness.  Abdominal: Soft. Normal appearance and bowel sounds are normal. There is no tenderness.  Musculoskeletal: Normal range of motion.  3+ pitting edema BLE. Distal pulses intact. Motor/sensation intact. Right leg appears larger than left with increased swelling.   Neurological: He is alert and oriented to person, place, and time.  Skin: Skin is warm and dry.  Psychiatric: He has a normal mood and affect. His behavior is normal. Thought content normal.  Nursing note and vitals reviewed.  ED Course  Procedures (including critical care time) Labs Review Labs Reviewed  BASIC METABOLIC PANEL - Abnormal; Notable for the following:    Glucose, Bld 154 (*)    BUN 26 (*)    Creatinine, Ser 2.10 (*)    GFR calc non Af Amer 27 (*)    GFR calc Af Amer 32 (*)    All other components within normal limits  CBC - Abnormal; Notable for the following:    RBC 3.51 (*)    Hemoglobin 10.2 (*)    HCT 32.7 (*)    All other components within normal limits  BRAIN NATRIURETIC PEPTIDE - Abnormal; Notable for the  following:    B Natriuretic Peptide 915.7 (*)    All other components within normal limits  I-STAT TROPOININ, ED - Abnormal; Notable for the following:    Troponin i, poc 0.69 (*)    All other components within normal limits  HEPARIN LEVEL (UNFRACTIONATED)   Imaging Review Dg Chest 2 View  01/28/2016  CLINICAL DATA:  Cough with weakness and shortness of breath for 2 days. EXAM: CHEST  2 VIEW COMPARISON:  CT and radiographs 01/20/2016. FINDINGS: The heart size and mediastinal contours are stable. There are calcified right hilar lymph nodes. The asymmetric patchy airspace opacities within the right lung on the prior examination have improved. Residual interstitial prominence appears chronic. There are persistent bilateral pleural effusions. Right lung granuloma noted. The bones are unchanged. IMPRESSION: Interval resolution of asymmetric right-sided airspace disease demonstrated on radiographs done 8 days ago. Persistent bilateral pleural effusions with evidence of underlying granulomatous disease. Electronically Signed   By: Richardean Sale M.D.   On: 01/28/2016 11:53   I have personally reviewed and evaluated these images and lab results as part of my medical decision-making.   EKG Interpretation None      MDM  I have reviewed relevant laboratory values.I have reviewed relevant imaging studies.I personally interpreted the relevant EKG.I have reviewed the relevant previous healthcare records.I have reviewed EMS Documentation.I obtained HPI from historian. Patient discussed with supervising physician  ED Course:  Assessment: 46y M hx of recent NSTEMI 01-20-16, STEMI Dec 2015, CHF Combined Systolic/Diastolic, CKD IV, Obesity, HTN, and DM II presents today due to worsening cough and BLE since last night. On recent admission for NSTEMI, Cardiology opted against angiography due to CKD and risk outweighing benefits. Also seen by pulmonology due to Hemoptysis and suspected could be due to pulm edema  from CHF exacerbation. DCed on 01-24-16. On exam today, pt in NAD. VS show O2 sats 90-94. Normotensive. Not tachycardic. Afebrile. Lungs CTA. Heart RRR. Abdomen nontender/soft. 3+ pitting edema bilaterally with swelling R>L .Labs show elevated Troponin. No leukocytosis. CHF Exacerbation vs. Pneumonia vs. PE. CXR showed asymmetric right sided airspace disease. Persistent bilateral pleural effusion from previous. EKG showed new inverted  T waves in lateral leads. Consult with Cardiology (Dr. Acie Fredrickson) and informed that Trop, EKG changes expected from previous visit. Does not feel the need to follow, but will be available if needed. Will admit to medicine for evaluation of PE and CHF exacerbation. Started on Heparin in ED. Ordered US dopplers. V/Q scan pending. Given IV lasix for pitting edema.         Disposition/Plan:  Admit to medicine Pt acknowledges and agrees with plan  Supervising Physician Deno Etienne, DO   Final diagnoses:  Leg swelling  Cough       Shary Decamp, PA-C 01/28/16 [place], DO 01/28/16 1649

## 2016-01-28 NOTE — Progress Notes (Signed)
*  PRELIMINARY RESULTS* Vascular Ultrasound Lower extremity venous duplex has been completed.  Preliminary findings: There is a short segment of the distal right peroneal veins that are noncompressible. This is most likely consistent with DVT in this segment. However, there was very poor visualization of calf veins. No other DVT noted bilaterally.   Landry Mellow, RDMS, RVT  01/28/2016, 3:55 PM

## 2016-01-28 NOTE — ED Notes (Signed)
Provider at bedside

## 2016-01-28 NOTE — Telephone Encounter (Addendum)
Pts wife is calling to inform Dr Meda Coffee that the pt had an extremely rough night last night with forceful episodes of coughing.  Pts wife states that the pt would cough so much, that he would become extremely sob, to the point his face would turn blue for a second, until he was able to cough up copious amounts of phlegm.  Pts wife states that he has gained 2 lbs over night, and wanted to know if she could give him extra lasix.  Pts weight today is 212 lbs.  Pts wife states that the pt is to come in to see Cecilie Kicks NP tomorrow for post-hospital follow-up for recent discharge from the hospital with the following diagnosis listed below per NP Ignacia Bayley:  Principal Problem:  Acute MI (HCC)/Out of hospital MI/NSTEMI  Active Problems:  CAD (coronary artery disease)  Hemoptysis **In setting of pulmonary edema.  Acute on chronic combined systolic and diastolic CHF (congestive heart failure) (HCC) **discharge weight = 208 lbs.  Mitral valve insufficiency, ischemic/Moderate MR  Diabetes mellitus type 2 in obese (HCC)  Chronic kidney disease (CKD), stage III (moderate)  Hypertensive heart disease  Paroxysmal atrial fibrillation (HCC)  Hyperlipidemia  Hypoxemia  Per the pts wife she states that the pts cough is productive, with clear phlegm. Pt has no noted blood in his sputum, for he had hemoptysis in the hospital, and Pulmonology was consulted.  Per the wife she states that the pt cannot sleep without multiple pillows propping his head, to facilitate ease of breathing.  Wife states that the does have worsening LEE.  Wife feels that the pt should be evaluated today, instead of waiting tomorrow, because of his "rough night." Informed the wife that Dr Meda Coffee is out of the office today, but I will go and speak with our DOD Dr Mare Ferrari for further recommendations and follow-up with her thereafter.  Wife verbalized understanding and agrees with this plan.

## 2016-01-28 NOTE — Consult Note (Signed)
CARDIOLOGY CONSULT NOTE   Patient ID: Camila Thrasher MRN: [id number] DOB/AGE: 80-Jan-1931 80 y.o.  Admit date: 01/28/2016  Primary Physician   Stephens Shire, MD Primary Cardiologist   Dr Meda Coffee Reason for Consultation  CHF  [id number] Fugitt is a 82 y.o. year old male with a history of DM, HLD, PAF, S/D CHF.   DC 02/19 after late-presentation NSTEMI and heart failure. He was diuresed. He had some hemoptysis. Because of his chronic kidney disease and the hemoptysis, conservative therapy was recommended. He was discharged on aspirin, statin, beta blocker, ACE inhibitor, long-acting nitrates, and Ranexa. Pulmonology had seen because of the hemoptysis and this was felt to be secondary to fractured capillary syndrome from pulmonary edema causing the hemoptysis. His EF was 35-40 percent.  Since discharge from the hospital, Mr. Lou had done fairly well. His weight had been stable on his home scales at 210 pounds. However, he was noting increasing shortness of breath and lower extremity edema. Last p.m., his symptoms worsened. His wife weighed him but fully dressed and after 3 males, his weight had only gone up 2 pounds. She was calling the office today to see what she should do an came to the hospital as requested.  She notes that he has increased lower extremity edema over the last few days. Mr. Sphar also has increasing dyspnea on exertion, orthopnea, but denies PND. He has not had chest pain. In the emergency room, there giving him Lasix 40 mg IV.    Past Medical History  Diagnosis Date  . HLD (hyperlipidemia)   . Type II diabetes mellitus ([place])   . Paroxysmal atrial fibrillation (HCC)     a. diagnosed on 11/2014 admission. Spontaneously converted to NSR. Placed on Eliquis;  b. 12/2015 Eliquis d/c'd 2/2 anemia and nosebleeds.  . Chronic combined systolic and diastolic CHF (congestive heart failure) ([place])     a. 11/2014 Echo: EF 40-45%, akinesis of the inferior, inferolateral and  inferoseptal walls; overall mild to moderate reduction in LV function; grade 2 diastolic dysfunction; severe LAE; trace AI; moderate MR; mild TR with severely elevated pulmonary pressures. b. EF 25% in 04/2015, possibly due to HTN crisis;  c. Echo 8/16:  Mild LVH, EF 50-55%; d. 01/2016 Echo: EF 35-40%, Gr2 DD, mod MR, sev dil LA  . CAD (coronary artery disease)     a. s/p LHC on 11/24/14 w/ mild-mod dz in LAD. CTO of mLCx and OM2 with R-->L and L-->L collaterals. b. Elevated trop 04/2015 felt due to demand ischemia; c. OOH NSTEMI->conservatively managed.  . Renal insufficiency     a. Elev Cr 04/2015 requiring reduction in Eliquis.  . Obesity   . Hypertensive heart disease   . Moderate mitral regurgitation     a. 11/2014 Echo: Mod MR; b. 07/2015 Echo: Mild MR;  c. 01/2016 Echo: Mod MR - presumed to be ischemic in setting of OOH MI.  . Ischemic cardiomyopathy     a. 01/2016 Echo: EF 35-40%.  . Hemoptysis     a. 01/2016 in setting of OOH MI/pulmonary edema-->resolved prior to d/c.     Past Surgical History  Procedure Laterality Date  . Appendectomy    . Total knee arthroplasty Left 2013  . Tonsillectomy    . Joint replacement    . Cardiac catheterization  11/24/2014  . Left heart catheterization with coronary angiogram N/A 11/24/2014    Procedure: LEFT HEART CATHETERIZATION WITH CORONARY ANGIOGRAM;  Surgeon: Jettie Booze, MD;  Location: [facility]  CATH LAB;  Service: Cardiovascular;  Laterality: N/A;  . Esophagogastroduodenoscopy (egd) with propofol N/A 07/16/2015    Procedure: ESOPHAGOGASTRODUODENOSCOPY (EGD) WITH PROPOFOL;  Surgeon: Carol Ada, MD;  Location: [facility] ENDOSCOPY;  Service: Endoscopy;  Laterality: N/A;  . Colonoscopy with propofol N/A 07/16/2015    Procedure: COLONOSCOPY WITH PROPOFOL;  Surgeon: Carol Ada, MD;  Location: [facility] ENDOSCOPY;  Service: Endoscopy;  Laterality: N/A;    Allergies  Allergen Reactions  . Lisinopril     cough    I have reviewed the patient's current  medications . furosemide  40 mg Intravenous Once  . [START ON 01/29/2016] furosemide  40 mg Oral Daily   . azithromycin    . cefTRIAXone (ROCEPHIN)  IV     technetium TC 67M diethylenetriame-pentaacetic acid  Prior to Admission medications   Medication Sig Start Date End Date Taking? Authorizing Provider  aspirin EC 81 MG tablet Take 1 tablet (81 mg total) by mouth daily. 12/10/15  Yes Dorothy Spark, MD  atorvastatin (LIPITOR) 40 MG tablet Take 1 tablet (40 mg total) by mouth daily at 6 PM. 04/29/15  Yes Scott T Kathlen Mody, PA-C  carvedilol (COREG) 6.25 MG tablet Take 1 tablet (6.25 mg total) by mouth 2 (two) times daily with a meal. 01/24/16  Yes Rogelia Mire, NP  Eluxadoline (VIBERZI) 75 MG TABS Take 75 mg by mouth 2 (two) times daily as needed (diarhea).    Yes Historical Provider, MD  ferrous sulfate 325 (65 FE) MG EC tablet Take twice a day with orange juice. Patient taking differently: Take 325 mg by mouth every evening. Take twice a day with orange juice. 08/18/15  Yes Wyatt Portela, MD  furosemide (LASIX) 40 MG tablet Take 1 tablet (40 mg total) by mouth daily. 01/24/16  Yes Rogelia Mire, NP  HYDROcodone-homatropine [facility]) 5-1.5 MG/5ML syrup Take 5 mLs by mouth every 6 (six) hours as needed for cough.   Yes Historical Provider, MD  isosorbide mononitrate (IMDUR) 30 MG 24 hr tablet Take 0.5 tablets (15 mg total) by mouth daily. 01/24/16  Yes Rogelia Mire, NP  multivitamin-iron-minerals-folic acid (CENTRUM) chewable tablet Chew 1 tablet by mouth daily.   Yes Historical Provider, MD  nitroGLYCERIN (NITROSTAT) 0.4 MG SL tablet Place 1 tablet (0.4 mg total) under the tongue every 5 (five) minutes x 3 doses as needed for chest pain. 11/26/14  Yes Eileen Stanford, PA-C  potassium chloride 20 MEQ TBCR Take 20 mEq by mouth daily. 01/24/16  Yes Rogelia Mire, NP  ramipril (ALTACE) 5 MG capsule Take 1 capsule (5 mg total) by mouth daily. 01/24/16  Yes Rogelia Mire,  NP  ranolazine (RANEXA) 1000 MG SR tablet Take 1 tablet (1,000 mg total) by mouth 2 (two) times daily. 12/10/15  Yes Dorothy Spark, MD  tamsulosin (FLOMAX) 0.4 MG CAPS capsule Take 0.4 mg by mouth daily as needed (BPH).   Yes Historical Provider, MD  vitamin B-12 (CYANOCOBALAMIN) 1000 MCG tablet Take 1,000 mcg by mouth daily.   Yes Historical Provider, MD     Social History   Social History  . Marital Status: Married    Spouse Name: N/A  . Number of Children: N/A  . Years of Education: N/A   Occupational History  . Not on file.   Social History Main Topics  . Smoking status: Never Smoker   . Smokeless tobacco: Never Used  . Alcohol Use: 8.4 oz/week    7 Shots of liquor, 7 Standard drinks  or equivalent per week     Comment: Daily  . Drug Use: No  . Sexual Activity: Not Currently   Other Topics Concern  . Not on file   Social History Narrative    Family Status  Relation Status Death Age  . Father Deceased   . Mother Deceased   . Maternal Grandmother Deceased   . Maternal Grandfather Deceased   . Paternal Grandmother Deceased   . Paternal Grandfather Deceased   . Brother Deceased   . Sister Alive   . Sister Alive    Family History  Problem Relation Age of Onset  . Cancer Father 52    Deceased  . Stroke Mother     Deceased  . Heart attack Brother     Deceased. Had cath with stents followed by PE  . Hypertension Neg Hx   . Heart disease Brother   . Heart disease Sister   . Heart disease Sister      ROS:  Full 14 point review of systems complete and found to be negative unless listed above.  Physical Exam: Blood pressure 134/73, pulse 86, temperature 97.7 F (36.5 C), temperature source Oral, resp. rate 20, height 5\' 8"  (1.727 m), weight 208 lb 15.9 oz (94.8 kg), SpO2 96 %.  General: Well developed, well nourished, male in no acute distress Head: Eyes PERRLA, No xanthomas.   Normocephalic and atraumatic, oropharynx without edema or exudate. Dentition:  Poor Lungs: Bibasilar rales Heart: HRRR S1 S2, no rub/gallop, no significant murmur. pulses are 2+ all 4 extrem.   Neck: No carotid bruits. No lymphadenopathy.  JVD 9 cm with positive hepatojugular reflux. Abdomen: Bowel sounds present, abdomen soft and non-tender without masses or hernias noted. Msk:  No spine or cva tenderness. No weakness, no joint deformities or effusions. Extremities: No clubbing or cyanosis. 1-2 plus edema.  Neuro: Alert and oriented X 3. No focal deficits noted. Psych:  Good affect, responds appropriately Skin: No rashes or lesions noted.  Labs:   Lab Results  Component Value Date   WBC 9.2 01/28/2016   HGB 10.2* 01/28/2016   HCT 32.7* 01/28/2016   MCV 93.2 01/28/2016   PLT 248 01/28/2016    Recent Labs Lab 01/28/16 1131  NA 140  K 4.3  CL 106  CO2 24  BUN 26*  CREATININE 2.10*  CALCIUM 9.1  GLUCOSE 154*   MAGNESIUM  Date Value Ref Range Status  01/22/2016 1.9 1.7 - 2.4 mg/dL Final   TROPONIN I  Date Value Ref Range Status  01/22/2016 19.89* <0.031 ng/mL Final  01/22/2016 29.98* <0.031 ng/mL Final  01/21/2016 26.28* <0.031 ng/mL Final   Recent Labs  01/28/16 1130  TROPIPOC 0.69*   B NATRIURETIC PEPTIDE  Date/Time Value Ref Range Status  01/28/2016 12:51 PM 915.7* 0.0 - 100.0 pg/mL Final  01/20/2016 04:05 PM 1905.6* 0.0 - 100.0 pg/mL Final   Lab Results  Component Value Date   CHOL 87 01/21/2016   HDL 50 01/21/2016   LDLCALC 27 01/21/2016   TRIG 50 01/21/2016    Echo: 01/20/2016 - Left ventricle: The cavity size was normal. There was mild concentric hypertrophy. Systolic function was moderately reduced. The estimated ejection fraction was in the range of 35% to 40%. Diffuse hypokinesis. Features are consistent with a pseudonormal left ventricular filling pattern, with concomitant abnormal relaxation and increased filling pressure (grade 2 diastolic dysfunction). Doppler parameters are consistent with  elevated ventricular end-diastolic filling pressure. - Mitral valve: There was moderate regurgitation directedcentrally. -  Left atrium: The atrium was severely dilated. - Right ventricle: The cavity size was normal. Wall thickness was normal. Systolic function was normal. - Right atrium: The atrium was mildly dilated. - Tricuspid valve: There was mild regurgitation. - Pulmonary arteries: Systolic pressure was mildly increased. PA peak pressure: 39 mm Hg (S). - Inferior vena cava: The vessel was normal in size. - Pericardium, extracardiac: There was no pericardial effusion. Impressions: - There is global hypokinesis and akinesis of the basal and mid inferior wall. There is at least moderate mitral regurgitation, previously mild. Mild pulmonary hypertension.  ECG:  01/28/2016 SR, Rate 81 Lateral T wave inversions, different from 02/16 No ST elevation  Radiology:  Dg Chest 2 View 01/28/2016  CLINICAL DATA:  Cough with weakness and shortness of breath for 2 days. EXAM: CHEST  2 VIEW COMPARISON:  CT and radiographs 01/20/2016. FINDINGS: The heart size and mediastinal contours are stable. There are calcified right hilar lymph nodes. The asymmetric patchy airspace opacities within the right lung on the prior examination have improved. Residual interstitial prominence appears chronic. There are persistent bilateral pleural effusions. Right lung granuloma noted. The bones are unchanged. IMPRESSION: Interval resolution of asymmetric right-sided airspace disease demonstrated on radiographs done 8 days ago. Persistent bilateral pleural effusions with evidence of underlying granulomatous disease. Electronically Signed   By: Richardean Sale M.D.   On: 01/28/2016 11:53   Nm Pulmonary Perf And Vent 01/28/2016  CLINICAL DATA:  Shortness of breath and cough.  Initial encounter. EXAM: NUCLEAR MEDICINE VENTILATION - PERFUSION LUNG SCAN TECHNIQUE: Ventilation images were obtained in multiple  projections using inhaled aerosol Tc-54m DTPA. Perfusion images were obtained in multiple projections after intravenous injection of Tc-51m MAA. RADIOPHARMACEUTICALS:  0000000 millicuries [id number] DTPA aerosol inhalation and CT chest 01/20/2016 and PA and lateral chest 01/28/2016. 4.1 millicuries [id number] MAA IV COMPARISON:  None. FINDINGS: Ventilation: No focal ventilation defect. Lateral images could not be obtained due to the patient's debilitated condition. Perfusion: No wedge shaped peripheral perfusion defects to suggest acute pulmonary embolism. Lateral images could not be obtained due the patient's debilitated condition. IMPRESSION: Negative for pulmonary embolus. Electronically Signed   By: Inge Rise M.D.   On: 01/28/2016 15:43    ASSESSMENT AND PLAN:   The patient was seen today by Dr Meda Coffee, the patient evaluated and the data reviewed.  Active Problems: 1.  History of non-ST elevation myocardial infarction (NSTEMI) - Troponin elevation may be secondary to this, continue to cycle but it may continue to trend down. - Continue cardiac medications including aspirin, statin, beta blocker, nitrates and Ranexa  2.  Acute on Chronic combined systolic and diastolic heart failure (HCC) - Agree with diuresis, if Lasix 40 mg is not enough, would use 80 mg IV twice a day. - Track weights, intake/output and daily BMET  3. Acute on chronic kidney failure - At discharge, on 02/19, his BUN was 26 with a creatinine 1.54. - On admission today, his BUN is 26 with creatinine of 2.1. - 1 month ago, BUN was 38 with creatinine of 2.88 - Per IM, continue to follow closely  Otherwise, per IM   Essential hypertension   Diabetes mellitus type 2 in obese ([place])   Hyperlipidemia   Benign prostatic hyperplasia with urinary obstruction   Shortness of breath   Cough  Signed: Lenoard Aden 01/28/2016 3:48 PM Beeper [id number]  Co-Sign MD  The patient was seen, examined and discussed  with Rosaria Ferries, PA-C and I agree with the above.  82 year old gentleman was a patient of mine, with known coronary artery disease an unfortunate story, he was just discharged for non-ST elevation MI with troponin elevation over 65 however because of late presentation and CK D stage III was decided to first proceed with medical management. The patient was also treated for acute and chronic combined systolic and diastolic heart failure and hemoptysis that was believed to be secondary to pulmonary edema. Patient presented 3 days postdischarge with worsening dyspnea on exertion, productive cough, fever, suspicion for pneumonia and acute on chronic combined strength history heart failure. Patient also has acute on chronic kidney failure now creatinine 2.1, with baseline 1.4-1.5. He has been started on azithromycin for presumed pneumonia and IV Lasix, however we advised to increase to 80 mg IV twice a day. We will monitor creatinine closely.  Dorothy Spark, MD 01/28/2016

## 2016-01-28 NOTE — ED Notes (Signed)
Meal tray delivered.

## 2016-01-28 NOTE — ED Notes (Signed)
Pt made aware of bed assignment 

## 2016-01-28 NOTE — ED Notes (Signed)
Spoke with Pharmacist who states spoke with admit Doctor. Stop Heparin infusion at this time.

## 2016-01-28 NOTE — ED Notes (Signed)
EKG completed in triage.

## 2016-01-29 ENCOUNTER — Encounter: Payer: PPO | Admitting: Cardiology

## 2016-01-29 DIAGNOSIS — N179 Acute kidney failure, unspecified: Secondary | ICD-10-CM | POA: Diagnosis not present

## 2016-01-29 DIAGNOSIS — N401 Enlarged prostate with lower urinary tract symptoms: Secondary | ICD-10-CM

## 2016-01-29 DIAGNOSIS — R05 Cough: Secondary | ICD-10-CM | POA: Diagnosis not present

## 2016-01-29 DIAGNOSIS — R0602 Shortness of breath: Secondary | ICD-10-CM

## 2016-01-29 DIAGNOSIS — E669 Obesity, unspecified: Secondary | ICD-10-CM

## 2016-01-29 DIAGNOSIS — E119 Type 2 diabetes mellitus without complications: Secondary | ICD-10-CM

## 2016-01-29 DIAGNOSIS — M7989 Other specified soft tissue disorders: Secondary | ICD-10-CM | POA: Insufficient documentation

## 2016-01-29 DIAGNOSIS — I5042 Chronic combined systolic (congestive) and diastolic (congestive) heart failure: Secondary | ICD-10-CM | POA: Diagnosis not present

## 2016-01-29 LAB — BASIC METABOLIC PANEL
ANION GAP: 9 (ref 5–15)
BUN: 24 mg/dL — ABNORMAL HIGH (ref 6–20)
CALCIUM: 8.9 mg/dL (ref 8.9–10.3)
CO2: 27 mmol/L (ref 22–32)
CREATININE: 1.86 mg/dL — AB (ref 0.61–1.24)
Chloride: 103 mmol/L (ref 101–111)
GFR calc non Af Amer: 32 mL/min — ABNORMAL LOW (ref >60)
GFR, EST AFRICAN AMERICAN: 37 mL/min — AB (ref >60)
GLUCOSE: 124 mg/dL — AB (ref 65–99)
Potassium: 4 mmol/L (ref 3.5–5.1)
Sodium: 139 mmol/L (ref 135–145)

## 2016-01-29 LAB — GLUCOSE, CAPILLARY
GLUCOSE-CAPILLARY: 157 mg/dL — AB (ref 65–99)
Glucose-Capillary: 117 mg/dL — ABNORMAL HIGH (ref 65–99)
Glucose-Capillary: 122 mg/dL — ABNORMAL HIGH (ref 65–99)

## 2016-01-29 MED ORDER — FUROSEMIDE 10 MG/ML IJ SOLN
40.0000 mg | Freq: Two times a day (BID) | INTRAMUSCULAR | Status: DC
  Administered 2016-01-29 – 2016-01-30 (×3): 40 mg via INTRAVENOUS
  Filled 2016-01-29 (×3): qty 4

## 2016-01-29 NOTE — Progress Notes (Signed)
Utilization review completed. Kendyl Festa, RN, BSN. 

## 2016-01-29 NOTE — Progress Notes (Signed)
[facility]                                                                              Patient Demographics  [name] [name], is a 82 y.o. male, DOB - [birth year], [id number]  Admit date - 01/28/2016   Admitting Physician [name] Patricia, MD  Outpatient Primary MD for the patient is [name] Shire, MD  LOS - 1   Chief Complaint  Patient presents with  . Cough      HPI on 01/28/2016 by Dr. Phillips [name] [name] [name] is a 82 y.o. male, with a past medical history significant for extensive coronary artery disease, recent admission to [name] for STEMI under cardiology service, ,ischemic cardiomyopathy (EF recently 35-40% by echo) , chronic combined systolic and diastolic heart failure, paroxysmal atrial fibrillation, chronic kidney disease stage III-IV, obesity, hypertension, type 2 diabetes mellitus presents secondary to complaints of cough, productive with green phlegm, and worsening lower extremity edema, patient recent hospitalization from 2/15> 2/19, seen by cardiology, no PCI given his renal disease, and hemoptysis, patient presents today with complaints of cough over last 48 hours, became productive yesterday, with green sputum, chest x-ray with no acute findings, as well reports worsening lower extremity edema, patient denies any chest pain, any shortness of breath, any dyspnea, denies any hematemesis, patient reports fever 100.4 yesterday at home ,I was called to admit.  Assessment & Plan   Cough -Possibly acute bronchitis versus viral illness -Chest x-ray showed asymmetric right-sided airspace disease, persistent bilateral pleural effusions with evidence of underlying granulomatous disease -Continue azithromycin and ceftriaxone -Influenza PCR negative -Ramipril held  Coronary artery disease with recent NSTEMI -Cardiology consulted and appreciated -Continue Coreg, aspirin, statin, Imdur, Ranexa -Ramipril held given acute kidney injury and cough  Acute on  chronic combined systolic and diastolic heart failure/Lower extremity edema -Recent echocardiogram showed EF of 35-40% -Cardiology consulted and appreciated- recommended continuing IV Lasix twice a day, switch to PO Lasix on 03/29/2016 -Lower extremity Doppler: Distal right popliteal DVT.  Admitting physician spoke with vascular surgery, no indication for anticoagulation at this time. Repeat venous Doppler within 1 week to ensure no progression of the DVT -VQ scan was negative for PE -Continue Lasix -Continue to monitor daily weights, intake and output  Acute on chronic kidney disease, stage III -ramipril held -Baseline creatinine 1.4-1.5 -Creatinine currently 1.86 (was 2.1 upon admission) -Continue to monitor BMP closely as patient is currently diuresing  BPH -Continue Flomax  Essential Hypertension -Continue home medications -Ramipril held  Diabetes Mellitus, Type 2 -Currently not on any home medications -Continue to monitor CBGs  Code Status: Full  Family Communication: Son at bedside  Disposition Plan: Admitted.  Expected discharge within 24-48 hours  Time Spent in minutes   30 minutes  Procedures  None  Consults   Cardiology  DVT Prophylaxis  Heparin  Lab Results  Component Value Date   PLT 248 01/28/2016    Medications  Scheduled Meds: . aspirin EC  81 mg Oral Daily  . atorvastatin  40 mg Oral q1800  . carvedilol  6.25 mg Oral BID WC  . cefTRIAXone (ROCEPHIN)  IV  1 g Intravenous Q24H  . ferrous sulfate  325 mg Oral QPM  . furosemide  40 mg Intravenous BID  . heparin  5,000 Units Subcutaneous 3 times per day  . isosorbide mononitrate  15 mg Oral Daily  . multivitamin with minerals  1 tablet Oral Daily  . ranolazine  1,000 mg Oral BID  . vitamin B-12  1,000 mcg Oral Daily   Continuous Infusions:  PRN Meds:.acetaminophen **OR** acetaminophen, Eluxadoline, HYDROcodone-homatropine, nitroGLYCERIN, tamsulosin, technetium TC 1M  diethylenetriame-pentaacetic acid  Antibiotics   Anti-infectives    Start     Dose/Rate Route Frequency Ordered Stop   01/28/16 1445  azithromycin (ZITHROMAX) 500 mg in dextrose 5 % 250 mL IVPB     500 mg 250 mL/hr over 60 Minutes Intravenous  Once 01/28/16 1440 01/28/16 1748   01/28/16 1445  cefTRIAXone (ROCEPHIN) 1 g in dextrose 5 % 50 mL IVPB     1 g 100 mL/hr over 30 Minutes Intravenous Every 24 hours 01/28/16 1440        Subjective:   [name] seen and examined today.  Patient states he is feeling better and his breathing has improved. Patient feels his right leg always been swollen as compared to his left leg. Currently denies any chest pain, abdominal pain, nausea or vomiting, diarrhea constipation, dizziness or headache.  Objective:   Filed Vitals:   01/29/16 0419 01/29/16 0425 01/29/16 0949 01/29/16 1113  BP:  119/56 104/51 119/61  Pulse:  75 78 74  Temp:  98.3 F (36.8 C) 98.5 F (36.9 C) 97.6 F (36.4 C)  TempSrc:  Oral Oral Oral  Resp:  18 18 18   Height:      Weight: 93.895 kg (207 lb)     SpO2:  92% 92% 93%    Wt Readings from Last 3 Encounters:  01/29/16 93.895 kg (207 lb)  01/24/16 94.756 kg (208 lb 14.4 oz)  12/17/15 97.07 kg (214 lb)     Intake/Output Summary (Last 24 hours) at 01/29/16 1427 Last data filed at 01/29/16 1400  Gross per 24 hour  Intake    530 ml  Output   1650 ml  Net  -1120 ml    Exam  General: Well developed, well nourished, NAD  HEENT: NCAT, mucous membranes moist.   Cardiovascular: S1 S2 auscultated, no rubs, murmurs or gallops. Regular rate and rhythm.  Respiratory: Clear to auscultation bilaterally   Abdomen: Soft, nontender, nondistended, + bowel sounds  Extremities: warm dry without cyanosis clubbing. RLE edema > L  Neuro: AAOx3,nonfocal  Psych: Normal affect and demeanor with intact judgement and insight  Data Review   Micro Results Recent Results (from the past 240 hour(s))  MRSA PCR Screening      Status: None   Collection Time: 01/20/16  3:55 PM  Result Value Ref Range Status   MRSA by PCR NEGATIVE NEGATIVE Final    Comment:        The GeneXpert MRSA Assay (FDA approved for NASAL specimens only), is one component of a comprehensive MRSA colonization surveillance program. It is not intended to diagnose MRSA infection nor to guide or monitor treatment for MRSA infections.     Radiology Reports Dg Chest 2 View  01/28/2016  CLINICAL DATA:  Cough with weakness and shortness of breath for 2 days. EXAM: CHEST  2 VIEW COMPARISON:  CT and radiographs 01/20/2016. FINDINGS: The heart size and mediastinal contours are stable. There are calcified right hilar lymph nodes. The asymmetric patchy airspace opacities within the right lung on the prior examination have  improved. Residual interstitial prominence appears chronic. There are persistent bilateral pleural effusions. Right lung granuloma noted. The bones are unchanged. IMPRESSION: Interval resolution of asymmetric right-sided airspace disease demonstrated on radiographs done 8 days ago. Persistent bilateral pleural effusions with evidence of underlying granulomatous disease. Electronically Signed   By: Richardean Sale M.D.   On: 01/28/2016 11:53   Dg Chest 2 View  01/20/2016  CLINICAL DATA:  Left-sided chest pain shortness of breath for 2 days EXAM: CHEST  2 VIEW COMPARISON:  04/24/2015 FINDINGS: Cardiac shadow is at the upper limits of normal in size. Some platelike atelectasis is noted in the left lung base which may be related to scarring. Mild vascular congestion is again seen with changes of interstitial edema. No focal confluent infiltrate is seen. IMPRESSION: Mild CHF with interstitial edema. Electronically Signed   By: Inez Catalina M.D.   On: 01/20/2016 12:54   Ct Chest Wo Contrast  01/20/2016  CLINICAL DATA:  Pleural effusion on chest x-ray. EXAM: CT CHEST WITHOUT CONTRAST TECHNIQUE: Multidetector CT imaging of the chest was performed  following the standard protocol without IV contrast. COMPARISON:  Chest x-ray earlier the same day. FINDINGS: Mediastinum / Lymph Nodes: There is no axillary lymphadenopathy. 9 mm right thyroid nodule noted. No mediastinal lymphadenopathy. Calcified lymph nodes are seen in the subcarinal station and right hilum. The esophagus has normal imaging features. Heart size is upper normal. Coronary artery calcification is noted. Lungs / Pleura: There is central ground-glass attenuation involving the right upper, middle, and lower lobes with a central predominance. Scattered areas of interlobular septal thickening associated. There is relative sparing at the right lung base. Mild dependent atelectasis or airspace disease is seen in the left lower lobe. Small bilateral pleural effusions are noted. Upper Abdomen: Bilateral adrenal nodules measure in the 1-2 cm size range. These are incompletely visualized but where seen show average attenuation less than 10 Hounsfield units, most consistent with adenomas. MSK / Soft Tissues: Bone windows reveal no worrisome lytic or sclerotic osseous lesions. IMPRESSION: Diffuse central alveolar opacity in the right lung with a somewhat peribronchovascular nodular distribution and peripheral/ basilar sparing. Imaging features are probably related to diffuse right-sided pneumonia given the asymmetry although asymmetric pulmonary edema cannot be entirely excluded. Pulmonary hemorrhage would also be a consideration. Small bilateral pleural effusions. Bilateral adrenal nodules most consistent with adenomas. Electronically Signed   By: Misty Stanley M.D.   On: 01/20/2016 17:16   Nm Pulmonary Perf And Vent  01/28/2016  CLINICAL DATA:  Shortness of breath and cough.  Initial encounter. EXAM: NUCLEAR MEDICINE VENTILATION - PERFUSION LUNG SCAN TECHNIQUE: Ventilation images were obtained in multiple projections using inhaled aerosol Tc-78m DTPA. Perfusion images were obtained in multiple projections  after intravenous injection of Tc-87m MAA. RADIOPHARMACEUTICALS:  0000000 millicuries [id number] DTPA aerosol inhalation and CT chest 01/20/2016 and PA and lateral chest 01/28/2016. 4.1 millicuries [id number] MAA IV COMPARISON:  None. FINDINGS: Ventilation: No focal ventilation defect. Lateral images could not be obtained due to the patient's debilitated condition. Perfusion: No wedge shaped peripheral perfusion defects to suggest acute pulmonary embolism. Lateral images could not be obtained due the patient's debilitated condition. IMPRESSION: Negative for pulmonary embolus. Electronically Signed   By: Inge Rise M.D.   On: 01/28/2016 15:43    CBC  Recent Labs Lab 01/23/16 0303 01/24/16 0334 01/28/16 1131  WBC 7.2 5.9 9.2  HGB 10.0* 9.7* 10.2*  HCT 31.3* 30.6* 32.7*  PLT 197 204 248  MCV 92.3 93.0 93.2  MCH 29.5 29.5 29.1  MCHC 31.9 31.7 31.2  RDW 14.3 14.4 14.2    Chemistries   Recent Labs Lab 01/24/16 0334 01/28/16 1131 01/29/16 0430  NA 140 140 139  K 3.4* 4.3 4.0  CL 105 106 103  CO2 26 24 27   GLUCOSE 128* 154* 124*  BUN 26* 26* 24*  CREATININE 1.54* 2.10* 1.86*  CALCIUM 8.9 9.1 8.9   ------------------------------------------------------------------------------------------------------------------ estimated creatinine clearance is 33.5 mL/min (by C-G formula based on Cr of 1.86). ------------------------------------------------------------------------------------------------------------------ No results for input(s): HGBA1C in the last 72 hours. ------------------------------------------------------------------------------------------------------------------ No results for input(s): CHOL, HDL, LDLCALC, TRIG, CHOLHDL, LDLDIRECT in the last 72 hours. ------------------------------------------------------------------------------------------------------------------ No results for input(s): TSH, T4TOTAL, T3FREE, THYROIDAB in the last 72 hours.  Invalid  input(s): FREET3 ------------------------------------------------------------------------------------------------------------------ No results for input(s): VITAMINB12, FOLATE, FERRITIN, TIBC, IRON, RETICCTPCT in the last 72 hours.  Coagulation profile No results for input(s): INR, PROTIME in the last 168 hours.  No results for input(s): DDIMER in the last 72 hours.  Cardiac Enzymes No results for input(s): CKMB, TROPONINI, MYOGLOBIN in the last 168 hours.  Invalid input(s): CK ------------------------------------------------------------------------------------------------------------------ Invalid input(s): POCBNP    Moon Budde D.O. on 01/29/2016 at 2:27 PM  Between 7am to 7pm - Pager - [phone]  After 7pm go to [email or web address] - password [email or web address]  And look for the night coverage person covering for me after hours  [facility] Office  [phone]

## 2016-01-29 NOTE — Progress Notes (Signed)
Patient Name: [name] Date of Encounter: 01/29/2016  Active Problems:   History of non-ST elevation myocardial infarction (NSTEMI)   Essential hypertension   Diabetes mellitus type 2 in obese (HCC)   Hyperlipidemia   Chronic combined systolic and diastolic heart failure (HCC)   Benign prostatic hyperplasia with urinary obstruction   Shortness of breath   Cough   Acute on chronic kidney failure (HCC)   SOB (shortness of breath)   Length of Stay: 1  SUBJECTIVE  He feels significantly better, denies CP, improved SOB.  CURRENT MEDS . aspirin EC  81 mg Oral Daily  . atorvastatin  40 mg Oral q1800  . carvedilol  6.25 mg Oral BID WC  . cefTRIAXone (ROCEPHIN)  IV  1 g Intravenous Q24H  . ferrous sulfate  325 mg Oral QPM  . furosemide  40 mg Oral Daily  . furosemide  40 mg Oral Daily  . heparin  5,000 Units Subcutaneous 3 times per day  . isosorbide mononitrate  15 mg Oral Daily  . multivitamin with minerals  1 tablet Oral Daily  . ranolazine  1,000 mg Oral BID  . vitamin B-12  1,000 mcg Oral Daily    OBJECTIVE  Filed Vitals:   01/28/16 1956 01/29/16 0055 01/29/16 0419 01/29/16 0425  BP: 116/60 110/53  119/56  Pulse: 72 75  75  Temp: 98.4 F (36.9 C) 98.2 F (36.8 C)  98.3 F (36.8 C)  TempSrc: Oral Oral  Oral  Resp: 18 18  18   Height: 5\' 8"  (1.727 m)     Weight: 208 lb (94.348 kg)  207 lb (93.895 kg)   SpO2: 94% 92%  92%    Intake/Output Summary (Last 24 hours) at 01/29/16 0905 Last data filed at 01/29/16 [id number]  Gross per 24 hour  Intake     50 ml  Output   1300 ml  Net  -1250 ml   Filed Weights   01/28/16 1300 01/28/16 1956 01/29/16 0419  Weight: 208 lb 15.9 oz (94.8 kg) 208 lb (94.348 kg) 207 lb (93.895 kg)    PHYSICAL EXAM  General: Pleasant, NAD. Neuro: Alert and oriented X 3. Moves all extremities spontaneously. Psych: Normal affect. HEENT:  Normal  Neck: Supple without bruits or JVD. Lungs:  Resp regular and unlabored, CTA. Heart: RRR  no s3, s4, or murmurs. Abdomen: Soft, non-tender, non-distended, BS + x 4.  Extremities: No clubbing, cyanosis or edema. DP/PT/Radials 2+ and equal bilaterally.  Accessory Clinical Findings  CBC  Recent Labs  01/28/16 1131  WBC 9.2  HGB 10.2*  HCT 32.7*  MCV 93.2  PLT [id number]   Basic Metabolic Panel  Recent Labs  01/28/16 1131 01/29/16 0430  NA 140 139  K 4.3 4.0  CL 106 103  CO2 24 27  GLUCOSE 154* 124*  BUN 26* 24*  CREATININE 2.10* 1.86*  CALCIUM 9.1 8.9   Radiology/Studies  Dg Chest 2 View  01/28/2016  CLINICAL DATA:  Cough with weakness and shortness of breath for 2 days. EXAM: CHEST  2 VIEW COMPARISON:  CT and radiographs 01/20/2016. FINDINGS: The heart size and mediastinal contours are stable. There are calcified right hilar lymph nodes. The asymmetric patchy airspace opacities within the right lung on the prior examination have improved. Residual interstitial prominence appears chronic. There are persistent bilateral pleural effusions. Right lung granuloma noted. The bones are unchanged. IMPRESSION: Interval resolution of asymmetric right-sided airspace disease demonstrated on radiographs done 8 days ago. Persistent bilateral pleural  effusions with evidence of underlying granulomatous disease. Electronically Signed   By: Richardean Sale M.D.   On: 01/28/2016 11:53   Nm Pulmonary Perf And Vent  01/28/2016  CLINICAL DATA:  Shortness of breath and cough.  Initial encounter. EXAM: NUCLEAR MEDICINE VENTILATION - PERFUSION LUNG SCAN TECHNIQUE: Ventilation images were obtained in multiple projections using inhaled aerosol Tc-12m DTPA. Perfusion images were obtained in multiple projections after intravenous injection of Tc-11m MAA. RADIOPHARMACEUTICALS:  0000000 millicuries [id number] DTPA aerosol inhalation and CT chest 01/20/2016 and PA and lateral chest 01/28/2016. 4.1 millicuries [id number] MAA IV COMPARISON:  None. FINDINGS: Ventilation: No focal ventilation defect.  Lateral images could not be obtained due to the patient's debilitated condition. Perfusion: No wedge shaped peripheral perfusion defects to suggest acute pulmonary embolism. Lateral images could not be obtained due the patient's debilitated condition. IMPRESSION: Negative for pulmonary embolus. Electronically Signed   By: Inge Rise M.D.   On: 01/28/2016 15:43   TELE: SR    ASSESSMENT AND PLAN  Active Problems: 1. History of non-ST elevation myocardial infarction (NSTEMI) 2. Acute on Chronic combined systolic and diastolic heart failure ([place]) 3. Acute on chronic kidney failure  82 year old gentleman was a patient of mine, with known coronary artery disease an unfortunate story, he was just discharged for non-ST elevation MI with troponin elevation over 65 however because of late presentation and CK D stage III was decided to first proceed with medical management. The patient was also treated for acute and chronic combined systolic and diastolic heart failure and hemoptysis that was believed to be secondary to pulmonary edema. Patient presented 3 days postdischarge with worsening dyspnea on exertion, productive cough, fever, suspicion for pneumonia and acute on chronic combined strength history heart failure. Patient also has acute on chronic kidney failure now creatinine 2.1, with baseline 1.4-1.5.  He diuresed well overnight with - 1.2 L and significant symptoms improvement. I would continue iv diuresis 40 mg iv BID and switch to PO 40 mg BID tomorrow at the anticipated discharge.  Signed, Dorothy Spark MD, [facility] 01/29/2016

## 2016-01-30 DIAGNOSIS — R05 Cough: Secondary | ICD-10-CM | POA: Diagnosis not present

## 2016-01-30 DIAGNOSIS — I10 Essential (primary) hypertension: Secondary | ICD-10-CM | POA: Diagnosis not present

## 2016-01-30 DIAGNOSIS — I5042 Chronic combined systolic (congestive) and diastolic (congestive) heart failure: Secondary | ICD-10-CM | POA: Diagnosis not present

## 2016-01-30 DIAGNOSIS — R0602 Shortness of breath: Secondary | ICD-10-CM | POA: Diagnosis not present

## 2016-01-30 DIAGNOSIS — N179 Acute kidney failure, unspecified: Secondary | ICD-10-CM | POA: Diagnosis not present

## 2016-01-30 DIAGNOSIS — E785 Hyperlipidemia, unspecified: Secondary | ICD-10-CM | POA: Diagnosis not present

## 2016-01-30 DIAGNOSIS — N401 Enlarged prostate with lower urinary tract symptoms: Secondary | ICD-10-CM | POA: Diagnosis not present

## 2016-01-30 DIAGNOSIS — I252 Old myocardial infarction: Secondary | ICD-10-CM | POA: Diagnosis not present

## 2016-01-30 LAB — CBC
HEMATOCRIT: 33.3 % — AB (ref 39.0–52.0)
Hemoglobin: 10.7 g/dL — ABNORMAL LOW (ref 13.0–17.0)
MCH: 29.8 pg (ref 26.0–34.0)
MCHC: 32.1 g/dL (ref 30.0–36.0)
MCV: 92.8 fL (ref 78.0–100.0)
PLATELETS: 245 10*3/uL (ref 150–400)
RBC: 3.59 MIL/uL — AB (ref 4.22–5.81)
RDW: 14.1 % (ref 11.5–15.5)
WBC: 6 10*3/uL (ref 4.0–10.5)

## 2016-01-30 LAB — GLUCOSE, CAPILLARY
GLUCOSE-CAPILLARY: 142 mg/dL — AB (ref 65–99)
GLUCOSE-CAPILLARY: 121 mg/dL — AB (ref 65–99)
Glucose-Capillary: 118 mg/dL — ABNORMAL HIGH (ref 65–99)

## 2016-01-30 LAB — BASIC METABOLIC PANEL
ANION GAP: 10 (ref 5–15)
BUN: 25 mg/dL — ABNORMAL HIGH (ref 6–20)
CALCIUM: 9.1 mg/dL (ref 8.9–10.3)
CO2: 25 mmol/L (ref 22–32)
Chloride: 103 mmol/L (ref 101–111)
Creatinine, Ser: 2.01 mg/dL — ABNORMAL HIGH (ref 0.61–1.24)
GFR, EST AFRICAN AMERICAN: 34 mL/min — AB (ref >60)
GFR, EST NON AFRICAN AMERICAN: 29 mL/min — AB (ref >60)
Glucose, Bld: 141 mg/dL — ABNORMAL HIGH (ref 65–99)
POTASSIUM: 4 mmol/L (ref 3.5–5.1)
Sodium: 138 mmol/L (ref 135–145)

## 2016-01-30 MED ORDER — FUROSEMIDE 80 MG PO TABS
80.0000 mg | ORAL_TABLET | Freq: Two times a day (BID) | ORAL | Status: DC
  Administered 2016-01-30 – 2016-01-31 (×2): 80 mg via ORAL
  Filled 2016-01-30 (×2): qty 1

## 2016-01-30 NOTE — Progress Notes (Signed)
[facility]                                                                              Patient Demographics  [name] [name], is a 82 y.o. male, DOB - 01-01-1930, [id number]  Admit date - 01/28/2016   Admitting Physician [name] Patricia, MD  Outpatient Primary MD for the patient is [name] Shire, MD  LOS - 2   Chief Complaint  Patient presents with  . Cough      HPI on 01/28/2016 by Dr. Phillips Climes Raun [name] is a 82 y.o. male, with a past medical history significant for extensive coronary artery disease, recent admission to [name] for STEMI under cardiology service, ,ischemic cardiomyopathy (EF recently 35-40% by echo) , chronic combined systolic and diastolic heart failure, paroxysmal atrial fibrillation, chronic kidney disease stage III-IV, obesity, hypertension, type 2 diabetes mellitus presents secondary to complaints of cough, productive with green phlegm, and worsening lower extremity edema, patient recent hospitalization from 2/15> 2/19, seen by cardiology, no PCI given his renal disease, and hemoptysis, patient presents today with complaints of cough over last 48 hours, became productive yesterday, with green sputum, chest x-ray with no acute findings, as well reports worsening lower extremity edema, patient denies any chest pain, any shortness of breath, any dyspnea, denies any hematemesis, patient reports fever 100.4 yesterday at home ,I was called to admit.  Assessment & Plan   Cough -Possibly acute bronchitis versus viral illness -Improving  -Chest x-ray showed asymmetric right-sided airspace disease, persistent bilateral pleural effusions with evidence of underlying granulomatous disease -Continue azithromycin and ceftriaxone -Influenza PCR negative -Ramipril held  Coronary artery disease with recent NSTEMI -Cardiology consulted and appreciated -Continue Coreg, aspirin, statin, Imdur, Ranexa -Ramipril held given acute kidney injury and  cough  Acute on chronic combined systolic and diastolic heart failure/Lower extremity edema -Recent echocardiogram showed EF of 35-40% -Cardiology consulted and appreciated- recommended continuing IV Lasix twice a day, switch to PO Lasix on 03/29/2016 -Lower extremity Doppler: Distal right popliteal DVT.  Admitting physician spoke with vascular surgery, no indication for anticoagulation at this time. Repeat venous Doppler within 1 week to ensure no progression of the DVT -VQ scan was negative for PE -Continue Lasix, transitioned to lasix 80mg  PO BID  -Continue to monitor daily weights, intake and output (output not being documented due to patient compliance)  Acute on chronic kidney disease, stage III -ramipril held -Baseline creatinine 1.4-1.5 -Creatinine currently 2.01 (was 2.1 upon admission) -Continue to monitor BMP closely as patient is currently diuresing  BPH -Continue Flomax  Essential Hypertension -Continue home medications -Ramipril held  Diabetes Mellitus, Type 2 -Currently not on any home medications -Continue to monitor CBGs  Code Status: Full  Family Communication: None at bedside  Disposition Plan: Admitted.  Expected discharge within 24-48 hours  Time Spent in minutes   30 minutes  Procedures  None  Consults   Cardiology  DVT Prophylaxis  Heparin  Lab Results  Component Value Date   PLT 245 01/30/2016    Medications  Scheduled Meds: . aspirin EC  81 mg Oral Daily  . atorvastatin  40 mg Oral q1800  . carvedilol  6.25 mg Oral BID  WC  . cefTRIAXone (ROCEPHIN)  IV  1 g Intravenous Q24H  . ferrous sulfate  325 mg Oral QPM  . furosemide  80 mg Oral BID  . heparin  5,000 Units Subcutaneous 3 times per day  . isosorbide mononitrate  15 mg Oral Daily  . multivitamin with minerals  1 tablet Oral Daily  . ranolazine  1,000 mg Oral BID  . vitamin B-12  1,000 mcg Oral Daily   Continuous Infusions:  PRN Meds:.acetaminophen **OR** acetaminophen,  Eluxadoline, HYDROcodone-homatropine, nitroGLYCERIN, tamsulosin, technetium TC 28M diethylenetriame-pentaacetic acid  Antibiotics   Anti-infectives    Start     Dose/Rate Route Frequency Ordered Stop   01/28/16 1445  azithromycin (ZITHROMAX) 500 mg in dextrose 5 % 250 mL IVPB     500 mg 250 mL/hr over 60 Minutes Intravenous  Once 01/28/16 1440 01/28/16 1748   01/28/16 1445  cefTRIAXone (ROCEPHIN) 1 g in dextrose 5 % 50 mL IVPB     1 g 100 mL/hr over 30 Minutes Intravenous Every 24 hours 01/28/16 1440        Subjective:   [name] seen and examined today.  Patient feels his breathing has improved and he is close to his baseline.  Patient would like to go home.  Denies chest pain, abdominal pain, nausea or vomiting, diarrhea constipation, dizziness or headache.  Objective:   Filed Vitals:   01/29/16 1641 01/29/16 2001 01/30/16 0432 01/30/16 1249  BP: 120/66 112/54 118/60 100/57  Pulse: 76 76 77 76  Temp: 98.1 F (36.7 C) 97.3 F (36.3 C) 97.5 F (36.4 C) 97.5 F (36.4 C)  TempSrc: Oral Oral Oral Oral  Resp: 18 18 18 20   Height:      Weight:   92.806 kg (204 lb 9.6 oz)   SpO2: 92% 93% 94% 94%    Wt Readings from Last 3 Encounters:  01/30/16 92.806 kg (204 lb 9.6 oz)  01/24/16 94.756 kg (208 lb 14.4 oz)  12/17/15 97.07 kg (214 lb)     Intake/Output Summary (Last 24 hours) at 01/30/16 1306 Last data filed at 01/30/16 0940  Gross per 24 hour  Intake    820 ml  Output    900 ml  Net    -80 ml    Exam  General: Well developed, well nourished, NAD  HEENT: NCAT, mucous membranes moist.   Cardiovascular: S1 S2 auscultated, RRR, no murmurs  Respiratory: Clear to auscultation bilaterally   Abdomen: Soft, nontender, nondistended, + bowel sounds  Extremities: warm dry without cyanosis clubbing. + 1RLE edema > L  Neuro: AAOx3,nonfocal  Psych: Normal affect and demeanor, pleasant  Data Review   Micro Results Recent Results (from the past 240 hour(s))   MRSA PCR Screening     Status: None   Collection Time: 01/20/16  3:55 PM  Result Value Ref Range Status   MRSA by PCR NEGATIVE NEGATIVE Final    Comment:        The GeneXpert MRSA Assay (FDA approved for NASAL specimens only), is one component of a comprehensive MRSA colonization surveillance program. It is not intended to diagnose MRSA infection nor to guide or monitor treatment for MRSA infections.     Radiology Reports Dg Chest 2 View  01/28/2016  CLINICAL DATA:  Cough with weakness and shortness of breath for 2 days. EXAM: CHEST  2 VIEW COMPARISON:  CT and radiographs 01/20/2016. FINDINGS: The heart size and mediastinal contours are stable. There are calcified right hilar lymph nodes. The asymmetric  patchy airspace opacities within the right lung on the prior examination have improved. Residual interstitial prominence appears chronic. There are persistent bilateral pleural effusions. Right lung granuloma noted. The bones are unchanged. IMPRESSION: Interval resolution of asymmetric right-sided airspace disease demonstrated on radiographs done 8 days ago. Persistent bilateral pleural effusions with evidence of underlying granulomatous disease. Electronically Signed   By: Richardean Sale M.D.   On: 01/28/2016 11:53   Dg Chest 2 View  01/20/2016  CLINICAL DATA:  Left-sided chest pain shortness of breath for 2 days EXAM: CHEST  2 VIEW COMPARISON:  04/24/2015 FINDINGS: Cardiac shadow is at the upper limits of normal in size. Some platelike atelectasis is noted in the left lung base which may be related to scarring. Mild vascular congestion is again seen with changes of interstitial edema. No focal confluent infiltrate is seen. IMPRESSION: Mild CHF with interstitial edema. Electronically Signed   By: Inez Catalina M.D.   On: 01/20/2016 12:54   Ct Chest Wo Contrast  01/20/2016  CLINICAL DATA:  Pleural effusion on chest x-ray. EXAM: CT CHEST WITHOUT CONTRAST TECHNIQUE: Multidetector CT imaging  of the chest was performed following the standard protocol without IV contrast. COMPARISON:  Chest x-ray earlier the same day. FINDINGS: Mediastinum / Lymph Nodes: There is no axillary lymphadenopathy. 9 mm right thyroid nodule noted. No mediastinal lymphadenopathy. Calcified lymph nodes are seen in the subcarinal station and right hilum. The esophagus has normal imaging features. Heart size is upper normal. Coronary artery calcification is noted. Lungs / Pleura: There is central ground-glass attenuation involving the right upper, middle, and lower lobes with a central predominance. Scattered areas of interlobular septal thickening associated. There is relative sparing at the right lung base. Mild dependent atelectasis or airspace disease is seen in the left lower lobe. Small bilateral pleural effusions are noted. Upper Abdomen: Bilateral adrenal nodules measure in the 1-2 cm size range. These are incompletely visualized but where seen show average attenuation less than 10 Hounsfield units, most consistent with adenomas. MSK / Soft Tissues: Bone windows reveal no worrisome lytic or sclerotic osseous lesions. IMPRESSION: Diffuse central alveolar opacity in the right lung with a somewhat peribronchovascular nodular distribution and peripheral/ basilar sparing. Imaging features are probably related to diffuse right-sided pneumonia given the asymmetry although asymmetric pulmonary edema cannot be entirely excluded. Pulmonary hemorrhage would also be a consideration. Small bilateral pleural effusions. Bilateral adrenal nodules most consistent with adenomas. Electronically Signed   By: Misty Stanley M.D.   On: 01/20/2016 17:16   Nm Pulmonary Perf And Vent  01/28/2016  CLINICAL DATA:  Shortness of breath and cough.  Initial encounter. EXAM: NUCLEAR MEDICINE VENTILATION - PERFUSION LUNG SCAN TECHNIQUE: Ventilation images were obtained in multiple projections using inhaled aerosol Tc-84m DTPA. Perfusion images were  obtained in multiple projections after intravenous injection of Tc-28m MAA. RADIOPHARMACEUTICALS:  0000000 millicuries [id number] DTPA aerosol inhalation and CT chest 01/20/2016 and PA and lateral chest 01/28/2016. 4.1 millicuries [id number] MAA IV COMPARISON:  None. FINDINGS: Ventilation: No focal ventilation defect. Lateral images could not be obtained due to the patient's debilitated condition. Perfusion: No wedge shaped peripheral perfusion defects to suggest acute pulmonary embolism. Lateral images could not be obtained due the patient's debilitated condition. IMPRESSION: Negative for pulmonary embolus. Electronically Signed   By: Inge Rise M.D.   On: 01/28/2016 15:43    CBC  Recent Labs Lab 01/24/16 0334 01/28/16 1131 01/30/16 0239  WBC 5.9 9.2 6.0  HGB 9.7* 10.2* 10.7*  HCT 30.6* 32.7*  33.3*  PLT 204 248 245  MCV 93.0 93.2 92.8  MCH 29.5 29.1 29.8  MCHC 31.7 31.2 32.1  RDW 14.4 14.2 14.1    Chemistries   Recent Labs Lab 01/24/16 0334 01/28/16 1131 01/29/16 0430 01/30/16 0239  NA 140 140 139 138  K 3.4* 4.3 4.0 4.0  CL 105 106 103 103  CO2 26 24 27 25   GLUCOSE 128* 154* 124* 141*  BUN 26* 26* 24* 25*  CREATININE 1.54* 2.10* 1.86* 2.01*  CALCIUM 8.9 9.1 8.9 9.1   ------------------------------------------------------------------------------------------------------------------ estimated creatinine clearance is 30.8 mL/min (by C-G formula based on Cr of 2.01). ------------------------------------------------------------------------------------------------------------------ No results for input(s): HGBA1C in the last 72 hours. ------------------------------------------------------------------------------------------------------------------ No results for input(s): CHOL, HDL, LDLCALC, TRIG, CHOLHDL, LDLDIRECT in the last 72 hours. ------------------------------------------------------------------------------------------------------------------ No results for  input(s): TSH, T4TOTAL, T3FREE, THYROIDAB in the last 72 hours.  Invalid input(s): FREET3 ------------------------------------------------------------------------------------------------------------------ No results for input(s): VITAMINB12, FOLATE, FERRITIN, TIBC, IRON, RETICCTPCT in the last 72 hours.  Coagulation profile No results for input(s): INR, PROTIME in the last 168 hours.  No results for input(s): DDIMER in the last 72 hours.  Cardiac Enzymes No results for input(s): CKMB, TROPONINI, MYOGLOBIN in the last 168 hours.  Invalid input(s): CK ------------------------------------------------------------------------------------------------------------------ Invalid input(s): POCBNP    Aubreanna Percle D.O. on 01/30/2016 at 1:06 PM  Between 7am to 7pm - Pager - [phone]  After 7pm go to [email or web address] - password [email or web address]  And look for the night coverage person covering for me after hours  [facility] Office  [phone]

## 2016-01-30 NOTE — Progress Notes (Signed)
PATIENT ID: [id number] with CAD s/p NSTEMI, HTN, DM, hyperlipidemia, and CKD here with acute on chronic systolic and diastolic heart failure LVEF 35-40%   SUBJECTIVE:  Feels well.  Breathing improved and lower extremity edema improved.  He has not been keeping his urine for measurement.   PHYSICAL EXAM Filed Vitals:   01/29/16 1113 01/29/16 1641 01/29/16 2001 01/30/16 0432  BP: 119/61 120/66 112/54 118/60  Pulse: 74 76 76 77  Temp: 97.6 F (36.4 C) 98.1 F (36.7 C) 97.3 F (36.3 C) 97.5 F (36.4 C)  TempSrc: Oral Oral Oral Oral  Resp: 18 18 18 18   Height:      Weight:    92.806 kg (204 lb 9.6 oz)  SpO2: 93% 92% 93% 94%   General:  Well-appearing.  No acute distress. Neck: JVP 1 cm above clavicle at 45 degrees Lungs:  CTAB>  No crackles, rhonchi or wheezes Heart:  RRR.  No m/r/g Abdomen:  Soft, NT, ND.  +BS Extremities:  WWP.  1+ pitting edema to ankles bilaterally.   LABS: Lab Results  Component Value Date   TROPONINI 19.89* 01/22/2016   Results for orders placed or performed during the hospital encounter of 01/28/16 (from the past 24 hour(s))  Glucose, capillary     Status: Abnormal   Collection Time: 01/29/16  4:31 PM  Result Value Ref Range   Glucose-Capillary 157 (H) 65 - 99 mg/dL   Comment 1 Notify RN   CBC     Status: Abnormal   Collection Time: 01/30/16  2:39 AM  Result Value Ref Range   WBC 6.0 4.0 - 10.5 K/uL   RBC 3.59 (L) 4.22 - 5.81 MIL/uL   Hemoglobin 10.7 (L) 13.0 - 17.0 g/dL   HCT 33.3 (L) 39.0 - 52.0 %   MCV 92.8 78.0 - 100.0 fL   MCH 29.8 26.0 - 34.0 pg   MCHC 32.1 30.0 - 36.0 g/dL   RDW 14.1 11.5 - 15.5 %   Platelets 245 150 - 400 K/uL  Basic metabolic panel     Status: Abnormal   Collection Time: 01/30/16  2:39 AM  Result Value Ref Range   Sodium 138 135 - 145 mmol/L   Potassium 4.0 3.5 - 5.1 mmol/L   Chloride 103 101 - 111 mmol/L   CO2 25 22 - 32 mmol/L   Glucose, Bld 141 (H) 65 - 99 mg/dL   BUN 25 (H) 6 - 20 mg/dL   Creatinine, Ser 2.01  (H) 0.61 - 1.24 mg/dL   Calcium 9.1 8.9 - 10.3 mg/dL   GFR calc non Af Amer 29 (L) >60 mL/min   GFR calc Af Amer 34 (L) >60 mL/min   Anion gap 10 5 - 15  Glucose, capillary     Status: Abnormal   Collection Time: 01/30/16  6:51 AM  Result Value Ref Range   Glucose-Capillary 118 (H) 65 - 99 mg/dL  Glucose, capillary     Status: Abnormal   Collection Time: 01/30/16 11:39 AM  Result Value Ref Range   Glucose-Capillary 142 (H) 65 - 99 mg/dL   Comment 1 Notify RN     Intake/Output Summary (Last 24 hours) at 01/30/16 1204 Last data filed at 01/30/16 0940  Gross per 24 hour  Intake    820 ml  Output    900 ml  Net    -80 ml   Telemetry: Sinus rhythm  ASSESSMENT AND PLAN:  Active Problems:   History of non-ST elevation myocardial infarction (  NSTEMI)   Essential hypertension   Diabetes mellitus type 2 in obese (HCC)   Hyperlipidemia   Chronic combined systolic and diastolic heart failure (HCC)   Benign prostatic hyperplasia with urinary obstruction   Shortness of breath   Cough   Acute on chronic kidney failure (HCC)   SOB (shortness of breath)   Leg swelling   # Acute on chronic systolic and diastolic heart failure: LVEF 35-40%.  His volume status is improved but JVD is mildly elevated and he has mild LE edema.  Will transition lasix to 80 mg po bid.  If he continues to diurese, would plan for discharge tomorrow.  Weight is down from 207 to 204 lb.  Continue carvedilol.  Resume ramipril when renal function stabilizes.  # CAD: Continue aspirin, atorvastatin, carvedilol, Imdur and ranolazine.  Not an active issue.  # Acute on chronic renal failure:  Kidney function slightly worse today.  Will stop IV lasix and switch to 60 mg po bid.  He remains slightly volume overloaded.  Holding home ramipril as above.   Time spent: 25 minutes-Greater than 50% of this time was spent in counseling, explanation of diagnosis, planning of further management, and coordination of care.    Keil Pickering  C. Oval Linsey, MD, [facility] 01/30/2016 12:04 PM

## 2016-01-31 DIAGNOSIS — I10 Essential (primary) hypertension: Secondary | ICD-10-CM | POA: Diagnosis not present

## 2016-01-31 DIAGNOSIS — I5042 Chronic combined systolic (congestive) and diastolic (congestive) heart failure: Secondary | ICD-10-CM

## 2016-01-31 DIAGNOSIS — M7989 Other specified soft tissue disorders: Secondary | ICD-10-CM

## 2016-01-31 DIAGNOSIS — R05 Cough: Secondary | ICD-10-CM | POA: Diagnosis not present

## 2016-01-31 DIAGNOSIS — I252 Old myocardial infarction: Secondary | ICD-10-CM | POA: Diagnosis not present

## 2016-01-31 DIAGNOSIS — N401 Enlarged prostate with lower urinary tract symptoms: Secondary | ICD-10-CM | POA: Diagnosis not present

## 2016-01-31 DIAGNOSIS — R0602 Shortness of breath: Secondary | ICD-10-CM | POA: Diagnosis not present

## 2016-01-31 DIAGNOSIS — E785 Hyperlipidemia, unspecified: Secondary | ICD-10-CM | POA: Diagnosis not present

## 2016-01-31 DIAGNOSIS — N179 Acute kidney failure, unspecified: Secondary | ICD-10-CM | POA: Diagnosis not present

## 2016-01-31 LAB — BASIC METABOLIC PANEL
ANION GAP: 10 (ref 5–15)
BUN: 26 mg/dL — ABNORMAL HIGH (ref 6–20)
CO2: 25 mmol/L (ref 22–32)
Calcium: 9.3 mg/dL (ref 8.9–10.3)
Chloride: 102 mmol/L (ref 101–111)
Creatinine, Ser: 2.12 mg/dL — ABNORMAL HIGH (ref 0.61–1.24)
GFR calc Af Amer: 31 mL/min — ABNORMAL LOW (ref >60)
GFR, EST NON AFRICAN AMERICAN: 27 mL/min — AB (ref >60)
GLUCOSE: 126 mg/dL — AB (ref 65–99)
POTASSIUM: 4 mmol/L (ref 3.5–5.1)
Sodium: 137 mmol/L (ref 135–145)

## 2016-01-31 LAB — CBC
HEMATOCRIT: 33 % — AB (ref 39.0–52.0)
HEMOGLOBIN: 11.1 g/dL — AB (ref 13.0–17.0)
MCH: 30.5 pg (ref 26.0–34.0)
MCHC: 33.6 g/dL (ref 30.0–36.0)
MCV: 90.7 fL (ref 78.0–100.0)
PLATELETS: 243 10*3/uL (ref 150–400)
RBC: 3.64 MIL/uL — AB (ref 4.22–5.81)
RDW: 13.8 % (ref 11.5–15.5)
WBC: 6.8 10*3/uL (ref 4.0–10.5)

## 2016-01-31 LAB — GLUCOSE, CAPILLARY
Glucose-Capillary: 110 mg/dL — ABNORMAL HIGH (ref 65–99)
Glucose-Capillary: 156 mg/dL — ABNORMAL HIGH (ref 65–99)

## 2016-01-31 MED ORDER — FUROSEMIDE 80 MG PO TABS: 80.0000 mg | ORAL_TABLET | Freq: Two times a day (BID) | ORAL | Status: DC

## 2016-01-31 MED ORDER — FUROSEMIDE 80 MG PO TABS: 80.0000 mg | ORAL_TABLET | Freq: Every day | ORAL | Status: DC

## 2016-01-31 MED ORDER — AZITHROMYCIN 250 MG PO TABS: 250.0000 mg | ORAL_TABLET | Freq: Every day | ORAL | Status: DC

## 2016-01-31 MED ORDER — CEFUROXIME AXETIL 500 MG PO TABS: 500.0000 mg | ORAL_TABLET | Freq: Two times a day (BID) | ORAL | Status: DC

## 2016-01-31 NOTE — Discharge Summary (Addendum)
Physician Discharge Summary  [name] [name] DOB: Jul 28, 1930 DOA: 01/28/2016  PCP: [name] Shire, MD  Admit date: 01/28/2016 Discharge date: 01/31/2016  Time spent: 45 minutes  Recommendations for Outpatient Follow-up:  Patient will be discharged to home.  Patient will need to follow up with primary care provider within one week of discharge, repeat BMP.  Repeat lower extremity ultrasound in one week.  Follow up with Dr. Meda [name], cardiology, in one week.  Patient should continue medications as prescribed.  Patient should follow a heart healthy diet with 1267ml fluid restriction per day.     Discharge Diagnoses:  Cough Coronary artery disease with recent NSTEMI Acute on chronic combined systolic and diastolic heart failure/lower extremity edema Acute on chronic kidney disease, stage 3-4 BPH Hypertension Diabetes mellitus, type II  Discharge Condition: Stable  Diet recommendation: heart healthy diet with 1240ml fluid restriction per day  [name] Weights   01/29/16 0419 01/30/16 0432 01/31/16 0515  Weight: 93.895 kg (207 lb) 92.806 kg (204 lb 9.6 oz) 90.992 kg (200 lb 9.6 oz)    History of present illness:  on 01/28/2016 by Dr. Emeline Gins [name] [name] is a 82 y.o. male, with a past medical history significant for extensive coronary artery disease, recent admission to [name] for STEMI under cardiology service, ,ischemic cardiomyopathy (EF recently 35-40% by echo) , chronic combined systolic and diastolic heart failure, paroxysmal atrial fibrillation, chronic kidney disease stage III-IV, obesity, hypertension, type 2 diabetes mellitus presents secondary to complaints of cough, productive with green phlegm, and worsening lower extremity edema, patient recent hospitalization from 2/15> 2/19, seen by cardiology, no PCI given his renal disease, and hemoptysis, patient presents today with complaints of cough over last 48 hours, became productive yesterday, with green  sputum, chest x-ray with no acute findings, as well reports worsening lower extremity edema, patient denies any chest pain, any shortness of breath, any dyspnea, denies any hematemesis, patient reports fever 100.4 yesterday at home ,I was called to admit.  Hospital Course:  Cough -Possibly acute bronchitis versus viral illness -Improving  -Chest x-ray showed asymmetric right-sided airspace disease, persistent bilateral pleural effusions with evidence of underlying granulomatous disease -initially placed on azithromycin and ceftriaxone- continue azithromycin and ceftin for additional 2 days upon discharge. -Influenza PCR negative -Ramipril held  Coronary artery disease with recent NSTEMI -Cardiology consulted and appreciated -Continue Coreg, aspirin, statin, Imdur, Ranexa -Ramipril held given acute kidney injury and cough  Acute on chronic combined systolic and diastolic heart failure/Lower extremity edema -Recent echocardiogram showed EF of 35-40% -Cardiology consulted and appreciated- recommended continuing IV Lasix twice a day, switch to PO Lasix on 03/29/2016 -Lower extremity Doppler: Distal right popliteal DVT.  Admitting physician spoke with vascular surgery, no indication for anticoagulation at this time. Repeat venous Doppler within 1 week to ensure no progression of the DVT -VQ scan was negative for PE -Continue Lasix- given kidney function, hold lasix dose tonight and restart tomorrow- 80mg  daily  -Spoke with Dr. Oval [name], resume lasix 2/27, follow up with Dr. Meda [name] in 1 week, repeat BMP -Continue to monitor daily weights, intake and output (output not being documented due to patient compliance) -UOP over past 24hrs: 1275cc -Weight down ~4kg since admission  Acute on chronic kidney disease, stage III-IV -ramipril held -Baseline creatinine 1.4-1.5 -Creatinine currently 2.12  -Continue to monitor BMP closely as patient is currently diuresing  BPH -Continue  Flomax  Essential Hypertension -Continue home medications -Ramipril held  Diabetes Mellitus, Type 2 -Currently not on any home medications -  Continue to monitor CBGs  Procedures: None  Consultations: Cardiology   Discharge Exam: Filed Vitals:   01/30/16 1959 01/31/16 0515  BP: 111/63 123/76  Pulse: 75 73  Temp: 97.5 F (36.4 C) 97.6 F (36.4 C)  Resp: 16 18    Exam  General: Well developed, well nourished, NAD  HEENT: NCAT, mucous membranes moist.   Cardiovascular: S1 S2 auscultated, RRR, no murmurs  Respiratory: Clear to auscultation bilaterally  Abdomen: Soft, nontender, nondistended, + bowel sounds  Extremities: warm dry without cyanosis clubbing. +trace edema LE B/L   Neuro: AAOx3,nonfocal  Psych: Flat affect   Discharge Instructions      Discharge Instructions    Discharge instructions    Complete by:  As directed   Patient will be discharged to home.  Patient will need to follow up with primary care provider within one week of discharge, repeat BMP.  Repeat lower extremity ultrasound in one week.  Follow up with Dr. Meda [name], cardiology, in one week.  Patient should continue medications as prescribed.  Patient should follow a heart healthy diet with 1276ml fluid restriction per day.            Medication List    STOP taking these medications        ramipril 5 MG capsule  Commonly known as:  ALTACE      TAKE these medications        aspirin EC 81 MG tablet  Take 1 tablet (81 mg total) by mouth daily.     atorvastatin 40 MG tablet  Commonly known as:  LIPITOR  Take 1 tablet (40 mg total) by mouth daily at 6 PM.     carvedilol 6.25 MG tablet  Commonly known as:  COREG  Take 1 tablet (6.25 mg total) by mouth 2 (two) times daily with a meal.     ferrous sulfate 325 (65 FE) MG EC tablet  Take twice a day with orange juice.     furosemide 80 MG tablet  Commonly known as:  LASIX  Take 1 tablet (80 mg total) by mouth daily.  Start taking  on:  02/01/2016     HYDROcodone-homatropine 5-1.5 MG/5ML syrup  Commonly known as:  HYCODAN  Take 5 mLs by mouth every 6 (six) hours as needed for cough.     isosorbide mononitrate 30 MG 24 hr tablet  Commonly known as:  IMDUR  Take 0.5 tablets (15 mg total) by mouth daily.     multivitamin-iron-minerals-folic acid chewable tablet  Chew 1 tablet by mouth daily.     nitroGLYCERIN 0.4 MG SL tablet  Commonly known as:  NITROSTAT  Place 1 tablet (0.4 mg total) under the tongue every 5 (five) minutes x 3 doses as needed for chest pain.     Potassium Chloride ER 20 MEQ Tbcr  Take 20 mEq by mouth daily.     ranolazine 1000 MG SR tablet  Commonly known as:  RANEXA  Take 1 tablet (1,000 mg total) by mouth 2 (two) times daily.     tamsulosin 0.4 MG Caps capsule  Commonly known as:  FLOMAX  Take 0.4 mg by mouth daily as needed (BPH).     VIBERZI 75 MG Tabs  Generic drug:  Eluxadoline  Take 75 mg by mouth 2 (two) times daily as needed (diarhea).     vitamin B-12 1000 MCG tablet  Commonly known as:  CYANOCOBALAMIN  Take 1,000 mcg by mouth daily.       Allergies  Allergen Reactions  . Lisinopril     cough   Follow-up Information    Follow up with BURNETT,BRENT A, MD. Schedule an appointment as soon as possible for a visit in 1 week.   Specialty:  Family Medicine   Why:  Hospital follow up, repeat BMP, lower extremity doppler   Contact information:   [address] [place] [address] [phone]       Follow up with Dorothy Spark, MD. Call in 1 week.   Specialty:  Cardiology   Why:  Hospital follow up   Contact information:   [place] [id number] [phone]        The results of significant diagnostics from this hospitalization (including imaging, microbiology, ancillary and laboratory) are listed below for reference.    Significant Diagnostic Studies: Dg Chest 2 View  01/28/2016  CLINICAL DATA:  Cough with  weakness and shortness of breath for 2 days. EXAM: CHEST  2 VIEW COMPARISON:  CT and radiographs 01/20/2016. FINDINGS: The heart size and mediastinal contours are stable. There are calcified right hilar lymph nodes. The asymmetric patchy airspace opacities within the right lung on the prior examination have improved. Residual interstitial prominence appears chronic. There are persistent bilateral pleural effusions. Right lung granuloma noted. The bones are unchanged. IMPRESSION: Interval resolution of asymmetric right-sided airspace disease demonstrated on radiographs done 8 days ago. Persistent bilateral pleural effusions with evidence of underlying granulomatous disease. Electronically Signed   By: Richardean Sale M.D.   On: 01/28/2016 11:53   Dg Chest 2 View  01/20/2016  CLINICAL DATA:  Left-sided chest pain shortness of breath for 2 days EXAM: CHEST  2 VIEW COMPARISON:  04/24/2015 FINDINGS: Cardiac shadow is at the upper limits of normal in size. Some platelike atelectasis is noted in the left lung base which may be related to scarring. Mild vascular congestion is again seen with changes of interstitial edema. No focal confluent infiltrate is seen. IMPRESSION: Mild CHF with interstitial edema. Electronically Signed   By: Inez Catalina M.D.   On: 01/20/2016 12:54   Ct Chest Wo Contrast  01/20/2016  CLINICAL DATA:  Pleural effusion on chest x-ray. EXAM: CT CHEST WITHOUT CONTRAST TECHNIQUE: Multidetector CT imaging of the chest was performed following the standard protocol without IV contrast. COMPARISON:  Chest x-ray earlier the same day. FINDINGS: Mediastinum / Lymph Nodes: There is no axillary lymphadenopathy. 9 mm right thyroid nodule noted. No mediastinal lymphadenopathy. Calcified lymph nodes are seen in the subcarinal station and right hilum. The esophagus has normal imaging features. Heart size is upper normal. Coronary artery calcification is noted. Lungs / Pleura: There is central ground-glass  attenuation involving the right upper, middle, and lower lobes with a central predominance. Scattered areas of interlobular septal thickening associated. There is relative sparing at the right lung base. Mild dependent atelectasis or airspace disease is seen in the left lower lobe. Small bilateral pleural effusions are noted. Upper Abdomen: Bilateral adrenal nodules measure in the 1-2 cm size range. These are incompletely visualized but where seen show average attenuation less than 10 Hounsfield units, most consistent with adenomas. MSK / Soft Tissues: Bone windows reveal no worrisome lytic or sclerotic osseous lesions. IMPRESSION: Diffuse central alveolar opacity in the right lung with a somewhat peribronchovascular nodular distribution and peripheral/ basilar sparing. Imaging features are probably related to diffuse right-sided pneumonia given the asymmetry although asymmetric pulmonary edema cannot be entirely excluded. Pulmonary hemorrhage would also  be a consideration. Small bilateral pleural effusions. Bilateral adrenal nodules most consistent with adenomas. Electronically Signed   By: Misty Stanley M.D.   On: 01/20/2016 17:16   Nm Pulmonary Perf And Vent  01/28/2016  CLINICAL DATA:  Shortness of breath and cough.  Initial encounter. EXAM: NUCLEAR MEDICINE VENTILATION - PERFUSION LUNG SCAN TECHNIQUE: Ventilation images were obtained in multiple projections using inhaled aerosol Tc-27m DTPA. Perfusion images were obtained in multiple projections after intravenous injection of Tc-81m MAA. RADIOPHARMACEUTICALS:  0000000 millicuries [id number] DTPA aerosol inhalation and CT chest 01/20/2016 and PA and lateral chest 01/28/2016. 4.1 millicuries [id number] MAA IV COMPARISON:  None. FINDINGS: Ventilation: No focal ventilation defect. Lateral images could not be obtained due to the patient's debilitated condition. Perfusion: No wedge shaped peripheral perfusion defects to suggest acute pulmonary embolism.  Lateral images could not be obtained due the patient's debilitated condition. IMPRESSION: Negative for pulmonary embolus. Electronically Signed   By: Inge Rise M.D.   On: 01/28/2016 15:43    Microbiology: No results found for this or any previous visit (from the past 240 hour(s)).   Labs: Basic Metabolic Panel:  Recent Labs Lab 01/28/16 1131 01/29/16 0430 01/30/16 0239 01/31/16 0524  NA 140 139 138 137  K 4.3 4.0 4.0 4.0  CL 106 103 103 102  CO2 24 27 25 25   GLUCOSE 154* 124* 141* 126*  BUN 26* 24* 25* 26*  CREATININE 2.10* 1.86* 2.01* 2.12*  CALCIUM 9.1 8.9 9.1 9.3   Liver Function Tests: No results for input(s): AST, ALT, ALKPHOS, BILITOT, PROT, ALBUMIN in the last 168 hours. No results for input(s): LIPASE, AMYLASE in the last 168 hours. No results for input(s): AMMONIA in the last 168 hours. CBC:  Recent Labs Lab 01/28/16 1131 01/30/16 0239 01/31/16 0524  WBC 9.2 6.0 6.8  HGB 10.2* 10.7* 11.1*  HCT 32.7* 33.3* 33.0*  MCV 93.2 92.8 90.7  PLT 248 245 243   Cardiac Enzymes: No results for input(s): CKTOTAL, CKMB, CKMBINDEX, TROPONINI in the last 168 hours. BNP: BNP (last 3 results)  Recent Labs  04/23/15 1855 01/20/16 1605 01/28/16 1251  BNP 1076.3* 1905.6* 915.7*    ProBNP (last 3 results)  Recent Labs  04/29/15 1458  PROBNP 113.0*    CBG:  Recent Labs Lab 01/30/16 0651 01/30/16 1139 01/30/16 1659 01/31/16 0636 01/31/16 1116  GLUCAP 118* 142* 121* 110* 156*       Signed:  Keawe Marcello  [facility] 01/31/2016, 12:50 PM

## 2016-01-31 NOTE — Discharge Instructions (Signed)

## 2016-01-31 NOTE — Progress Notes (Signed)
PATIENT ID: [id number] with CAD s/p NSTEMI, HTN, DM, hyperlipidemia, and CKD here with acute on chronic systolic and diastolic heart failure LVEF 35-40%   SUBJECTIVE:  Feels well.  Breathing improved and lower extremity edema improved. He denies chest pain or shortness of breath with ambulation.   PHYSICAL EXAM Filed Vitals:   01/30/16 0432 01/30/16 1249 01/30/16 1959 01/31/16 0515  BP: 118/60 100/57 111/63 123/76  Pulse: 77 76 75 73  Temp: 97.5 F (36.4 C) 97.5 F (36.4 C) 97.5 F (36.4 C) 97.6 F (36.4 C)  TempSrc: Oral Oral Oral Oral  Resp: 18 20 16 18   Height:      Weight: 92.806 kg (204 lb 9.6 oz)   90.992 kg (200 lb 9.6 oz)  SpO2: 94% 94% 93% 95%   General:  Well-appearing.  No acute distress. Neck:  No JVD Lungs:  CTAB>  No crackles, rhonchi or wheezes Heart:  RRR.  No m/r/g Abdomen:  Soft, NT, ND.  +BS Extremities:  WWP.  Trace edema  LABS: Lab Results  Component Value Date   TROPONINI 19.89* 01/22/2016   Results for orders placed or performed during the hospital encounter of 01/28/16 (from the past 24 hour(s))  Glucose, capillary     Status: Abnormal   Collection Time: 01/30/16  4:59 PM  Result Value Ref Range   Glucose-Capillary 121 (H) 65 - 99 mg/dL   Comment 1 Notify RN   CBC     Status: Abnormal   Collection Time: 01/31/16  5:24 AM  Result Value Ref Range   WBC 6.8 4.0 - 10.5 K/uL   RBC 3.64 (L) 4.22 - 5.81 MIL/uL   Hemoglobin 11.1 (L) 13.0 - 17.0 g/dL   HCT 33.0 (L) 39.0 - 52.0 %   MCV 90.7 78.0 - 100.0 fL   MCH 30.5 26.0 - 34.0 pg   MCHC 33.6 30.0 - 36.0 g/dL   RDW 13.8 11.5 - 15.5 %   Platelets 243 150 - 400 K/uL  Basic metabolic panel     Status: Abnormal   Collection Time: 01/31/16  5:24 AM  Result Value Ref Range   Sodium 137 135 - 145 mmol/L   Potassium 4.0 3.5 - 5.1 mmol/L   Chloride 102 101 - 111 mmol/L   CO2 25 22 - 32 mmol/L   Glucose, Bld 126 (H) 65 - 99 mg/dL   BUN 26 (H) 6 - 20 mg/dL   Creatinine, Ser 2.12 (H) 0.61 - 1.24 mg/dL   Calcium 9.3 8.9 - 10.3 mg/dL   GFR calc non Af Amer 27 (L) >60 mL/min   GFR calc Af Amer 31 (L) >60 mL/min   Anion gap 10 5 - 15  Glucose, capillary     Status: Abnormal   Collection Time: 01/31/16  6:36 AM  Result Value Ref Range   Glucose-Capillary 110 (H) 65 - 99 mg/dL  Glucose, capillary     Status: Abnormal   Collection Time: 01/31/16 11:16 AM  Result Value Ref Range   Glucose-Capillary 156 (H) 65 - 99 mg/dL   Comment 1 Notify RN     Intake/Output Summary (Last 24 hours) at 01/31/16 1243 Last data filed at 01/31/16 [id number]  Gross per 24 hour  Intake    960 ml  Output   1275 ml  Net   -315 ml   Telemetry: Sinus rhythm  ASSESSMENT AND PLAN:  Active Problems:   History of non-ST elevation myocardial infarction (NSTEMI)   Essential hypertension   Diabetes  mellitus type 2 in obese (HCC)   Hyperlipidemia   Chronic combined systolic and diastolic heart failure (HCC)   Benign prostatic hyperplasia with urinary obstruction   Shortness of breath   Cough   Acute on chronic kidney failure (HCC)   SOB (shortness of breath)   Leg swelling   # Acute on chronic systolic and diastolic heart failure: LVEF 35-40%.  His volume status is improved.  Renal function remains above baseline, but stable.  He diuresed on oral lasix.  If he is discharged today, would hold PM lasix today and start 80mg  po daily tomorrow.  He will need a BMP in the next few days.  Continue carvedilol.  Resume ramipril when renal function stabilizes.  We will arrange for cardiology follow up within a week.  # CAD: Continue aspirin, atorvastatin, carvedilol, Imdur and ranolazine.  Not an active issue.  # Acute on chronic renal failure:  Kidney function slightly worse today.  Switch to lasix 80 mg po daily tomorrow and BMP check within a few days.  Holding home ramipril as above.   Time spent: 25 minutes-Greater than 50% of this time was spent in counseling, explanation of diagnosis, planning of further management, and  coordination of care.    Kenna Kirn C. Oval Linsey, MD, [facility] 01/31/2016 12:43 PM

## 2016-02-01 ENCOUNTER — Telehealth: Payer: Self-pay | Admitting: Cardiology

## 2016-02-01 DIAGNOSIS — I2510 Atherosclerotic heart disease of native coronary artery without angina pectoris: Secondary | ICD-10-CM | POA: Insufficient documentation

## 2016-02-01 DIAGNOSIS — I340 Nonrheumatic mitral (valve) insufficiency: Secondary | ICD-10-CM | POA: Insufficient documentation

## 2016-02-01 NOTE — Telephone Encounter (Signed)
Patient contacted regarding discharge from [facility] on January 31, 2016.  Patient understands to follow up with provider Richardson Dopp, PA-C on February 08, 2016 at 12:10PM at [facility]. Patient understands discharge instructions? yes Patient understands medications and regiment? yes Patient understands to bring all medications to this visit? yes

## 2016-02-01 NOTE — Telephone Encounter (Signed)
TCM per B Strader 3/6 @ 12:10 w/ Nicki Reaper

## 2016-02-02 DIAGNOSIS — I5042 Chronic combined systolic (congestive) and diastolic (congestive) heart failure: Secondary | ICD-10-CM | POA: Diagnosis not present

## 2016-02-02 DIAGNOSIS — I2510 Atherosclerotic heart disease of native coronary artery without angina pectoris: Secondary | ICD-10-CM | POA: Diagnosis not present

## 2016-02-02 DIAGNOSIS — N184 Chronic kidney disease, stage 4 (severe): Secondary | ICD-10-CM | POA: Diagnosis not present

## 2016-02-05 ENCOUNTER — Encounter: Payer: PPO | Admitting: Cardiology

## 2016-02-07 NOTE — Progress Notes (Addendum)
Cardiology Office Note:    Date:  02/08/2016   ID:  [name], DOB 1930-05-26, MRN [id number]  PCP:  Stephens Shire, MD  Cardiologist:  Dr. Ena Dawley   Electrophysiologist:  n/a  Chief Complaint  Patient presents with  . Hospitalization Follow-up    Admit x 2 in Feb 2017 - 1. OOH MI; 2. a/c CHF/bronchitis/DVT    History of Present Illness:     [name] [name] is a 82 y.o. male with a hx of diabetes, HTN, BPH. He was admitted in [id number] with systolic/diastolic HF in the setting of AF with RVR. Cardiac enzymes were elevated consistent with a non-STEMI. Cardiac catheterization demonstrated moderate disease in the LAD, chronic total occlusion of the mid left circumflex and OM2 branch with right to left and left to left collaterals and moderate disease in the RCA. Medical therapy was recommended. CTO PCI of the LCx could be considered if the patient had symptoms of refractory angina. He converted to NSR on diltiazem drip. EF was 40 at 45% by echocardiogram.   Admitted 5/16 with a/c respiratory failure in the setting of a/c combined systolic and diastolic CHF and hypertensive emergency. Hospital stay c/b by AKI on CKD with IV diuresis, elevated Troponin levels. OP stress testing could be considered. Repeat echo demonstrated worsening LV function with EF 30% felt to be likely depressed from hypertensive crisis. OP sleep test to rule out sleep apnea recommended.   Echo in 8/16 demonstrated normal LVF, inf HK, Gr 1 diastolic dysfunction. Seen by Dr. Ena Dawley 12/10/15. Isosorbide and Ranolazine were added due to increased angina. The patient was having frequent nosebleeds and had decreased Eliquis to QD. This was then Bibb. He was then seen by Dr. Candee Furbish on 12/25/15 with symptomatic hypotension. He had been to the ED and received IVFs one week earlier. BP was 72/50. Imdur had been stopped. BP was still in the 90s when he saw Dr. Marlou Porch. Lasix and Spironolactone were DC'd.  Carvedilol and Ramipril doses were also reduced. He became symptomatic again and received 1L IVFs again in our office.  He was then admitted 2/15-2/19 with an out of hospital non-STEMI. He presented with progressively worsening exertional chest pain and dyspnea and hemoptysis. Troponin was over 65. It was felt that he completed a large inferior MI. Conservative therapy was recommended. Cardiac catheterization was deferred. He is not an ideal candidate for dual antiplatelet therapy. He was maintained on aspirin, statin, beta blocker, ACE inhibitor and Ranexa. Long-acting nitrates were added. He was seen by pulmonology for hemoptysis. CT scan demonstrated findings that were thought to be consistent with pulmonary edema leading to fractured capillary syndrome and hemoptysis. Echo demonstrated EF 35-40%, moderate MR, moderate diastolic dysfunction. Of note, records indicate that viability evaluation could be considered in the outpatient setting to determine if it would be appropriate to pursue cardiac catheterization plus/minus percutaneous coronary intervention.  He was admitted again 2/23-2/26 with worsening cough. Patient was treated with antibiotics. Influenza PCR was negative. Ramipril was held given reports of cough as well as acute kidney injury. He was diuresed for volume excess. He was noted to have a distal right popliteal DVT on lower extremity venous duplex. Case was reviewed with vascular surgery and there was no indication for anticoagulation. Repeat Doppler was recommended in one week. PET scan was negative for PE.  He returns for FU.  He is doing well.  He denies any further chest pain.  Denies significant DOE.  He denies orthopnea, PND.  LE edema is resolved.  Weights have gone down.  Denies any bleeding.     Past Medical History  Diagnosis Date  . HLD (hyperlipidemia)   . Type II diabetes mellitus ([place])   . Paroxysmal atrial fibrillation (HCC)     a. diagnosed on 11/2014 admission.  Spontaneously converted to NSR. Placed on Eliquis;  b. 12/2015 Eliquis d/c'd 2/2 anemia and nosebleeds.  . Chronic combined systolic and diastolic CHF (congestive heart failure) ([place])     a. 11/2014 Echo: EF 40-45%, akinesis of the inferior, inferolateral and inferoseptal walls; overall mild to moderate reduction in LV function; grade 2 diastolic dysfunction; severe LAE; trace AI; moderate MR; mild TR with severely elevated pulmonary pressures. b. EF 25% in 04/2015, possibly due to HTN crisis;  c. Echo 8/16:  Mild LVH, EF 50-55%; d. 01/2016 Echo: EF 35-40%, Gr2 DD, mod MR, sev dil LA  . CAD (coronary artery disease)     a. s/p LHC on 11/24/14 w/ mild-mod dz in LAD. CTO of mLCx and OM2 with R-->L and L-->L collaterals. b. Elevated trop 04/2015 felt due to demand ischemia; c. OOH NSTEMI->conservatively managed.  . Renal insufficiency     a. Elev Cr 04/2015 requiring reduction in Eliquis.  . Obesity   . Hypertensive heart disease   . Moderate mitral regurgitation     a. 11/2014 Echo: Mod MR; b. 07/2015 Echo: Mild MR;  c. 01/2016 Echo: Mod MR - presumed to be ischemic in setting of OOH MI.  . Ischemic cardiomyopathy     a. 01/2016 Echo: EF 35-40%.  . Hemoptysis     a. 01/2016 in setting of OOH MI/pulmonary edema-->resolved prior to d/c.    Past Surgical History  Procedure Laterality Date  . Appendectomy    . Total knee arthroplasty Left 2013  . Tonsillectomy    . Joint replacement    . Cardiac catheterization  11/24/2014  . Left heart catheterization with coronary angiogram N/A 11/24/2014    Procedure: LEFT HEART CATHETERIZATION WITH CORONARY ANGIOGRAM;  Surgeon: Jettie Booze, MD;  Location: [facility] CATH LAB;  Service: Cardiovascular;  Laterality: N/A;  . Esophagogastroduodenoscopy (egd) with propofol N/A 07/16/2015    Procedure: ESOPHAGOGASTRODUODENOSCOPY (EGD) WITH PROPOFOL;  Surgeon: Carol Ada, MD;  Location: [facility] ENDOSCOPY;  Service: Endoscopy;  Laterality: N/A;  . Colonoscopy with propofol N/A  07/16/2015    Procedure: COLONOSCOPY WITH PROPOFOL;  Surgeon: Carol Ada, MD;  Location: [facility] ENDOSCOPY;  Service: Endoscopy;  Laterality: N/A;    Current Medications: Outpatient Prescriptions Prior to Visit  Medication Sig Dispense Refill  . aspirin EC 81 MG tablet Take 1 tablet (81 mg total) by mouth daily. 90 tablet 3  . atorvastatin (LIPITOR) 40 MG tablet Take 1 tablet (40 mg total) by mouth daily at 6 PM. 90 tablet 3  . carvedilol (COREG) 6.25 MG tablet Take 1 tablet (6.25 mg total) by mouth 2 (two) times daily with a meal. 60 tablet 6  . Eluxadoline (VIBERZI) 75 MG TABS Take 75 mg by mouth 2 (two) times daily as needed (diarhea).     . ferrous sulfate 325 (65 FE) MG EC tablet Take twice a day with orange juice. (Patient taking differently: Take 325 mg by mouth every evening. Take twice a day with orange juice.) 120 tablet 3  . furosemide (LASIX) 80 MG tablet Take 1 tablet (80 mg total) by mouth daily. 30 tablet 0  . isosorbide mononitrate (IMDUR) 30 MG 24 hr tablet Take 0.5 tablets (15 mg total)  by mouth daily. 30 tablet 3  . multivitamin-iron-minerals-folic acid (CENTRUM) chewable tablet Chew 1 tablet by mouth daily.    . potassium chloride 20 MEQ TBCR Take 20 mEq by mouth daily. 30 tablet 6  . tamsulosin (FLOMAX) 0.4 MG CAPS capsule Take 0.4 mg by mouth daily as needed (BPH).    . nitroGLYCERIN (NITROSTAT) 0.4 MG SL tablet Place 1 tablet (0.4 mg total) under the tongue every 5 (five) minutes x 3 doses as needed for chest pain. 25 tablet 12  . ranolazine (RANEXA) 1000 MG SR tablet Take 1 tablet (1,000 mg total) by mouth 2 (two) times daily. 180 tablet 3  . vitamin B-12 (CYANOCOBALAMIN) 1000 MCG tablet Take 1,000 mcg by mouth daily.    Marland Kitchen azithromycin (ZITHROMAX) 250 MG tablet Take 1 tablet (250 mg total) by mouth daily. Completion of hospital course (Patient not taking: Reported on 02/08/2016) 2 each 0  . cefUROXime (CEFTIN) 500 MG tablet Take 1 tablet (500 mg total) by mouth 2 (two) times  daily with a meal. (Patient not taking: Reported on 02/08/2016) 4 tablet 0  . HYDROcodone-homatropine (HYCODAN) 5-1.5 MG/5ML syrup Take 5 mLs by mouth every 6 (six) hours as needed for cough. Reported on 02/08/2016     No facility-administered medications prior to visit.     Allergies:   Lisinopril   Social History   Social History  . Marital Status: Married    Spouse Name: N/A  . Number of Children: N/A  . Years of Education: N/A   Social History Main Topics  . Smoking status: Never Smoker   . Smokeless tobacco: Never Used  . Alcohol Use: 8.4 oz/week    7 Shots of liquor, 7 Standard drinks or equivalent per week     Comment: Daily  . Drug Use: No  . Sexual Activity: Not Currently   Other Topics Concern  . None   Social History Narrative     Family History:  The patient's family history includes Cancer (age of onset: 12) in his father; Heart attack in his brother; Heart disease in his brother, sister, and sister; Stroke in his mother. There is no history of Hypertension.   ROS:   Please see the history of present illness.    Review of Systems  Hematologic/Lymphatic: Bruises/bleeds easily.  Gastrointestinal: Positive for diarrhea.  Neurological: Positive for loss of balance.  All other systems reviewed and are negative.   Physical Exam:    VS:  BP 112/60 mmHg  Pulse 74  Ht 5\' 8"  (1.727 m)  Wt 199 lb 6.4 oz (90.447 kg)  BMI 30.33 kg/m2   GEN: Well nourished, well developed, in no acute distress HEENT: normal Neck: no JVD, no masses Cardiac: Normal S1/S2, RRR; no murmurs, rubs, or gallops, no edema  Respiratory:  clear to auscultation bilaterally; no wheezing, rhonchi or rales GI: soft, nontender  MS: no deformity or atrophy Skin: warm and dry  Neuro:  no focal deficits  Psych: Alert and oriented x 3, normal affect  Wt Readings from Last 3 Encounters:  02/08/16 199 lb 6.4 oz (90.447 kg)  01/31/16 200 lb 9.6 oz (90.992 kg)  01/24/16 208 lb 14.4 oz (94.756 kg)       Studies/Labs Reviewed:     EKG:  EKG is  ordered today.  The ekg ordered today demonstrates NSR, HR 73, LAD, IVCD, QTc 480 ms, no change from prior tracing  Recent Labs: 04/29/2015: Pro B Natriuretic peptide (BNP) 113.0* 12/10/2015: ALT 17 01/22/2016: Magnesium  1.9 01/28/2016: B Natriuretic Peptide 915.7* 01/31/2016: BUN 26*; Creatinine, Ser 2.12*; Hemoglobin 11.1*; Platelets 243; Potassium 4.0; Sodium 137   Recent Lipid Panel    Component Value Date/Time   CHOL 87 01/21/2016 0420   TRIG 50 01/21/2016 0420   HDL 50 01/21/2016 0420   CHOLHDL 1.7 01/21/2016 0420   VLDL 10 01/21/2016 0420   LDLCALC 27 01/21/2016 0420    Additional studies/ records that were reviewed today include:   Echo 01/20/16 Mild LVH, EF 35-40%, diffuse HK, grade 2 diastolic dysfunction, moderate MR, severe LAE, normal RVSF, mild RAE, mild TR, PASP 39 mmHg  Echo 07/30/15 Mild LVH, EF 50-55%, inf HK, Gr 1 DD, trivial AI, mild dilated Ao root, MAC, mild MR, severe LAE  Echo 04/25/15 Mild LVH, EF 30%, severe global HK, grade 1 diastolic dysfunction, mild aortic root dilatation (42 mm), MAC, mild MR, severe LAE, mild RAE, mild TR  Echo 03/05/15 EF 40-45%, inferior HK, grade 2 diastolic dysfunction, mild AI, MAC, moderate MR, moderate LAE, PASP 41 mmHg  LHC 11/24/14 LM: Patent LAD: Patent LCx: Occluded with right to left collaterals RCA: Mid moderate diffuse disease PCI: Unsuccessful attempt at PCI of the LCx-chronic total occlusion   ASSESSMENT:     1. Chronic combined systolic and diastolic heart failure ([place])   2. Ischemic cardiomyopathy   3. Coronary artery disease involving native coronary artery of native heart without angina pectoris   4. Paroxysmal atrial fibrillation (HCC)   5. CKD (chronic kidney disease), unspecified stage   6. HLD (hyperlipidemia)   7. Essential hypertension   8. Peroneal DVT (deep venous thrombosis), right (HCC)     PLAN:     In order of problems listed above:  1.  Chronic combined systolic and diastolic heart failure: Volume stable.  Recent admit with OOH MI and worsening LVF with EF 35-40%.  Continue current Rx.  BMET today.  2. Ischemic CM - EF 35-40%. His ACE inhibitor was stopped due to worsening renal function during his recent hospital stay.  Would hold off on resuming his ACE inhibitor for now.  Repeat BMET.  If renal function remains poor, consider adding Hydralazine.  Currently, BP too soft to add Hydralazine.  Continue beta blocker  3. CAD - He has known chronically occluded LCx. Recent admission in 2/16 with probable out of hospital completed inferior MI. This was treated conservatively. There was mention of possibly pursuing viabilitystudy. He has chronic, multiple comorbid morbid conditions.  He would be a poor candidate for dual antiplatelet Rx.  He would be a poor candidate for cardiac cath given CKD and high likelihood for contrast induced nephropathy.  I reviewed his case today with Dr. Meda Coffee. As he is currently asymptomatic, we have recommended proceeding with medical therapy for now. He has recurrent anginal symptoms at follow-up, consider viability study  4. Paroxysmal atrial fibrillation: Maintaining NSR.  He could not tolerate Eliquis secondary to significant epistaxis. Continue ASA.    5. CKD (chronic kidney disease), unspecified stage: Check BMET today.  6. Hyperlipidemia: Continue statin.  7. Essential hypertension: Controlled.  8. DVT - He was noted to have right peroneal vein DVT in the hospital. Anticoagulation was not recommended. Notes from the hospital were reviewed. It was recommended to pursue a repeat duplex study 1 week after discharge to rule out extension of the DVT. I will arrange right lower extremity venous duplex.  Time spent today - 45 minutes (reviewing chart, evaluating the patient, coordinating care).  Face to Face  time > 50%.   Medication Adjustments/Labs and Tests Ordered: Current medicines are  reviewed at length with the patient today.  Concerns regarding medicines are outlined above.  Medication changes, Labs and Tests ordered today are outlined in the Patient Instructions noted below. Patient Instructions  Medication Instructions:  1. A REFILL FOR NTG HAS BEEN SENT IN  2. A REFILL FOR RANEX HAS BEEN SENT IN  Labwork: 1. TODAY BMET  Testing/Procedures: Your physician has requested that you have a RIGHT lower extremity venous duplex DX RIGHT PERONEAL DVT. COMPARE TO RECENT US DONE ON 01/28/16. This test is an ultrasound of the veins in the legs or arms. It looks at venous blood flow that carries blood from the heart to the legs or arms. Allow one hour for a Lower Venous exam. Allow thirty minutes for an Upper Venous exam. There are no restrictions or special instructions.  Follow-Up: 03/25/16 @ 10 AM WITH DR. Meda Coffee   Any Other Special Instructions Will Be Listed Below (If Applicable).  If you need a refill on your cardiac medications before your next appointment, please call your pharmacy.   Signed, Richardson Dopp, PA-C  02/08/2016 1:31 PM    [facility] [place], [place], [place]  [address] Phone: [phone]; Fax: [phone]

## 2016-02-08 ENCOUNTER — Ambulatory Visit (INDEPENDENT_AMBULATORY_CARE_PROVIDER_SITE_OTHER): Payer: PPO | Admitting: Physician Assistant

## 2016-02-08 ENCOUNTER — Encounter: Payer: Self-pay | Admitting: Physician Assistant

## 2016-02-08 VITALS — BP 112/60 | HR 74 | Ht 68.0 in | Wt 199.4 lb

## 2016-02-08 DIAGNOSIS — I2510 Atherosclerotic heart disease of native coronary artery without angina pectoris: Secondary | ICD-10-CM | POA: Diagnosis not present

## 2016-02-08 DIAGNOSIS — I480 Paroxysmal atrial fibrillation: Secondary | ICD-10-CM

## 2016-02-08 DIAGNOSIS — N189 Chronic kidney disease, unspecified: Secondary | ICD-10-CM | POA: Diagnosis not present

## 2016-02-08 DIAGNOSIS — I82491 Acute embolism and thrombosis of other specified deep vein of right lower extremity: Secondary | ICD-10-CM

## 2016-02-08 DIAGNOSIS — I5042 Chronic combined systolic (congestive) and diastolic (congestive) heart failure: Secondary | ICD-10-CM | POA: Diagnosis not present

## 2016-02-08 DIAGNOSIS — I255 Ischemic cardiomyopathy: Secondary | ICD-10-CM

## 2016-02-08 DIAGNOSIS — I82451 Acute embolism and thrombosis of right peroneal vein: Secondary | ICD-10-CM

## 2016-02-08 DIAGNOSIS — I10 Essential (primary) hypertension: Secondary | ICD-10-CM | POA: Diagnosis not present

## 2016-02-08 DIAGNOSIS — E785 Hyperlipidemia, unspecified: Secondary | ICD-10-CM

## 2016-02-08 LAB — BASIC METABOLIC PANEL
BUN: 43 mg/dL — AB (ref 7–25)
CALCIUM: 9.9 mg/dL (ref 8.6–10.3)
CO2: 24 mmol/L (ref 20–31)
Chloride: 98 mmol/L (ref 98–110)
Creat: 2.57 mg/dL — ABNORMAL HIGH (ref 0.70–1.11)
GLUCOSE: 142 mg/dL — AB (ref 65–99)
POTASSIUM: 6 mmol/L — AB (ref 3.5–5.3)
Sodium: 136 mmol/L (ref 135–146)

## 2016-02-08 MED ORDER — RANOLAZINE ER 1000 MG PO TB12: 1000.0000 mg | ORAL_TABLET | Freq: Two times a day (BID) | ORAL | Status: DC

## 2016-02-08 MED ORDER — NITROGLYCERIN 0.4 MG SL SUBL: 0.4000 mg | SUBLINGUAL_TABLET | SUBLINGUAL | Status: AC | PRN

## 2016-02-08 NOTE — Patient Instructions (Addendum)
Medication Instructions:  1. A REFILL FOR NTG HAS BEEN SENT IN  2. A REFILL FOR RANEX HAS BEEN SENT IN  Labwork: 1. TODAY BMET  Testing/Procedures: Your physician has requested that you have a RIGHT lower extremity venous duplex DX RIGHT PERONEAL DVT. COMPARE TO RECENT US DONE ON 01/28/16. This test is an ultrasound of the veins in the legs or arms. It looks at venous blood flow that carries blood from the heart to the legs or arms. Allow one hour for a Lower Venous exam. Allow thirty minutes for an Upper Venous exam. There are no restrictions or special instructions.  Follow-Up: 03/25/16 @ 10 AM WITH DR. Meda Coffee   Any Other Special Instructions Will Be Listed Below (If Applicable).  If you need a refill on your cardiac medications before your next appointment, please call your pharmacy.

## 2016-02-09 ENCOUNTER — Other Ambulatory Visit: Payer: Self-pay

## 2016-02-09 ENCOUNTER — Other Ambulatory Visit (INDEPENDENT_AMBULATORY_CARE_PROVIDER_SITE_OTHER): Payer: PPO | Admitting: *Deleted

## 2016-02-09 ENCOUNTER — Emergency Department (HOSPITAL_COMMUNITY)
Admission: EM | Admit: 2016-02-09 | Discharge: 2016-02-10 | Disposition: A | Discharge status: Home / Self Care | Payer: PPO | Attending: Emergency Medicine | Admitting: Emergency Medicine

## 2016-02-09 ENCOUNTER — Encounter (HOSPITAL_COMMUNITY): Payer: Self-pay | Admitting: *Deleted

## 2016-02-09 ENCOUNTER — Telehealth (HOSPITAL_BASED_OUTPATIENT_CLINIC_OR_DEPARTMENT_OTHER): Payer: Self-pay | Admitting: Emergency Medicine

## 2016-02-09 ENCOUNTER — Telehealth: Payer: Self-pay | Admitting: *Deleted

## 2016-02-09 ENCOUNTER — Ambulatory Visit (HOSPITAL_COMMUNITY)
Admission: RE | Admit: 2016-02-09 | Discharge: 2016-02-09 | Disposition: A | Discharge status: Home / Self Care | Payer: PPO | Source: Ambulatory Visit | Attending: Physician Assistant | Admitting: Physician Assistant

## 2016-02-09 DIAGNOSIS — Z7982 Long term (current) use of aspirin: Secondary | ICD-10-CM | POA: Diagnosis not present

## 2016-02-09 DIAGNOSIS — I480 Paroxysmal atrial fibrillation: Secondary | ICD-10-CM | POA: Insufficient documentation

## 2016-02-09 DIAGNOSIS — E875 Hyperkalemia: Secondary | ICD-10-CM | POA: Diagnosis not present

## 2016-02-09 DIAGNOSIS — N189 Chronic kidney disease, unspecified: Secondary | ICD-10-CM

## 2016-02-09 DIAGNOSIS — N179 Acute kidney failure, unspecified: Secondary | ICD-10-CM

## 2016-02-09 DIAGNOSIS — E119 Type 2 diabetes mellitus without complications: Secondary | ICD-10-CM | POA: Insufficient documentation

## 2016-02-09 DIAGNOSIS — I5043 Acute on chronic combined systolic (congestive) and diastolic (congestive) heart failure: Secondary | ICD-10-CM

## 2016-02-09 DIAGNOSIS — Z79899 Other long term (current) drug therapy: Secondary | ICD-10-CM | POA: Diagnosis not present

## 2016-02-09 DIAGNOSIS — R7989 Other specified abnormal findings of blood chemistry: Secondary | ICD-10-CM | POA: Diagnosis not present

## 2016-02-09 DIAGNOSIS — I5042 Chronic combined systolic (congestive) and diastolic (congestive) heart failure: Secondary | ICD-10-CM | POA: Insufficient documentation

## 2016-02-09 DIAGNOSIS — I110 Hypertensive heart disease with heart failure: Secondary | ICD-10-CM | POA: Insufficient documentation

## 2016-02-09 DIAGNOSIS — R5383 Other fatigue: Secondary | ICD-10-CM

## 2016-02-09 DIAGNOSIS — R748 Abnormal levels of other serum enzymes: Secondary | ICD-10-CM | POA: Diagnosis not present

## 2016-02-09 DIAGNOSIS — I2510 Atherosclerotic heart disease of native coronary artery without angina pectoris: Secondary | ICD-10-CM | POA: Insufficient documentation

## 2016-02-09 DIAGNOSIS — I130 Hypertensive heart and chronic kidney disease with heart failure and stage 1 through stage 4 chronic kidney disease, or unspecified chronic kidney disease: Secondary | ICD-10-CM | POA: Diagnosis not present

## 2016-02-09 DIAGNOSIS — E669 Obesity, unspecified: Secondary | ICD-10-CM | POA: Diagnosis not present

## 2016-02-09 DIAGNOSIS — I82491 Acute embolism and thrombosis of other specified deep vein of right lower extremity: Secondary | ICD-10-CM | POA: Diagnosis not present

## 2016-02-09 DIAGNOSIS — E785 Hyperlipidemia, unspecified: Secondary | ICD-10-CM | POA: Insufficient documentation

## 2016-02-09 DIAGNOSIS — I252 Old myocardial infarction: Secondary | ICD-10-CM | POA: Diagnosis not present

## 2016-02-09 DIAGNOSIS — N184 Chronic kidney disease, stage 4 (severe): Secondary | ICD-10-CM | POA: Insufficient documentation

## 2016-02-09 DIAGNOSIS — I82451 Acute embolism and thrombosis of right peroneal vein: Secondary | ICD-10-CM

## 2016-02-09 DIAGNOSIS — Z9889 Other specified postprocedural states: Secondary | ICD-10-CM | POA: Insufficient documentation

## 2016-02-09 LAB — BASIC METABOLIC PANEL
Anion gap: 10 (ref 5–15)
BUN: 41 mg/dL — AB (ref 6–20)
CALCIUM: 10.4 mg/dL — AB (ref 8.9–10.3)
CO2: 25 mmol/L (ref 22–32)
Chloride: 105 mmol/L (ref 101–111)
Creatinine, Ser: 2.74 mg/dL — ABNORMAL HIGH (ref 0.61–1.24)
GFR calc Af Amer: 23 mL/min — ABNORMAL LOW (ref >60)
GFR, EST NON AFRICAN AMERICAN: 20 mL/min — AB (ref >60)
GLUCOSE: 121 mg/dL — AB (ref 65–99)
Potassium: 6.3 mmol/L (ref 3.5–5.1)
Sodium: 140 mmol/L (ref 135–145)

## 2016-02-09 NOTE — Addendum Note (Signed)
Addended by: Freada Bergeron on: 02/09/2016 05:25 PM   Modules accepted: Orders

## 2016-02-09 NOTE — ED Notes (Signed)
Lab results from MD office in epic. EKG in process.

## 2016-02-09 NOTE — Telephone Encounter (Signed)
DPR on file for wife Vaughan Basta who has been notified of lab results. Advised wife pt needs to d/c K+, limit dietary K+ rich foods, decrease lasix to 40 mg daily. Wife aware pt needs to come in today for STAT BMET. Wife verbalized understanding.

## 2016-02-09 NOTE — ED Notes (Signed)
Pt states he had his blood work done today and his K was 6.3. Sent to ED.

## 2016-02-10 LAB — BASIC METABOLIC PANEL
Anion gap: 12 (ref 5–15)
BUN: 42 mg/dL — ABNORMAL HIGH (ref 6–20)
CALCIUM: 10.2 mg/dL (ref 8.9–10.3)
CHLORIDE: 103 mmol/L (ref 101–111)
CO2: 24 mmol/L (ref 22–32)
CREATININE: 2.81 mg/dL — AB (ref 0.61–1.24)
GFR calc Af Amer: 22 mL/min — ABNORMAL LOW (ref >60)
GFR calc non Af Amer: 19 mL/min — ABNORMAL LOW (ref >60)
GLUCOSE: 128 mg/dL — AB (ref 65–99)
Potassium: 5.5 mmol/L — ABNORMAL HIGH (ref 3.5–5.1)
Sodium: 139 mmol/L (ref 135–145)

## 2016-02-10 LAB — CBC WITH DIFFERENTIAL/PLATELET
BASOS PCT: 0 %
Basophils Absolute: 0 10*3/uL (ref 0.0–0.1)
Eosinophils Absolute: 0.1 10*3/uL (ref 0.0–0.7)
Eosinophils Relative: 1 %
HEMATOCRIT: 36.9 % — AB (ref 39.0–52.0)
HEMOGLOBIN: 11.8 g/dL — AB (ref 13.0–17.0)
LYMPHS ABS: 2.4 10*3/uL (ref 0.7–4.0)
LYMPHS PCT: 27 %
MCH: 29.4 pg (ref 26.0–34.0)
MCHC: 32 g/dL (ref 30.0–36.0)
MCV: 92 fL (ref 78.0–100.0)
MONO ABS: 0.5 10*3/uL (ref 0.1–1.0)
MONOS PCT: 5 %
NEUTROS ABS: 6 10*3/uL (ref 1.7–7.7)
NEUTROS PCT: 67 %
Platelets: 231 10*3/uL (ref 150–400)
RBC: 4.01 MIL/uL — ABNORMAL LOW (ref 4.22–5.81)
RDW: 14 % (ref 11.5–15.5)
WBC: 9 10*3/uL (ref 4.0–10.5)

## 2016-02-10 MED ORDER — SODIUM CHLORIDE 0.9 % IV BOLUS (SEPSIS)
500.0000 mL | Freq: Once | INTRAVENOUS | Status: AC
  Administered 2016-02-10: 500 mL via INTRAVENOUS

## 2016-02-10 MED ORDER — SODIUM POLYSTYRENE SULFONATE 15 GM/60ML PO SUSP
15.0000 g | Freq: Once | ORAL | Status: AC
  Administered 2016-02-10: 15 g via ORAL
  Filled 2016-02-10: qty 60

## 2016-02-10 NOTE — ED Provider Notes (Signed)
CSN: [id number]     Arrival date & time 02/09/16  1752 History   First MD Initiated Contact with Patient 02/09/16 2321     Chief Complaint  Patient presents with  . Abnormal Lab     (Consider location/radiation/quality/duration/timing/severity/associated sxs/prior Treatment) The history is provided by the patient and medical records. No language interpreter was used.   [name] [name] is a 82 y.o. male  with a hx of recent NSTEMI 01-20-16, STEMI Dec 2015, CHF Combined Systolic/Diastolic, CKD IV, Obesity, HTN, DM II presents to the Emergency Department because he received a phone call from his physician stating that his potassium was elevated.  Patient reports increased fatigue today but no other symptoms. No chest pain or shortness of breath, palpitations, syncope or near syncope.  Patient reports that he takes Lasix twice a day and was recently started on potassium supplement. He reports he's been compliant with all his medications.    Record review shows the patient was admitted on 01/28/2016 for CHF exacerbation.  Patient denies swelling in his legs.  Past Medical History  Diagnosis Date  . HLD (hyperlipidemia)   . Type II diabetes mellitus ([place])   . Paroxysmal atrial fibrillation ([facility])     a. diagnosed on 11/2014 admission. Spontaneously converted to NSR. Placed on Eliquis;  b. 12/2015 Eliquis d/c'd 2/2 anemia and nosebleeds.  . Chronic combined systolic and diastolic CHF (congestive heart failure) ([place])     a. 11/2014 Echo: EF 40-45%, akinesis of the inferior, inferolateral and inferoseptal walls; overall mild to moderate reduction in LV function; grade 2 diastolic dysfunction; severe LAE; trace AI; moderate MR; mild TR with severely elevated pulmonary pressures. b. EF 25% in 04/2015, possibly due to HTN crisis;  c. Echo 8/16:  Mild LVH, EF 50-55%; d. 01/2016 Echo: EF 35-40%, Gr2 DD, mod MR, sev dil LA  . CAD (coronary artery disease)     a. s/p LHC on 11/24/14 w/ mild-mod dz in LAD. CTO of  mLCx and OM2 with R-->L and L-->L collaterals. b. Elevated trop 04/2015 felt due to demand ischemia; c. OOH NSTEMI->conservatively managed.  . Renal insufficiency     a. Elev Cr 04/2015 requiring reduction in Eliquis.  . Obesity   . Hypertensive heart disease   . Moderate mitral regurgitation     a. 11/2014 Echo: Mod MR; b. 07/2015 Echo: Mild MR;  c. 01/2016 Echo: Mod MR - presumed to be ischemic in setting of OOH MI.  . Ischemic cardiomyopathy     a. 01/2016 Echo: EF 35-40%.  . Hemoptysis     a. 01/2016 in setting of OOH MI/pulmonary edema-->resolved prior to d/c.   Past Surgical History  Procedure Laterality Date  . Appendectomy    . Total knee arthroplasty Left 2013  . Tonsillectomy    . Joint replacement    . Cardiac catheterization  11/24/2014  . Left heart catheterization with coronary angiogram N/A 11/24/2014    Procedure: LEFT HEART CATHETERIZATION WITH CORONARY ANGIOGRAM;  Surgeon: Jettie Booze, MD;  Location: [facility] CATH LAB;  Service: Cardiovascular;  Laterality: N/A;  . Esophagogastroduodenoscopy (egd) with propofol N/A 07/16/2015    Procedure: ESOPHAGOGASTRODUODENOSCOPY (EGD) WITH PROPOFOL;  Surgeon: Carol Ada, MD;  Location: [facility] ENDOSCOPY;  Service: Endoscopy;  Laterality: N/A;  . Colonoscopy with propofol N/A 07/16/2015    Procedure: COLONOSCOPY WITH PROPOFOL;  Surgeon: Carol Ada, MD;  Location: [facility] ENDOSCOPY;  Service: Endoscopy;  Laterality: N/A;   Family History  Problem Relation Age of Onset  . Cancer  Father 85    Deceased  . Stroke Mother     Deceased  . Heart attack Brother     Deceased. Had cath with stents followed by PE  . Hypertension Neg Hx   . Heart disease Brother   . Heart disease Sister   . Heart disease Sister    Social History  Substance Use Topics  . Smoking status: Never Smoker   . Smokeless tobacco: Never Used  . Alcohol Use: 8.4 oz/week    7 Shots of liquor, 7 Standard drinks or equivalent per week     Comment: Daily    Review of  Systems  Constitutional: Positive for fatigue. Negative for fever, diaphoresis, appetite change and unexpected weight change.  HENT: Negative for mouth sores.   Eyes: Negative for visual disturbance.  Respiratory: Negative for cough, chest tightness, shortness of breath and wheezing.   Cardiovascular: Negative for chest pain.  Gastrointestinal: Negative for nausea, vomiting, abdominal pain, diarrhea and constipation.  Endocrine: Negative for polydipsia, polyphagia and polyuria.  Genitourinary: Negative for dysuria, urgency, frequency and hematuria.  Musculoskeletal: Negative for back pain and neck stiffness.  Skin: Negative for rash.  Allergic/Immunologic: Negative for immunocompromised state.  Neurological: Negative for syncope, light-headedness and headaches.  Hematological: Does not bruise/bleed easily.  Psychiatric/Behavioral: Negative for sleep disturbance. The patient is not nervous/anxious.       Allergies  Lisinopril  Home Medications   Prior to Admission medications   Medication Sig Start Date End Date Taking? Authorizing Provider  aspirin EC 81 MG tablet Take 1 tablet (81 mg total) by mouth daily. 12/10/15   Dorothy Spark, MD  atorvastatin (LIPITOR) 40 MG tablet Take 1 tablet (40 mg total) by mouth daily at 6 PM. 04/29/15   Liliane Shi, PA-C  carvedilol (COREG) 6.25 MG tablet Take 1 tablet (6.25 mg total) by mouth 2 (two) times daily with a meal. 01/24/16   Rogelia Mire, NP  Cyanocobalamin (VITAMIN B 12 PO) Take 5,000 mcg by mouth daily.    Historical Provider, MD  Eluxadoline (VIBERZI) 75 MG TABS Take 75 mg by mouth 2 (two) times daily as needed (diarhea).     Historical Provider, MD  ferrous sulfate 325 (65 FE) MG EC tablet Take twice a day with orange juice. Patient taking differently: Take 325 mg by mouth every evening. Take twice a day with orange juice. 08/18/15   Wyatt Portela, MD  furosemide (LASIX) 80 MG tablet Take 1 tablet (80 mg total) by mouth daily.  02/01/16   Maryann Mikhail, DO  isosorbide mononitrate (IMDUR) 30 MG 24 hr tablet Take 0.5 tablets (15 mg total) by mouth daily. 01/24/16   Rogelia Mire, NP  multivitamin-iron-minerals-folic acid (CENTRUM) chewable tablet Chew 1 tablet by mouth daily.    Historical Provider, MD  nitroGLYCERIN (NITROSTAT) 0.4 MG SL tablet Place 1 tablet (0.4 mg total) under the tongue every 5 (five) minutes x 3 doses as needed for chest pain. 02/08/16   Liliane Shi, PA-C  potassium chloride 20 MEQ TBCR Take 20 mEq by mouth daily. 01/24/16   Rogelia Mire, NP  ranolazine (RANEXA) 1000 MG SR tablet Take 1 tablet (1,000 mg total) by mouth 2 (two) times daily. 02/08/16   Liliane Shi, PA-C  tamsulosin (FLOMAX) 0.4 MG CAPS capsule Take 0.4 mg by mouth daily as needed (BPH).    Historical Provider, MD   BP 152/71 mmHg  Pulse 70  Temp(Src) 97.7 F (36.5 C) (Oral)  Resp 18  SpO2 100% Physical Exam  Constitutional: He appears well-developed and well-nourished. No distress.  Awake, alert, nontoxic appearance  HENT:  Head: Normocephalic and atraumatic.  Mouth/Throat: Oropharynx is clear and moist. No oropharyngeal exudate.  Eyes: Conjunctivae are normal. No scleral icterus.  Neck: Normal range of motion. Neck supple.  Cardiovascular: Normal rate, regular rhythm, normal heart sounds and intact distal pulses.   No murmur heard. Pulmonary/Chest: Effort normal and breath sounds normal. No respiratory distress. He has no wheezes.  Equal chest expansion  Abdominal: Soft. Bowel sounds are normal. He exhibits no mass. There is no tenderness. There is no rebound and no guarding.  Musculoskeletal: Normal range of motion. He exhibits no edema.  Neurological: He is alert.  Speech is clear and goal oriented Moves extremities without ataxia  Skin: Skin is warm and dry. He is not diaphoretic.  Psychiatric: He has a normal mood and affect.  Nursing note and vitals reviewed.   ED Course  Procedures (including  critical care time) Labs Review Labs Reviewed  CBC WITH DIFFERENTIAL/PLATELET - Abnormal; Notable for the following:    RBC 4.01 (*)    Hemoglobin 11.8 (*)    HCT 36.9 (*)    All other components within normal limits  BASIC METABOLIC PANEL - Abnormal; Notable for the following:    Potassium 5.5 (*)    Glucose, Bld 128 (*)    BUN 42 (*)    Creatinine, Ser 2.81 (*)    GFR calc non Af Amer 19 (*)    GFR calc Af Amer 22 (*)    All other components within normal limits      EKG Interpretation   Date/Time:  Wednesday February 10 2016 01:17:30 EST Ventricular Rate:  69 PR Interval:  183 QRS Duration: 147 QT Interval:  457 QTC Calculation: 490 R Axis:   -49 Text Interpretation:  Sinus rhythm Left bundle branch block No significant  change since last tracing Confirmed by WARD,  DO, KRISTEN [id number]) on  02/10/2016 1:21:18 AM      MDM   Final diagnoses:  Serum potassium elevated  Other fatigue   [name] resents with hyperkalemia. Potassium checked at his physician's office this afternoon resulted at 6.3.  Repeat labs here in the emergency room show potassium of 5.5.  Concern patient's creatinine has worsened from a 1.8-2.8 with the last week. He has had a steadily decreasing GFR.  Gentle fluid bolus given here in the emergency department. Kayexalate ordered.    Discussed need for admission with patient and wife at bedside.  Pt is adamant that he is not staying for admission.  He agrees to see his PCP in the AM for repeat labs.  Discussed reasons to return immediately to the emergency room.  Suspect pt was dry as he has had steadily increasing creatinine over the last several months.   We discussed the nature and purpose, risks and benefits, as well as, the alternatives of treatment. Time was given to allow the opportunity to ask questions and consider their options, and after the discussion, the patient decided to refuse the offerred treatment. The patient was informed that refusal  could lead to, but was not limited to, death, permanent disability, or severe pain.  Wife is comfortable with pt's discharge home.  Prior to refusing, I determined that the patient had the capacity to make their decision and understood the consequences of that decision. After refusal, I made every reasonable opportunity to treat them to the best  of my ability.  The patient was notified that they may return to the emergency department at any time for further treatment.    The patient was discussed with and seen by Dr. Leonides Schanz who agrees with the treatment plan.     Jarrett Soho Youcef Klas, PA-C 02/10/16 [place], DO 02/10/16 [id number]

## 2016-02-10 NOTE — Discharge Instructions (Signed)
1. Medications: usual home medications 2. Treatment: rest, drink plenty of fluids, stop taking your potassium supplement 3. Follow Up: Please followup with your primary doctor TODAY for repeat labs; Please return to the ER for worsening symptoms, chest pain, shortness of breath or other concerns.   Hyperkalemia Hyperkalemia is when you have too much potassium in your blood. Potassium is normally removed (excreted) from your body by your kidneys. If there is too much potassium in your blood, it can affect your heart's ability to function.  CAUSES  Hyperkalemia may be caused by:   Taking in too much potassium. You can do this by:  Using salt substitutes. They contain large amounts of potassium.  Taking potassium supplements.  Eating foods high in potassium.  Excreting too little potassium. This can happen if:  Your kidneys are not working properly. Kidney (renal) disease, including short- or long-term renal failure, is a very common cause of hyperkalemia.  You are taking medicines that lower your excretion of potassium.  You have Addison disease.  You have a urinary tract blockage, such as kidney stones.  You are on treatment to mechanically clean your blood (dialysis) and you skip a treatment.  Releasing a high amount of potassium from your cells into your blood. This can happen with:  Injury to muscles (rhabdomyolysis) or other tissues. Most potassium is stored in your muscles.  Severe burns or infections.  Acidic blood plasma (acidosis). Acidosis can result from many diseases, such as uncontrolled diabetes. RISK FACTORS The most common risk factor of hyperkalemia is kidney disease. Other risk factors of hyperkalemia include:  Addison disease. This is a condition where your glands do not produce enough hormones.  Alcoholism or heavy drug use.   Using certain blood pressure medicines, such as angiotensin-converting enzyme (ACE) inhibitors, angiotensin II receptor blockers  (ARBs), or potassium-sparing diuretics such as spironolactone.  Severe injury or burn. SIGNS AND SYMPTOMS  Oftentimes, there are no signs or symptoms of hyperkalemia. However, when your potassium level becomes high enough, you may experience symptoms such as:  Irregular or very slow heartbeat.  Nausea.  Fatigue.  Tingling of the skin or numbness of the hands or feet.  Muscle weakness.  Fatigue.  Not being able to move (paralysis). You may not have any symptoms of hyperkalemia.  DIAGNOSIS  Hyperkalemia may be diagnosed by:  Physical exam.  Blood tests.  ECG (electrocardiogram).  Discussion of prescription and non-prescription drug use. TREATMENT  Treatment for hyperkalemia is often directed at the underlying cause. In some instances, treatment may include:   Insulin.  Glucose (sugar) and water solution given through a vein (intravenous or IV).  Dialysis.  Medicines to remove the potassium from your body.  Medicines to move calcium from your bloodstream into your tissues. HOME CARE INSTRUCTIONS   Take medicines only as directed by your health care provider.  Do not take any supplements, natural products, herbs, or vitamins without reviewing them with your health care provider. Certain supplements and natural food products can have high amounts of potassium.  Limit your alcohol intake as directed by your health care provider.  Stop illegal drug use. If you need help quitting, ask your health care provider.  Keep all follow-up visits as directed by your health care provider. This is important.  If you have kidney disease, you may need to follow a low potassium diet. A dietitian can help educate you on low potassium foods. SEEK MEDICAL CARE IF:   You notice an irregular or very slow  heartbeat.  You feel light-headed.  You feel weak.  You are nauseous.  You have tingling or numbness in your hands or feet. SEEK IMMEDIATE MEDICAL CARE IF:   You have  shortness of breath.  You have chest pain or discomfort.  You pass out.  You have muscle paralysis. MAKE SURE YOU:   Understand these instructions.  Will watch your condition.  Will get help right away if you are not doing well or get worse.   This information is not intended to replace advice given to you by your health care provider. Make sure you discuss any questions you have with your health care provider.   Document Released: 11/11/2002 Document Revised: 12/12/2014 Document Reviewed: 02/26/2014 Elsevier Interactive Patient Education [employer].

## 2016-02-12 DIAGNOSIS — E875 Hyperkalemia: Secondary | ICD-10-CM | POA: Diagnosis not present

## 2016-02-19 ENCOUNTER — Telehealth: Payer: Self-pay | Admitting: Cardiology

## 2016-02-19 NOTE — Telephone Encounter (Signed)
New message      Talk to the nurse.  Want a name of a kidney doctor that Dr Meda Coffee recommends.

## 2016-02-19 NOTE — Telephone Encounter (Signed)
Pts wife is calling to inform Dr Meda Coffee that the pts PCP wants to refer the pt to a Nephrologist for worsening renal function, but per the pt and wife his PCP was recently bought out by [facility], and he wants to refer the pt to a Nephrologist there.  Per the wife and the pt, they would like to keep and maintain all care through the [place].  Pt and wife were wanting Dr Meda Coffee to advise on who she highly recommeds as a Nephrologist in our system, to have the pts PCP refer too.  Informed both parties that Dr Meda Coffee is currently out of the office today, but I will route this message to her for further review and recommendation and follow-up with them thereafter.  Both verbalized understanding and agrees with this plan.

## 2016-02-19 NOTE — Telephone Encounter (Signed)
I would recommend Dr Edrick Oh

## 2016-02-19 NOTE — Telephone Encounter (Signed)
Left a detailed message on the pts spouse VM on her cell phone, as pt requested for me to do for she is on the DPR, to inform her that Dr Meda Coffee recommends that the pts PCP refer the pt to Dr Edrick Oh with [facility], to establish care as his Nephrologist.  Also left a detailed message on the spouses confirmed VM that the number there is [phone] [facility]) and she needs to have PCP put the referral in for he advised on this and has all of the pts records.  Left a detailed message on her confirmed VM to call back with any additional questions.

## 2016-03-04 DIAGNOSIS — J069 Acute upper respiratory infection, unspecified: Secondary | ICD-10-CM | POA: Diagnosis not present

## 2016-03-04 DIAGNOSIS — N189 Chronic kidney disease, unspecified: Secondary | ICD-10-CM | POA: Diagnosis not present

## 2016-03-25 ENCOUNTER — Ambulatory Visit (INDEPENDENT_AMBULATORY_CARE_PROVIDER_SITE_OTHER): Payer: PPO | Admitting: Cardiology

## 2016-03-25 ENCOUNTER — Encounter: Payer: Self-pay | Admitting: Cardiology

## 2016-03-25 VITALS — BP 120/78 | HR 68 | Ht 68.0 in | Wt 194.6 lb

## 2016-03-25 DIAGNOSIS — E875 Hyperkalemia: Secondary | ICD-10-CM | POA: Diagnosis not present

## 2016-03-25 DIAGNOSIS — I2510 Atherosclerotic heart disease of native coronary artery without angina pectoris: Secondary | ICD-10-CM | POA: Diagnosis not present

## 2016-03-25 DIAGNOSIS — I5023 Acute on chronic systolic (congestive) heart failure: Secondary | ICD-10-CM

## 2016-03-25 DIAGNOSIS — I255 Ischemic cardiomyopathy: Secondary | ICD-10-CM | POA: Diagnosis not present

## 2016-03-25 DIAGNOSIS — I5043 Acute on chronic combined systolic (congestive) and diastolic (congestive) heart failure: Secondary | ICD-10-CM

## 2016-03-25 DIAGNOSIS — N184 Chronic kidney disease, stage 4 (severe): Secondary | ICD-10-CM

## 2016-03-25 DIAGNOSIS — I480 Paroxysmal atrial fibrillation: Secondary | ICD-10-CM

## 2016-03-25 DIAGNOSIS — I2583 Coronary atherosclerosis due to lipid rich plaque: Secondary | ICD-10-CM

## 2016-03-25 LAB — BASIC METABOLIC PANEL
BUN: 37 mg/dL — ABNORMAL HIGH (ref 7–25)
CO2: 24 mmol/L (ref 20–31)
Calcium: 9.5 mg/dL (ref 8.6–10.3)
Chloride: 103 mmol/L (ref 98–110)
Creat: 2.63 mg/dL — ABNORMAL HIGH (ref 0.70–1.11)
Glucose, Bld: 115 mg/dL — ABNORMAL HIGH (ref 65–99)
Potassium: 5 mmol/L (ref 3.5–5.3)
Sodium: 138 mmol/L (ref 135–146)

## 2016-03-25 LAB — HEPATIC FUNCTION PANEL
ALT: 11 U/L (ref 9–46)
AST: 14 U/L (ref 10–35)
Albumin: 4 g/dL (ref 3.6–5.1)
Alkaline Phosphatase: 47 U/L (ref 40–115)
Bilirubin, Direct: 0.2 mg/dL (ref <=0.2)
Indirect Bilirubin: 0.6 mg/dL (ref 0.2–1.2)
Total Bilirubin: 0.8 mg/dL (ref 0.2–1.2)
Total Protein: 6.9 g/dL (ref 6.1–8.1)

## 2016-03-25 NOTE — Progress Notes (Signed)
Patient ID: [name] [name], male   DOB: 05/22/1930, 82 y.o.   MRN: [id number]      Cardiology Office Note:    Date:  03/25/2016   ID:  [name] [name], DOB March 29, 1930, MRN [id number]  PCP:  Stephens Shire, MD  Cardiologist:  Dr. Ena Dawley   Electrophysiologist:  n/a  Chief Complaint  Patient presents with  . Congestive Heart Failure    f/u   History of Present Illness:     [name] [name] is a 82 y.o. male with a hx of diabetes, HTN, BPH. He was admitted in [id number] with systolic/diastolic HF in the setting of AF with RVR. Cardiac enzymes were elevated consistent with a non-STEMI. Cardiac catheterization demonstrated moderate disease in the LAD, chronic total occlusion of the mid left circumflex and OM2 branch with right to left and left to left collaterals and moderate disease in the RCA. Medical therapy was recommended. CTO PCI of the LCx could be considered if the patient had symptoms of refractory angina. He converted to NSR on diltiazem drip. EF was 40 at 45% by echocardiogram.   Admitted 5/16 with a/c respiratory failure in the setting of a/c combined systolic and diastolic CHF and hypertensive emergency. Hospital stay c/b by AKI on CKD with IV diuresis, elevated Troponin levels. OP stress testing could be considered. Repeat echo demonstrated worsening LV function with EF 30% felt to be likely depressed from hypertensive crisis. OP sleep test to rule out sleep apnea recommended.   Echo in 8/16 demonstrated normal LVF, inf HK, Gr 1 diastolic dysfunction. Seen by Dr. Ena Dawley 12/10/15. Isosorbide and Ranolazine were added due to increased angina. The patient was having frequent nosebleeds and had decreased Eliquis to QD. This was then [place]. He was then seen by Dr. Candee Furbish on 12/25/15 with symptomatic hypotension. He had been to the ED and received IVFs one week earlier. BP was 72/50. Imdur had been stopped. BP was still in the 90s when he saw Dr. Marlou Porch. Lasix and  Spironolactone were DC'd. Carvedilol and Ramipril doses were also reduced. He became symptomatic again and received 1L IVFs again in our office.  He was then admitted 2/15-2/19 with an out of hospital non-STEMI. He presented with progressively worsening exertional chest pain and dyspnea and hemoptysis. Troponin was over 65. It was felt that he completed a large inferior MI. Conservative therapy was recommended. Cardiac catheterization was deferred. He is not an ideal candidate for dual antiplatelet therapy. He was maintained on aspirin, statin, beta blocker, ACE inhibitor and Ranexa. Long-acting nitrates were added. He was seen by pulmonology for hemoptysis. CT scan demonstrated findings that were thought to be consistent with pulmonary edema leading to fractured capillary syndrome and hemoptysis. Echo demonstrated EF 35-40%, moderate MR, moderate diastolic dysfunction. Of note, records indicate that viability evaluation could be considered in the outpatient setting to determine if it would be appropriate to pursue cardiac catheterization plus/minus percutaneous coronary intervention.  He was admitted again 2/23-2/26 with worsening cough. Patient was treated with antibiotics. Influenza PCR was negative. Ramipril was held given reports of cough as well as acute kidney injury. He was diuresed for volume excess. He was noted to have a distal right popliteal DVT on lower extremity venous duplex. Case was reviewed with vascular surgery and there was no indication for anticoagulation. Repeat Doppler was recommended in one week. PET scan was negative for PE.  03/25/2016 - 2 months follow-up, patient has been doing well in the interim he continues using his diuretics  and has lost another 5 pounds since the last visit. He denies any lower extremity edema, orthopnea paroxysmal nocturnal dyspnea.He has been still recovering and hasn't been very active but he's walking in the mall without any chest pain or shortness of  breath. Denies palpitations or syncope. He states that he is using ranolazine only once a day as twice a day dose every same constipation.  Past Medical History  Diagnosis Date  . HLD (hyperlipidemia)   . Type II diabetes mellitus ([place])   . Paroxysmal atrial fibrillation ([facility])     a. diagnosed on 11/2014 admission. Spontaneously converted to NSR. Placed on Eliquis;  b. 12/2015 Eliquis d/c'd 2/2 anemia and nosebleeds.  . Chronic combined systolic and diastolic CHF (congestive heart failure) ([place])     a. 11/2014 Echo: EF 40-45%, akinesis of the inferior, inferolateral and inferoseptal walls; overall mild to moderate reduction in LV function; grade 2 diastolic dysfunction; severe LAE; trace AI; moderate MR; mild TR with severely elevated pulmonary pressures. b. EF 25% in 04/2015, possibly due to HTN crisis;  c. Echo 8/16:  Mild LVH, EF 50-55%; d. 01/2016 Echo: EF 35-40%, Gr2 DD, mod MR, sev dil LA  . CAD (coronary artery disease)     a. s/p LHC on 11/24/14 w/ mild-mod dz in LAD. CTO of mLCx and OM2 with R-->L and L-->L collaterals. b. Elevated trop 04/2015 felt due to demand ischemia; c. OOH NSTEMI->conservatively managed.  . Renal insufficiency     a. Elev Cr 04/2015 requiring reduction in Eliquis.  . Obesity   . Hypertensive heart disease   . Moderate mitral regurgitation     a. 11/2014 Echo: Mod MR; b. 07/2015 Echo: Mild MR;  c. 01/2016 Echo: Mod MR - presumed to be ischemic in setting of OOH MI.  . Ischemic cardiomyopathy     a. 01/2016 Echo: EF 35-40%.  . Hemoptysis     a. 01/2016 in setting of OOH MI/pulmonary edema-->resolved prior to d/c.    Past Surgical History  Procedure Laterality Date  . Appendectomy    . Total knee arthroplasty Left 2013  . Tonsillectomy    . Joint replacement    . Cardiac catheterization  11/24/2014  . Left heart catheterization with coronary angiogram N/A 11/24/2014    Procedure: LEFT HEART CATHETERIZATION WITH CORONARY ANGIOGRAM;  Surgeon: Jettie Booze,  MD;  Location: [facility] CATH LAB;  Service: Cardiovascular;  Laterality: N/A;  . Esophagogastroduodenoscopy (egd) with propofol N/A 07/16/2015    Procedure: ESOPHAGOGASTRODUODENOSCOPY (EGD) WITH PROPOFOL;  Surgeon: Carol Ada, MD;  Location: [facility] ENDOSCOPY;  Service: Endoscopy;  Laterality: N/A;  . Colonoscopy with propofol N/A 07/16/2015    Procedure: COLONOSCOPY WITH PROPOFOL;  Surgeon: Carol Ada, MD;  Location: [facility] ENDOSCOPY;  Service: Endoscopy;  Laterality: N/A;    Current Medications: Outpatient Prescriptions Prior to Visit  Medication Sig Dispense Refill  . aspirin EC 81 MG tablet Take 1 tablet (81 mg total) by mouth daily. 90 tablet 3  . atorvastatin (LIPITOR) 40 MG tablet Take 1 tablet (40 mg total) by mouth daily at 6 PM. 90 tablet 3  . carvedilol (COREG) 6.25 MG tablet Take 1 tablet (6.25 mg total) by mouth 2 (two) times daily with a meal. 60 tablet 6  . Cyanocobalamin (VITAMIN B 12 PO) Take 5,000 mcg by mouth daily.    . Eluxadoline (VIBERZI) 75 MG TABS Take 75 mg by mouth 2 (two) times daily as needed (diarhea).     . furosemide (LASIX) 80 MG tablet  Take 1 tablet (80 mg total) by mouth daily. 30 tablet 0  . isosorbide mononitrate (IMDUR) 30 MG 24 hr tablet Take 0.5 tablets (15 mg total) by mouth daily. 30 tablet 3  . multivitamin-iron-minerals-folic acid (CENTRUM) chewable tablet Chew 1 tablet by mouth daily.    . nitroGLYCERIN (NITROSTAT) 0.4 MG SL tablet Place 1 tablet (0.4 mg total) under the tongue every 5 (five) minutes x 3 doses as needed for chest pain. 25 tablet 3  . potassium chloride 20 MEQ TBCR Take 20 mEq by mouth daily. 30 tablet 6  . ranolazine (RANEXA) 1000 MG SR tablet Take 1 tablet (1,000 mg total) by mouth 2 (two) times daily. 180 tablet 3  . tamsulosin (FLOMAX) 0.4 MG CAPS capsule Take 0.4 mg by mouth daily as needed (BPH).    . ferrous sulfate 325 (65 FE) MG EC tablet Take twice a day with orange juice. (Patient taking differently: Take 325 mg by mouth every evening.  Take twice a day with orange juice.) 120 tablet 3   No facility-administered medications prior to visit.     Allergies:   Lisinopril   Social History   Social History  . Marital Status: Married    Spouse Name: N/A  . Number of Children: N/A  . Years of Education: N/A   Social History Main Topics  . Smoking status: Never Smoker   . Smokeless tobacco: Never Used  . Alcohol Use: 8.4 oz/week    7 Shots of liquor, 7 Standard drinks or equivalent per week     Comment: Daily  . Drug Use: No  . Sexual Activity: Not Currently   Other Topics Concern  . None   Social History Narrative     Family History:  The patient's family history includes Cancer (age of onset: 75) in his father; Heart attack in his brother; Heart disease in his brother, sister, and sister; Stroke in his mother. There is no history of Hypertension.   ROS:   Please see the history of present illness.    Review of Systems  Hematologic/Lymphatic: Bruises/bleeds easily.  Gastrointestinal: Positive for diarrhea.  Neurological: Positive for loss of balance.  All other systems reviewed and are negative.   Physical Exam:    VS:  BP 120/78 mmHg  Pulse 68  Ht 5\' 8"  (1.727 m)  Wt 194 lb 9.6 oz (88.27 kg)  BMI 29.60 kg/m2   GEN: Well nourished, well developed, in no acute distress HEENT: normal Neck: no JVD, no masses Cardiac: Normal [id number], RRR; 2/6 systolic murmurs, rubs, or gallops, no edema  Respiratory:  clear to auscultation bilaterally; no wheezing, rhonchi or rales GI: soft, nontender  MS: no deformity or atrophy Skin: warm and dry  Neuro:  no focal deficits  Psych: Alert and oriented x 3, normal affect  Wt Readings from Last 3 Encounters:  03/25/16 194 lb 9.6 oz (88.27 kg)  02/08/16 199 lb 6.4 oz (90.447 kg)  01/31/16 200 lb 9.6 oz (90.992 kg)      Studies/Labs Reviewed:     EKG:  EKG is  ordered today.  The ekg ordered today demonstrates NSR, HR 73, LAD, IVCD, QTc 480 ms, no change from prior  tracing  Recent Labs: 04/29/2015: Pro B Natriuretic peptide (BNP) 113.0* 12/10/2015: ALT 17 01/22/2016: Magnesium 1.9 01/28/2016: B Natriuretic Peptide 915.7* 02/10/2016: BUN 42*; Creatinine, Ser 2.81*; Hemoglobin 11.8*; Platelets 231; Potassium 5.5*; Sodium 139   Recent Lipid Panel    Component Value Date/Time   CHOL 87  01/21/2016 0420   TRIG 50 01/21/2016 0420   HDL 50 01/21/2016 0420   CHOLHDL 1.7 01/21/2016 0420   VLDL 10 01/21/2016 0420   LDLCALC 27 01/21/2016 0420    Additional studies/ records that were reviewed today include:   Echo 01/20/16 Mild LVH, EF 35-40%, diffuse HK, grade 2 diastolic dysfunction, moderate MR, severe LAE, normal RVSF, mild RAE, mild TR, PASP 39 mmHg  Echo 07/30/15 Mild LVH, EF 50-55%, inf HK, Gr 1 DD, trivial AI, mild dilated Ao root, MAC, mild MR, severe LAE  Echo 04/25/15 Mild LVH, EF 30%, severe global HK, grade 1 diastolic dysfunction, mild aortic root dilatation (42 mm), MAC, mild MR, severe LAE, mild RAE, mild TR  Echo 03/05/15 EF 40-45%, inferior HK, grade 2 diastolic dysfunction, mild AI, MAC, moderate MR, moderate LAE, PASP 41 mmHg  LHC 11/24/14 LM: Patent LAD: Patent LCx: Occluded with right to left collaterals RCA: Mid moderate diffuse disease PCI: Unsuccessful attempt at PCI of the LCx-chronic total occlusion   ASSESSMENT/PLAN:     1. Acute on chronic combined systolic and diastolic heart failure: patient continues to lose weight, he appears euvolemic and is asymptomatic we'll continue current regimen with aspirin and carvedilol and isosorbide mononitrate.  2. Ischemic CM - EF 35-40%. His ACE inhibitor was stopped due to worsening renal function during his recent hospital stay.  Would hold off on resuming his ACE inhibitor for now.  We will continue carvedilol and Imdur. It consists of blood pressure unable to hydralazine.  3. CAD - He has known chronically occluded LCx. Recent admission in 2/16 with probable out of hospital completed  inferior MI. This was treated conservatively. There was mention of possibly pursuing viabilitystudy. He has chronic, multiple comorbid morbid conditions.  He would be a poor candidate for dual antiplatelet Rx.  He would be a poor candidate for cardiac cath given CKD and high likelihood for contrast induced nephropathy.  He is currently asymptomatic, therefore we will hold off on a viability study. We will continue ranolazine and Imdur, he is encouraged to continue exercising.  4. Paroxysmal atrial fibrillation: Maintaining NSR.  He could not tolerate Eliquis secondary to significant epistaxis. Continue ASA.    5. CKD (chronic kidney disease), unspecified stage: Check BMET today.  6. Hyperlipidemia: Continue statin.  7. Essential hypertension: Controlled.  8. DVT - He was noted to have right peroneal vein DVT in the hospital. Anticoagulation was not recommended. Notes from the hospital were reviewed. It was recommended to pursue a repeat duplex study 1 week after discharge to rule out extension of the DVT. I will arrange right lower extremity venous duplex.  9. Hyperkalemia - we will recheck today.  Time spent today - 45 minutes (reviewing chart, evaluating the patient, coordinating care).  Face to Face time > 50%.  Signed, Dorothy Spark, MD  03/25/2016 10:26 AM    [address], [place], [place]  [address] Phone: [phone]; Fax: [phone]

## 2016-03-25 NOTE — Patient Instructions (Signed)
Medication Instructions:  None  Labwork: BMET, LFTs today  Testing/Procedures: None  Follow-Up: Your physician recommends that you schedule a follow-up appointment in: 3 months with Dr. Meda Coffee.    Any Other Special Instructions Will Be Listed Below (If Applicable).     If you need a refill on your cardiac medications before your next appointment, please call your pharmacy.

## 2016-04-06 DIAGNOSIS — N183 Chronic kidney disease, stage 3 (moderate): Secondary | ICD-10-CM | POA: Diagnosis not present

## 2016-04-06 DIAGNOSIS — I2510 Atherosclerotic heart disease of native coronary artery without angina pectoris: Secondary | ICD-10-CM | POA: Diagnosis not present

## 2016-04-06 DIAGNOSIS — I129 Hypertensive chronic kidney disease with stage 1 through stage 4 chronic kidney disease, or unspecified chronic kidney disease: Secondary | ICD-10-CM | POA: Diagnosis not present

## 2016-04-14 ENCOUNTER — Other Ambulatory Visit (HOSPITAL_BASED_OUTPATIENT_CLINIC_OR_DEPARTMENT_OTHER): Payer: PPO

## 2016-04-14 DIAGNOSIS — E1129 Type 2 diabetes mellitus with other diabetic kidney complication: Secondary | ICD-10-CM | POA: Diagnosis not present

## 2016-04-14 DIAGNOSIS — D631 Anemia in chronic kidney disease: Secondary | ICD-10-CM | POA: Diagnosis not present

## 2016-04-14 DIAGNOSIS — I129 Hypertensive chronic kidney disease with stage 1 through stage 4 chronic kidney disease, or unspecified chronic kidney disease: Secondary | ICD-10-CM | POA: Diagnosis not present

## 2016-04-14 DIAGNOSIS — I509 Heart failure, unspecified: Secondary | ICD-10-CM | POA: Diagnosis not present

## 2016-04-14 DIAGNOSIS — I2510 Atherosclerotic heart disease of native coronary artery without angina pectoris: Secondary | ICD-10-CM | POA: Diagnosis not present

## 2016-04-14 DIAGNOSIS — D692 Other nonthrombocytopenic purpura: Secondary | ICD-10-CM | POA: Diagnosis not present

## 2016-04-14 DIAGNOSIS — D472 Monoclonal gammopathy: Secondary | ICD-10-CM

## 2016-04-14 DIAGNOSIS — I130 Hypertensive heart and chronic kidney disease with heart failure and stage 1 through stage 4 chronic kidney disease, or unspecified chronic kidney disease: Secondary | ICD-10-CM | POA: Diagnosis not present

## 2016-04-14 DIAGNOSIS — I4891 Unspecified atrial fibrillation: Secondary | ICD-10-CM | POA: Diagnosis not present

## 2016-04-14 DIAGNOSIS — Z6830 Body mass index (BMI) 30.0-30.9, adult: Secondary | ICD-10-CM | POA: Diagnosis not present

## 2016-04-14 DIAGNOSIS — N401 Enlarged prostate with lower urinary tract symptoms: Secondary | ICD-10-CM | POA: Diagnosis not present

## 2016-04-14 DIAGNOSIS — D509 Iron deficiency anemia, unspecified: Secondary | ICD-10-CM

## 2016-04-14 DIAGNOSIS — I8290 Acute embolism and thrombosis of unspecified vein: Secondary | ICD-10-CM | POA: Diagnosis not present

## 2016-04-14 DIAGNOSIS — Z1389 Encounter for screening for other disorder: Secondary | ICD-10-CM | POA: Diagnosis not present

## 2016-04-14 LAB — COMPREHENSIVE METABOLIC PANEL
ALBUMIN: 3.6 g/dL (ref 3.5–5.0)
ALK PHOS: 59 U/L (ref 40–150)
ALT: 23 U/L (ref 0–55)
ANION GAP: 7 meq/L (ref 3–11)
AST: 20 U/L (ref 5–34)
BILIRUBIN TOTAL: 0.68 mg/dL (ref 0.20–1.20)
BUN: 20.3 mg/dL (ref 7.0–26.0)
CALCIUM: 9.5 mg/dL (ref 8.4–10.4)
CHLORIDE: 105 meq/L (ref 98–109)
CO2: 30 mEq/L — ABNORMAL HIGH (ref 22–29)
CREATININE: 1.9 mg/dL — AB (ref 0.7–1.3)
EGFR: 31 mL/min/{1.73_m2} — ABNORMAL LOW (ref >90)
Glucose: 152 mg/dl — ABNORMAL HIGH (ref 70–140)
Potassium: 4.6 mEq/L (ref 3.5–5.1)
Sodium: 141 mEq/L (ref 136–145)
Total Protein: 6.7 g/dL (ref 6.4–8.3)

## 2016-04-14 LAB — CBC WITH DIFFERENTIAL/PLATELET
BASO%: 0.7 % (ref 0.0–2.0)
BASOS ABS: 0 10*3/uL (ref 0.0–0.1)
EOS%: 2.9 % (ref 0.0–7.0)
Eosinophils Absolute: 0.2 10*3/uL (ref 0.0–0.5)
HEMATOCRIT: 35.1 % — AB (ref 38.4–49.9)
HEMOGLOBIN: 11.4 g/dL — AB (ref 13.0–17.1)
LYMPH#: 1.3 10*3/uL (ref 0.9–3.3)
LYMPH%: 23.8 % (ref 14.0–49.0)
MCH: 30.6 pg (ref 27.2–33.4)
MCHC: 32.5 g/dL (ref 32.0–36.0)
MCV: 94.4 fL (ref 79.3–98.0)
MONO#: 0.5 10*3/uL (ref 0.1–0.9)
MONO%: 9 % (ref 0.0–14.0)
NEUT#: 3.5 10*3/uL (ref 1.5–6.5)
NEUT%: 63.6 % (ref 39.0–75.0)
PLATELETS: 152 10*3/uL (ref 140–400)
RBC: 3.72 10*6/uL — ABNORMAL LOW (ref 4.20–5.82)
RDW: 15.4 % — AB (ref 11.0–14.6)
WBC: 5.6 10*3/uL (ref 4.0–10.3)

## 2016-04-14 LAB — IRON AND TIBC
%SAT: 27 % (ref 20–55)
IRON: 68 ug/dL (ref 42–163)
TIBC: 248 ug/dL (ref 202–409)
UIBC: 180 ug/dL (ref 117–376)

## 2016-04-14 LAB — FERRITIN: FERRITIN: 398 ng/mL — AB (ref 22–316)

## 2016-04-15 LAB — KAPPA/LAMBDA LIGHT CHAINS
IG LAMBDA FREE LIGHT CHAIN: 21.65 mg/L (ref 5.71–26.30)
Ig Kappa Free Light Chain: 35.39 mg/L — ABNORMAL HIGH (ref 3.30–19.40)
Kappa/Lambda FluidC Ratio: 1.63 (ref 0.26–1.65)

## 2016-04-19 LAB — MULTIPLE MYELOMA PANEL, SERUM
ALBUMIN SERPL ELPH-MCNC: 3.5 g/dL (ref 2.9–4.4)
ALPHA 1: 0.2 g/dL (ref 0.0–0.4)
ALPHA2 GLOB SERPL ELPH-MCNC: 0.7 g/dL (ref 0.4–1.0)
Albumin/Glob SerPl: 1.4 (ref 0.7–1.7)
B-GLOBULIN SERPL ELPH-MCNC: 1 g/dL (ref 0.7–1.3)
GAMMA GLOB SERPL ELPH-MCNC: 0.7 g/dL (ref 0.4–1.8)
GLOBULIN, TOTAL: 2.6 g/dL (ref 2.2–3.9)
IGG (IMMUNOGLOBIN G), SERUM: 737 mg/dL (ref 700–1600)
IgA, Qn, Serum: 376 mg/dL (ref 61–437)
IgM, Qn, Serum: 65 mg/dL (ref 15–143)
M PROTEIN SERPL ELPH-MCNC: 0.2 g/dL — AB
Total Protein: 6.1 g/dL (ref 6.0–8.5)

## 2016-04-21 ENCOUNTER — Telehealth: Payer: Self-pay | Admitting: Oncology

## 2016-04-21 ENCOUNTER — Ambulatory Visit: Payer: PPO | Admitting: Cardiology

## 2016-04-21 ENCOUNTER — Ambulatory Visit (HOSPITAL_BASED_OUTPATIENT_CLINIC_OR_DEPARTMENT_OTHER): Payer: PPO | Admitting: Oncology

## 2016-04-21 VITALS — BP 125/55 | HR 78 | Temp 97.7°F | Resp 18 | Ht 68.0 in | Wt 230.0 lb

## 2016-04-21 DIAGNOSIS — D472 Monoclonal gammopathy: Secondary | ICD-10-CM

## 2016-04-21 NOTE — Progress Notes (Signed)
Hematology and Oncology Follow Up Visit  [name] [name] [id number] 1930-03-24 82 y.o. 04/21/2016 1:35 PM BURNETT,BRENT A, MDBurnett, Michel Santee, MD   Principle Diagnosis: 82 year old gentleman with:  1. Multifactorial anemia with an element of iron deficiency as well as renal insufficiency diagnosed in July 2016. He presented with a hemoglobin of 10.4, iron of 35 and saturation of 17%.  2. 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 He presented with M spike of 0.2 g/dL and slowly elevated IgA level of 450.   Current therapy: He is currently on oral iron replacement at 325 mg once a day.  Interim History:  Mr. [name] [name] presents today for a follow-up visit. Since his last visit, he was hospitalized briefly for hyperkalemia which have currently have resolved. He also was found to have worsening renal insufficiency which have improved at this time. This occurred in February 2017 and since then he have done very well. He have remained very active and performs activities of daily living. He is able to drive and work in the yard with his wife.   He continues to be on iron supplements and have tolerated it well. He did not feel any major difference in his symptoms at this time. He does not report any worsening chest pain or shortness of breath. Has not reported any changes in his activity level. He does not report any hematochezia or melena. Does not report any hemoptysis or hematemesis.   He does not report any headaches, blurry vision, syncope or seizures. He does not report any fevers, chills, sweats. He does not report any chest pain, palpitation orthopnea or leg edema. He does not report any cough, hemoptysis or wheezing. He does not report any nausea, vomiting but does report occasional diarrhea and abdominal pain. He does not report any frequency, urgency or hesitancy. Does not report any skeletal complaints. Remaining review of systems unremarkable.   Medications: I have reviewed the patient's current  medications.  Current Outpatient Prescriptions  Medication Sig Dispense Refill  . aspirin EC 81 MG tablet Take 1 tablet (81 mg total) by mouth daily. 90 tablet 3  . atorvastatin (LIPITOR) 40 MG tablet Take 1 tablet (40 mg total) by mouth daily at 6 PM. 90 tablet 3  . carvedilol (COREG) 6.25 MG tablet Take 1 tablet (6.25 mg total) by mouth 2 (two) times daily with a meal. 60 tablet 6  . Cyanocobalamin (VITAMIN B 12 PO) Take 5,000 mcg by mouth daily.    . ferrous sulfate 325 (65 FE) MG tablet Take 325 mg by mouth daily with breakfast. Takes daily with orange juice    . furosemide (LASIX) 80 MG tablet Take 1 tablet (80 mg total) by mouth daily. 30 tablet 0  . isosorbide mononitrate (IMDUR) 30 MG 24 hr tablet Take 0.5 tablets (15 mg total) by mouth daily. 30 tablet 3  . multivitamin-iron-minerals-folic acid (CENTRUM) chewable tablet Chew 1 tablet by mouth daily.    . nitroGLYCERIN (NITROSTAT) 0.4 MG SL tablet Place 1 tablet (0.4 mg total) under the tongue every 5 (five) minutes x 3 doses as needed for chest pain. 25 tablet 3  . ranolazine (RANEXA) 1000 MG SR tablet Take 1 tablet (1,000 mg total) by mouth 2 (two) times daily. 180 tablet 3  . spironolactone (ALDACTONE) 25 MG tablet Take 25 mg by mouth daily.    . tamsulosin (FLOMAX) 0.4 MG CAPS capsule Take 0.4 mg by mouth daily as needed (BPH).    . Eluxadoline (VIBERZI) 75 MG TABS Take 75  mg by mouth 2 (two) times daily as needed (diarhea).      No current facility-administered medications for this visit.     Allergies:  Allergies  Allergen Reactions  . Lisinopril     cough    Past Medical History, Surgical history, Social history, and Family History were reviewed and updated.   Physical Exam: Blood pressure 125/55, pulse 78, temperature 97.7 F (36.5 C), temperature source Oral, resp. rate 18, height 5\' 8"  (1.727 m), weight 230 lb (104.327 kg), SpO2 99 %. ECOG: 1 General appearance: alert and cooperative appeared without  distress. Head: Normocephalic, without obvious abnormality oral ulcers or lesions. Neck: no adenopathy Lymph nodes: Cervical, supraclavicular, and axillary nodes normal. Heart:regular rate and rhythm, S1, S2 normal, no murmur, click, rub or gallop Lung:chest clear, no wheezing, rales, normal symmetric air entry Abdomin: soft, non-tender, without masses or organomegaly shifting dullness or ascites. EXT:no erythema, induration, or nodules   Lab Results: Lab Results  Component Value Date   WBC 5.6 04/14/2016   HGB 11.4* 04/14/2016   HCT 35.1* 04/14/2016   MCV 94.4 04/14/2016   PLT 152 04/14/2016     Chemistry      Component Value Date/Time   NA 141 04/14/2016 1412   NA 138 03/25/2016 1048   K 4.6 04/14/2016 1412   K 5.0 03/25/2016 1048   CL 103 03/25/2016 1048   CO2 30* 04/14/2016 1412   CO2 24 03/25/2016 1048   BUN 20.3 04/14/2016 1412   BUN 37* 03/25/2016 1048   CREATININE 1.9* 04/14/2016 1412   CREATININE 2.63* 03/25/2016 1048   CREATININE 2.81* 02/10/2016 0000      Component Value Date/Time   CALCIUM 9.5 04/14/2016 1412   CALCIUM 9.5 03/25/2016 1048   ALKPHOS 59 04/14/2016 1412   ALKPHOS 47 03/25/2016 1048   AST 20 04/14/2016 1412   AST 14 03/25/2016 1048   ALT 23 04/14/2016 1412   ALT 11 03/25/2016 1048   BILITOT 0.68 04/14/2016 1412   BILITOT 0.8 03/25/2016 1048      Results for RESHAD, PERKOWSKI (MRN [id number]) as of 04/21/2016 13:24  Ref. Range 04/14/2016 14:12  IgA/Immunoglobulin A, Serum Latest Ref Range: 61-437 mg/dL 376   Results for DAEMIEN, BUSSE (MRN [id number]) as of 04/21/2016 13:24  Ref. Range 10/09/2015 11:00  IgA Latest Ref Range: 68-379 mg/dL 450 (H)    Results for JAGUAR, SAFKO (MRN [id number]) as of 04/21/2016 13:24  Ref. Range 04/14/2016 14:12  M Protein SerPl Elph-Mcnc Latest Ref Range: Not Observed g/dL 0.2 (H)    Results for SHAMMOND, PASSON (MRN [id number]) as of 04/21/2016 13:24  Ref. Range 04/14/2016 14:13  Iron Latest Ref Range: 42-163  ug/dL 68  UIBC Latest Ref Range: 117-376 ug/dL 180  TIBC Latest Ref Range: 202-409 ug/dL 248  %SAT Latest Ref Range: 20-55 % 27  Ferritin Latest Ref Range: 22-316 ng/ml 398 (H)   Impression and Plan:   82 year old gentleman with the following issues:  1. Multifactorial anemia with element of iron deficiency as well as renal insufficiency. His hemoglobin improved to 11.4 which have been stable for the last 6 months. I do not recommend any further therapy at this time but he might require growth factor support if his anemia worsens.  Plasma cell disorder is less likely the cause of his anemia.  2. Elevated IgA monoclonal protein is very mild elevation and likely represents MGUS rather than active myeloma. Serum protein electrophoresis obtained on 04/14/2016 showed his IgA level was within normal range  and continue to have very low M spike of 0.2 g/dL. The plan is to continue with observation and surveillance and repeat these protein studies in 12 months.  3. Renal insufficiency: Likely related to long-standing hypertension and diabetes rather than a plasma cell disorder.  4. Follow-up: Will be in 12 months.    Zola Button, MD 5/18/20171:35 PM

## 2016-04-21 NOTE — Telephone Encounter (Signed)
Gave and printed appt shced and avs fo rpt for May 2018

## 2016-05-11 ENCOUNTER — Other Ambulatory Visit: Payer: Self-pay | Admitting: Nephrology

## 2016-05-11 ENCOUNTER — Telehealth: Payer: Self-pay | Admitting: Oncology

## 2016-05-11 DIAGNOSIS — N183 Chronic kidney disease, stage 3 unspecified: Secondary | ICD-10-CM

## 2016-05-11 NOTE — Telephone Encounter (Signed)
Contacted referring provider informed pt was just seen on 04/21/16 if any new finding arise pls reach out to the office.

## 2016-05-17 IMAGING — CR DG CHEST 1V PORT
1 series · 1 of 1 positions shown · non-contrast
Comparison: None.

CLINICAL DATA: Acute onset of respiratory distress and shortness of
breath. Audible rales and rhonchi. Initial encounter.

EXAM:
PORTABLE CHEST - 1 VIEW

[AP]
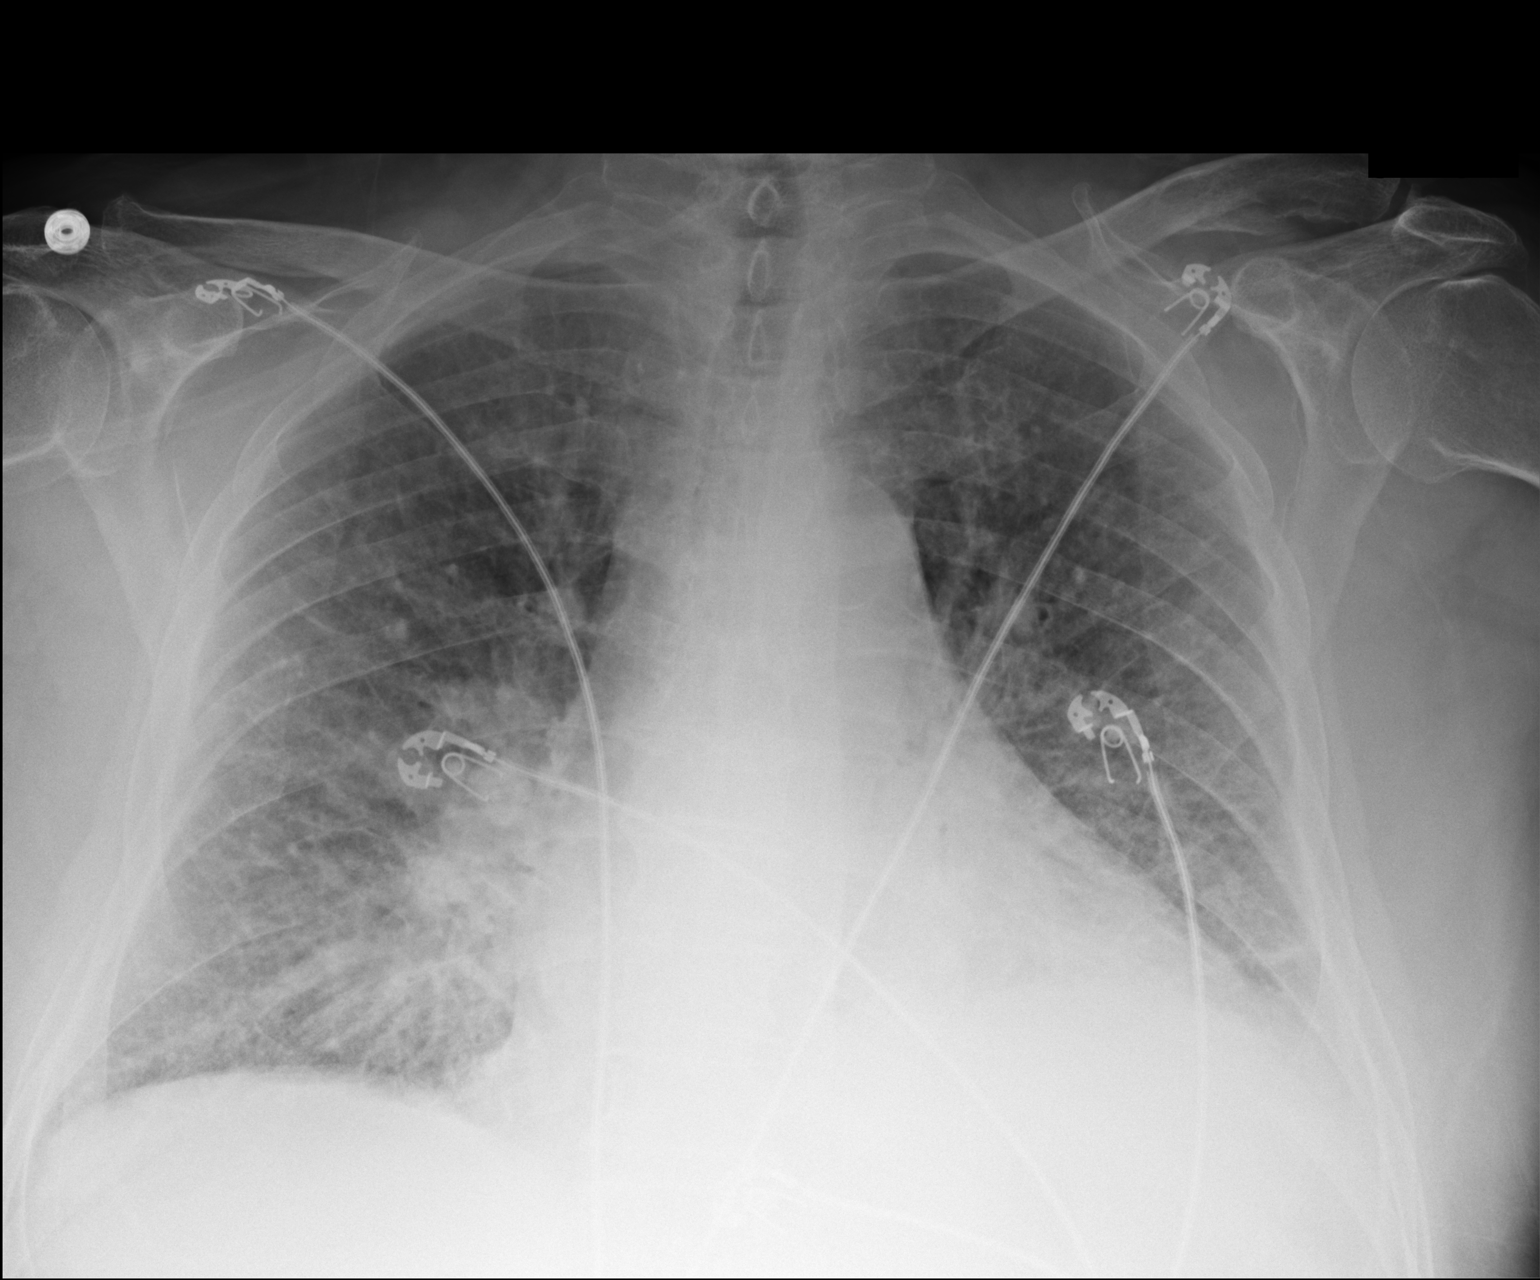

[1 of 1 positions shown; findings below may reference images not displayed]

FINDINGS: The lungs are well-aerated. Vascular congestion is noted. Bilateral
central airspace opacities raise concern for pulmonary edema. A
small left pleural effusion is suspected. No pneumothorax is seen.

The cardiomediastinal silhouette is borderline enlarged. No acute
osseous abnormalities are seen.
IMPRESSION: Vascular congestion and borderline cardiomegaly. Bilateral central
airspace opacities raise concern for pulmonary edema. Suspect small
left pleural effusion.

## 2016-05-19 ENCOUNTER — Ambulatory Visit
Admission: RE | Admit: 2016-05-19 | Discharge: 2016-05-19 | Disposition: A | Discharge status: Home / Self Care | Payer: PPO | Source: Ambulatory Visit | Attending: Nephrology | Admitting: Nephrology

## 2016-05-19 DIAGNOSIS — N183 Chronic kidney disease, stage 3 unspecified: Secondary | ICD-10-CM

## 2016-05-27 ENCOUNTER — Other Ambulatory Visit: Payer: Self-pay | Admitting: Physician Assistant

## 2016-06-14 ENCOUNTER — Telehealth: Payer: Self-pay

## 2016-06-14 NOTE — Telephone Encounter (Signed)
Sent phone note to [facility] to advise for samples.

## 2016-06-15 ENCOUNTER — Telehealth: Payer: Self-pay | Admitting: Cardiology

## 2016-06-15 DIAGNOSIS — E785 Hyperlipidemia, unspecified: Secondary | ICD-10-CM

## 2016-06-15 DIAGNOSIS — I10 Essential (primary) hypertension: Secondary | ICD-10-CM

## 2016-06-15 DIAGNOSIS — I5042 Chronic combined systolic (congestive) and diastolic (congestive) heart failure: Secondary | ICD-10-CM

## 2016-06-15 DIAGNOSIS — I2510 Atherosclerotic heart disease of native coronary artery without angina pectoris: Secondary | ICD-10-CM

## 2016-06-15 MED ORDER — RANOLAZINE ER 1000 MG PO TB12: 1000.0000 mg | ORAL_TABLET | Freq: Two times a day (BID) | ORAL | Status: DC

## 2016-06-15 NOTE — Telephone Encounter (Signed)
Follow-up     Patient calling the office for samples of medication:   1.  What medication and dosage are you requesting samples for?Ranexa 1000mg  po daily  2.  Are you currently out of this medication? Out completely

## 2016-06-15 NOTE — Telephone Encounter (Signed)
Pts wife requesting for a month supply of pts Ranexa 1000 mg po bid to be sent to [place].  Informed the pts wife we are currently out of samples, but when the pt comes into the office next Thursday to see Dr Meda Coffee, they should ask then, to see if we have any in stock.  Pts wife verbalized understanding and agrees with this plan.  Wife gracious for all the assistance provided.

## 2016-06-15 NOTE — Telephone Encounter (Deleted)
[name] [name], [place], LPN    Caller: Kaylyn Layer (Yesterday, 9:04 AM)            [name] called this morning in regards to getting samples of Ranexa 1000mg  for her husband. She says that Dr. Meda Coffee told her it was ok for him to take 1 tablet PO daily due to the 2 tablets causing him constipation. Please advise and give her a call back.   Thanks  Fernande Bras CMA

## 2016-06-21 ENCOUNTER — Telehealth: Payer: Self-pay | Admitting: Cardiology

## 2016-06-21 NOTE — Telephone Encounter (Signed)
New message      Patient calling the office for samples of medication:   1.  What medication and dosage are you requesting samples for? Ranexa 1000 mg po twice daily  2.  Are you currently out of this medication? no

## 2016-06-21 NOTE — Telephone Encounter (Signed)
Called pt to inform him that I was leaving samples of Ranexa 1000 mg tablets, taken twice daily, enough to last for three weeks, at the front desk for pt to pick up. I advised the pt that if he has any other problems, questions or concerns to call the office. Pt verbalized understanding.  Lot# [id number]  Exp: 03/2019

## 2016-06-23 ENCOUNTER — Encounter (INDEPENDENT_AMBULATORY_CARE_PROVIDER_SITE_OTHER): Payer: Self-pay

## 2016-06-23 ENCOUNTER — Ambulatory Visit (INDEPENDENT_AMBULATORY_CARE_PROVIDER_SITE_OTHER): Payer: PPO | Admitting: Cardiology

## 2016-06-23 ENCOUNTER — Encounter: Payer: Self-pay | Admitting: Cardiology

## 2016-06-23 VITALS — BP 132/72 | HR 72 | Ht 68.0 in | Wt 203.0 lb

## 2016-06-23 DIAGNOSIS — E785 Hyperlipidemia, unspecified: Secondary | ICD-10-CM | POA: Diagnosis not present

## 2016-06-23 DIAGNOSIS — I5023 Acute on chronic systolic (congestive) heart failure: Secondary | ICD-10-CM | POA: Diagnosis not present

## 2016-06-23 DIAGNOSIS — Z79899 Other long term (current) drug therapy: Secondary | ICD-10-CM

## 2016-06-23 DIAGNOSIS — I10 Essential (primary) hypertension: Secondary | ICD-10-CM

## 2016-06-23 DIAGNOSIS — I480 Paroxysmal atrial fibrillation: Secondary | ICD-10-CM

## 2016-06-23 DIAGNOSIS — I2510 Atherosclerotic heart disease of native coronary artery without angina pectoris: Secondary | ICD-10-CM

## 2016-06-23 DIAGNOSIS — I255 Ischemic cardiomyopathy: Secondary | ICD-10-CM

## 2016-06-23 MED ORDER — TAMSULOSIN HCL 0.4 MG PO CAPS: 0.4000 mg | ORAL_CAPSULE | Freq: Every day | ORAL | Status: DC | PRN

## 2016-06-23 NOTE — Progress Notes (Signed)
Patient ID: [name] [name], male   DOB: 01-28-1930, 82 y.o.   MRN: [id number]      Cardiology Office Note:    Date:  06/23/2016   ID:  [name] [name], DOB July 23, 1930, MRN [id number]  PCP:  Stephens Shire, MD  Cardiologist:  Dr. Ena Dawley   Electrophysiologist:  n/a  No chief complaint on file.  History of Present Illness:     [name] [name] is a 82 y.o. male with a hx of diabetes, HTN, BPH. He was admitted in [id number] with systolic/diastolic HF in the setting of AF with RVR. Cardiac enzymes were elevated consistent with a non-STEMI. Cardiac catheterization demonstrated moderate disease in the LAD, chronic total occlusion of the mid left circumflex and OM2 branch with right to left and left to left collaterals and moderate disease in the RCA. Medical therapy was recommended. CTO PCI of the LCx could be considered if the patient had symptoms of refractory angina. He converted to NSR on diltiazem drip. EF was 40 at 45% by echocardiogram.   Admitted 5/16 with a/c respiratory failure in the setting of a/c combined systolic and diastolic CHF and hypertensive emergency. Hospital stay c/b by AKI on CKD with IV diuresis, elevated Troponin levels. OP stress testing could be considered. Repeat echo demonstrated worsening LV function with EF 30% felt to be likely depressed from hypertensive crisis. OP sleep test to rule out sleep apnea recommended.   Echo in 8/16 demonstrated normal LVF, inf HK, Gr 1 diastolic dysfunction. Seen by Dr. Ena Dawley 12/10/15. Isosorbide and Ranolazine were added due to increased angina. The patient was having frequent nosebleeds and had decreased Eliquis to QD. This was then [place]. He was then seen by Dr. Candee Furbish on 12/25/15 with symptomatic hypotension. He had been to the ED and received IVFs one week earlier. BP was 72/50. Imdur had been stopped. BP was still in the 90s when he saw Dr. Marlou Porch. Lasix and Spironolactone were DC'd. Carvedilol and Ramipril doses  were also reduced. He became symptomatic again and received 1L IVFs again in our office.  He was then admitted 2/15-2/19 with an out of hospital non-STEMI. He presented with progressively worsening exertional chest pain and dyspnea and hemoptysis. Troponin was over 65. It was felt that he completed a large inferior MI. Conservative therapy was recommended. Cardiac catheterization was deferred. He is not an ideal candidate for dual antiplatelet therapy. He was maintained on aspirin, statin, beta blocker, ACE inhibitor and Ranexa. Long-acting nitrates were added. He was seen by pulmonology for hemoptysis. CT scan demonstrated findings that were thought to be consistent with pulmonary edema leading to fractured capillary syndrome and hemoptysis. Echo demonstrated EF 35-40%, moderate MR, moderate diastolic dysfunction. Of note, records indicate that viability evaluation could be considered in the outpatient setting to determine if it would be appropriate to pursue cardiac catheterization plus/minus percutaneous coronary intervention.  He was admitted again 2/23-2/26 with worsening cough. Patient was treated with antibiotics. Influenza PCR was negative. Ramipril was held given reports of cough as well as acute kidney injury. He was diuresed for volume excess. He was noted to have a distal right popliteal DVT on lower extremity venous duplex. Case was reviewed with vascular surgery and there was no indication for anticoagulation. Repeat Doppler was recommended in one week. PET scan was negative for PE.  03/25/2016 - 2 months follow-up, patient has been doing well in the interim he continues using his diuretics and has lost another 5 pounds since the last visit. He  denies any lower extremity edema, orthopnea paroxysmal nocturnal dyspnea.He has been still recovering and hasn't been very active but he's walking in the mall without any chest pain or shortness of breath. Denies palpitations or syncope. He states that  he is using ranolazine only once a day as twice a day dose every same constipation.  06/22/16 - the patient is coming after 3 months, he changed his diet and feels well, lost another 25 lbs, minimal chest pain, bale to take care of his yard and repair lawnmowers. Mild DOE, no syncope, no LE edema. Needs ranexa samples.  Past Medical History  Diagnosis Date  . HLD (hyperlipidemia)   . Type II diabetes mellitus ([place])   . Paroxysmal atrial fibrillation (H[facility])     a. diagnosed on 11/2014 admission. Spontaneously converted to NSR. Placed on Eliquis;  b. 12/2015 Eliquis d/c'd 2/2 anemia and nosebleeds.  . Chronic combined systolic and diastolic CHF (congestive heart failure) ([place])     a. 11/2014 Echo: EF 40-45%, akinesis of the inferior, inferolateral and inferoseptal walls; overall mild to moderate reduction in LV function; grade 2 diastolic dysfunction; severe LAE; trace AI; moderate MR; mild TR with severely elevated pulmonary pressures. b. EF 25% in 04/2015, possibly due to HTN crisis;  c. Echo 8/16:  Mild LVH, EF 50-55%; d. 01/2016 Echo: EF 35-40%, Gr2 DD, mod MR, sev dil LA  . CAD (coronary artery disease)     a. s/p LHC on 11/24/14 w/ mild-mod dz in LAD. CTO of mLCx and OM2 with R-->L and L-->L collaterals. b. Elevated trop 04/2015 felt due to demand ischemia; c. OOH NSTEMI->conservatively managed.  . Renal insufficiency     a. Elev Cr 04/2015 requiring reduction in Eliquis.  . Obesity   . Hypertensive heart disease   . Moderate mitral regurgitation     a. 11/2014 Echo: Mod MR; b. 07/2015 Echo: Mild MR;  c. 01/2016 Echo: Mod MR - presumed to be ischemic in setting of OOH MI.  . Ischemic cardiomyopathy     a. 01/2016 Echo: EF 35-40%.  . Hemoptysis     a. 01/2016 in setting of OOH MI/pulmonary edema-->resolved prior to d/c.    Past Surgical History  Procedure Laterality Date  . Appendectomy    . Total knee arthroplasty Left 2013  . Tonsillectomy    . Joint replacement    . Cardiac catheterization   11/24/2014  . Left heart catheterization with coronary angiogram N/A 11/24/2014    Procedure: LEFT HEART CATHETERIZATION WITH CORONARY ANGIOGRAM;  Surgeon: Jettie Booze, MD;  Location: [facility] CATH LAB;  Service: Cardiovascular;  Laterality: N/A;  . Esophagogastroduodenoscopy (egd) with propofol N/A 07/16/2015    Procedure: ESOPHAGOGASTRODUODENOSCOPY (EGD) WITH PROPOFOL;  Surgeon: Carol Ada, MD;  Location: [facility] ENDOSCOPY;  Service: Endoscopy;  Laterality: N/A;  . Colonoscopy with propofol N/A 07/16/2015    Procedure: COLONOSCOPY WITH PROPOFOL;  Surgeon: Carol Ada, MD;  Location: [facility] ENDOSCOPY;  Service: Endoscopy;  Laterality: N/A;    Current Medications: Outpatient Prescriptions Prior to Visit  Medication Sig Dispense Refill  . aspirin EC 81 MG tablet Take 1 tablet (81 mg total) by mouth daily. 90 tablet 3  . atorvastatin (LIPITOR) 40 MG tablet TAKE ONE TABLET BY MOUTH ONCE DAILY AT  6PM 90 tablet 3  . carvedilol (COREG) 6.25 MG tablet Take 1 tablet (6.25 mg total) by mouth 2 (two) times daily with a meal. 60 tablet 6  . Cyanocobalamin (VITAMIN B 12 PO) Take 5,000 mcg by mouth daily.    Marland Kitchen  Eluxadoline (VIBERZI) 75 MG TABS Take 75 mg by mouth 2 (two) times daily as needed (diarhea).     . ferrous sulfate 325 (65 FE) MG tablet Take 325 mg by mouth daily with breakfast. Takes daily with orange juice    . furosemide (LASIX) 80 MG tablet Take 1 tablet (80 mg total) by mouth daily. 30 tablet 0  . isosorbide mononitrate (IMDUR) 30 MG 24 hr tablet Take 0.5 tablets (15 mg total) by mouth daily. 30 tablet 3  . multivitamin-iron-minerals-folic acid (CENTRUM) chewable tablet Chew 1 tablet by mouth daily.    . nitroGLYCERIN (NITROSTAT) 0.4 MG SL tablet Place 1 tablet (0.4 mg total) under the tongue every 5 (five) minutes x 3 doses as needed for chest pain. 25 tablet 3  . ranolazine (RANEXA) 1000 MG SR tablet Take 1 tablet (1,000 mg total) by mouth 2 (two) times daily. 60 tablet 2  . spironolactone  (ALDACTONE) 25 MG tablet Take 25 mg by mouth daily.    . tamsulosin (FLOMAX) 0.4 MG CAPS capsule Take 0.4 mg by mouth daily as needed (BPH).     No facility-administered medications prior to visit.     Allergies:   Lisinopril   Social History   Social History  . Marital Status: Married    Spouse Name: N/A  . Number of Children: N/A  . Years of Education: N/A   Social History Main Topics  . Smoking status: Never Smoker   . Smokeless tobacco: Never Used  . Alcohol Use: 8.4 oz/week    7 Shots of liquor, 7 Standard drinks or equivalent per week     Comment: Daily  . Drug Use: No  . Sexual Activity: Not Currently   Other Topics Concern  . None   Social History Narrative     Family History:  The patient's family history includes Cancer (age of onset: 71) in his father; Heart attack in his brother; Heart disease in his brother, sister, and sister; Stroke in his mother. There is no history of Hypertension.   ROS:   Please see the history of present illness.    Review of Systems  Hematologic/Lymphatic: Bruises/bleeds easily.  Gastrointestinal: Positive for diarrhea.  Neurological: Positive for loss of balance.  All other systems reviewed and are negative.   Physical Exam:    VS:  BP 132/72 mmHg  Pulse 72  Ht 5\' 8"  (1.727 m)  Wt 203 lb (92.08 kg)  BMI 30.87 kg/m2   GEN: Well nourished, well developed, in no acute distress HEENT: normal Neck: no JVD, no masses Cardiac: Normal 99991111, RRR; 2/6 systolic murmurs, rubs, or gallops, no edema  Respiratory:  clear to auscultation bilaterally; no wheezing, rhonchi or rales GI: soft, nontender  MS: no deformity or atrophy Skin: warm and dry  Neuro:  no focal deficits  Psych: Alert and oriented x 3, normal affect  Wt Readings from Last 3 Encounters:  06/23/16 203 lb (92.08 kg)  04/21/16 230 lb (104.327 kg)  03/25/16 194 lb 9.6 oz (88.27 kg)      Studies/Labs Reviewed:     EKG:  EKG is  ordered today.  The ekg ordered  today demonstrates NSR, HR 73, LAD, IVCD, QTc 480 ms, no change from prior tracing  Recent Labs: 01/22/2016: Magnesium 1.9 01/28/2016: B Natriuretic Peptide 915.7* 04/14/2016: ALT 23; BUN 20.3; Creatinine 1.9*; HGB 11.4*; Platelets 152; Potassium 4.6; Sodium 141   Recent Lipid Panel    Component Value Date/Time   CHOL 87 01/21/2016 0420  TRIG 50 01/21/2016 0420   HDL 50 01/21/2016 0420   CHOLHDL 1.7 01/21/2016 0420   VLDL 10 01/21/2016 0420   LDLCALC 27 01/21/2016 0420    Additional studies/ records that were reviewed today include:   Echo 01/20/16 Mild LVH, EF 35-40%, diffuse HK, grade 2 diastolic dysfunction, moderate MR, severe LAE, normal RVSF, mild RAE, mild TR, PASP 39 mmHg  Echo 07/30/15 Mild LVH, EF 50-55%, inf HK, Gr 1 DD, trivial AI, mild dilated Ao root, MAC, mild MR, severe LAE  Echo 04/25/15 Mild LVH, EF 30%, severe global HK, grade 1 diastolic dysfunction, mild aortic root dilatation (42 mm), MAC, mild MR, severe LAE, mild RAE, mild TR  Echo 03/05/15 EF 40-45%, inferior HK, grade 2 diastolic dysfunction, mild AI, MAC, moderate MR, moderate LAE, PASP 41 mmHg  LHC 11/24/14 LM: Patent LAD: Patent LCx: Occluded with right to left collaterals RCA: Mid moderate diffuse disease PCI: Unsuccessful attempt at PCI of the LCx-chronic total occlusion   ASSESSMENT/PLAN:     1. Acute on chronic combined systolic and diastolic heart failure: patient continues to lose weight - total of 30 lbs now!!!, he appears euvolemic and is asymptomatic we'll continue current regimen with aspirin and carvedilol and isosorbide mononitrate.  2. Ischemic CM - EF 35-40%. His ACE inhibitor was stopped due to worsening renal function during his recent hospital stay.  Would hold off on resuming his ACE inhibitor for now.  We will continue carvedilol and Imdur. It consists of blood pressure unable to hydralazine.  3. CAD - He has known chronically occluded LCx. Recent admission in 2/16 with  probable out of hospital completed inferior MI. This was treated conservatively. There was mention of possibly pursuing viabilitystudy. He has chronic, multiple comorbid morbid conditions.  He would be a poor candidate for dual antiplatelet Rx.  He would be a poor candidate for cardiac cath given CKD and high likelihood for contrast induced nephropathy.  He is currently asymptomatic, therefore we will hold off on a viability study. We will continue ranolazine and Imdur, improved after weight loss.  4. Paroxysmal atrial fibrillation: Maintaining NSR.  He could not tolerate Eliquis secondary to significant epistaxis. Continue ASA.    5. CKD (chronic kidney disease), unspecified stage: Check BMET today.  6. Hyperlipidemia: resolved.  7. Essential hypertension: Controlled.  8. DVT - He was noted to have right peroneal vein DVT in the hospital. Anticoagulation was not recommended. Notes from the hospital were reviewed. It was recommended to pursue a repeat duplex study 1 week after discharge to rule out extension of the DVT. I will arrange right lower extremity venous duplex.  9. Hyperkalemia - we will recheck today.  Time spent today - 45 minutes (reviewing chart, evaluating the patient, coordinating care).  Face to Face time > 50%.  Signed, Ena Dawley, MD  06/23/2016 9:22 AM    [facility] [place], [place], [place]  [address] Phone: [phone]; Fax: [phone]

## 2016-06-23 NOTE — Patient Instructions (Addendum)
Medication Instructions:  Your physician recommends that you continue on your current medications as directed. Please refer to the Current Medication list given to you today.  --- If you need a refill on your cardiac medications before your next appointment, please call your pharmacy. ---  Labwork: Your physician recommends that you return for lab work in: 4 months, prior to seeing Dr. Meda Coffee, for BMP, CBC w diff, LFT and Lipids.  You will need to be fasting for this blood work.  Testing/Procedures: None ordered  Follow-Up: Your physician recommends that you schedule a follow-up appointment in: 4 months with Dr. Meda Coffee.  Thank you for choosing [facility]!!

## 2016-07-15 DIAGNOSIS — I129 Hypertensive chronic kidney disease with stage 1 through stage 4 chronic kidney disease, or unspecified chronic kidney disease: Secondary | ICD-10-CM | POA: Diagnosis not present

## 2016-07-15 DIAGNOSIS — N183 Chronic kidney disease, stage 3 (moderate): Secondary | ICD-10-CM | POA: Diagnosis not present

## 2016-07-15 DIAGNOSIS — E669 Obesity, unspecified: Secondary | ICD-10-CM | POA: Diagnosis not present

## 2016-07-15 DIAGNOSIS — I2510 Atherosclerotic heart disease of native coronary artery without angina pectoris: Secondary | ICD-10-CM | POA: Diagnosis not present

## 2016-07-27 DIAGNOSIS — D3001 Benign neoplasm of right kidney: Secondary | ICD-10-CM | POA: Diagnosis not present

## 2016-07-27 DIAGNOSIS — D3002 Benign neoplasm of left kidney: Secondary | ICD-10-CM | POA: Diagnosis not present

## 2016-08-02 DIAGNOSIS — N2889 Other specified disorders of kidney and ureter: Secondary | ICD-10-CM | POA: Diagnosis not present

## 2016-08-02 DIAGNOSIS — D3002 Benign neoplasm of left kidney: Secondary | ICD-10-CM | POA: Diagnosis not present

## 2016-10-16 IMAGING — CR DG CHEST 1V PORT
1 series · 1 of 1 positions shown · non-contrast
Comparison: Portable chest x-ray April 23, 2015

CLINICAL DATA: Pulmonary edema, history of CHF, coronary artery
disease, and diabetes

EXAM:
PORTABLE CHEST - 1 VIEW

[AP]
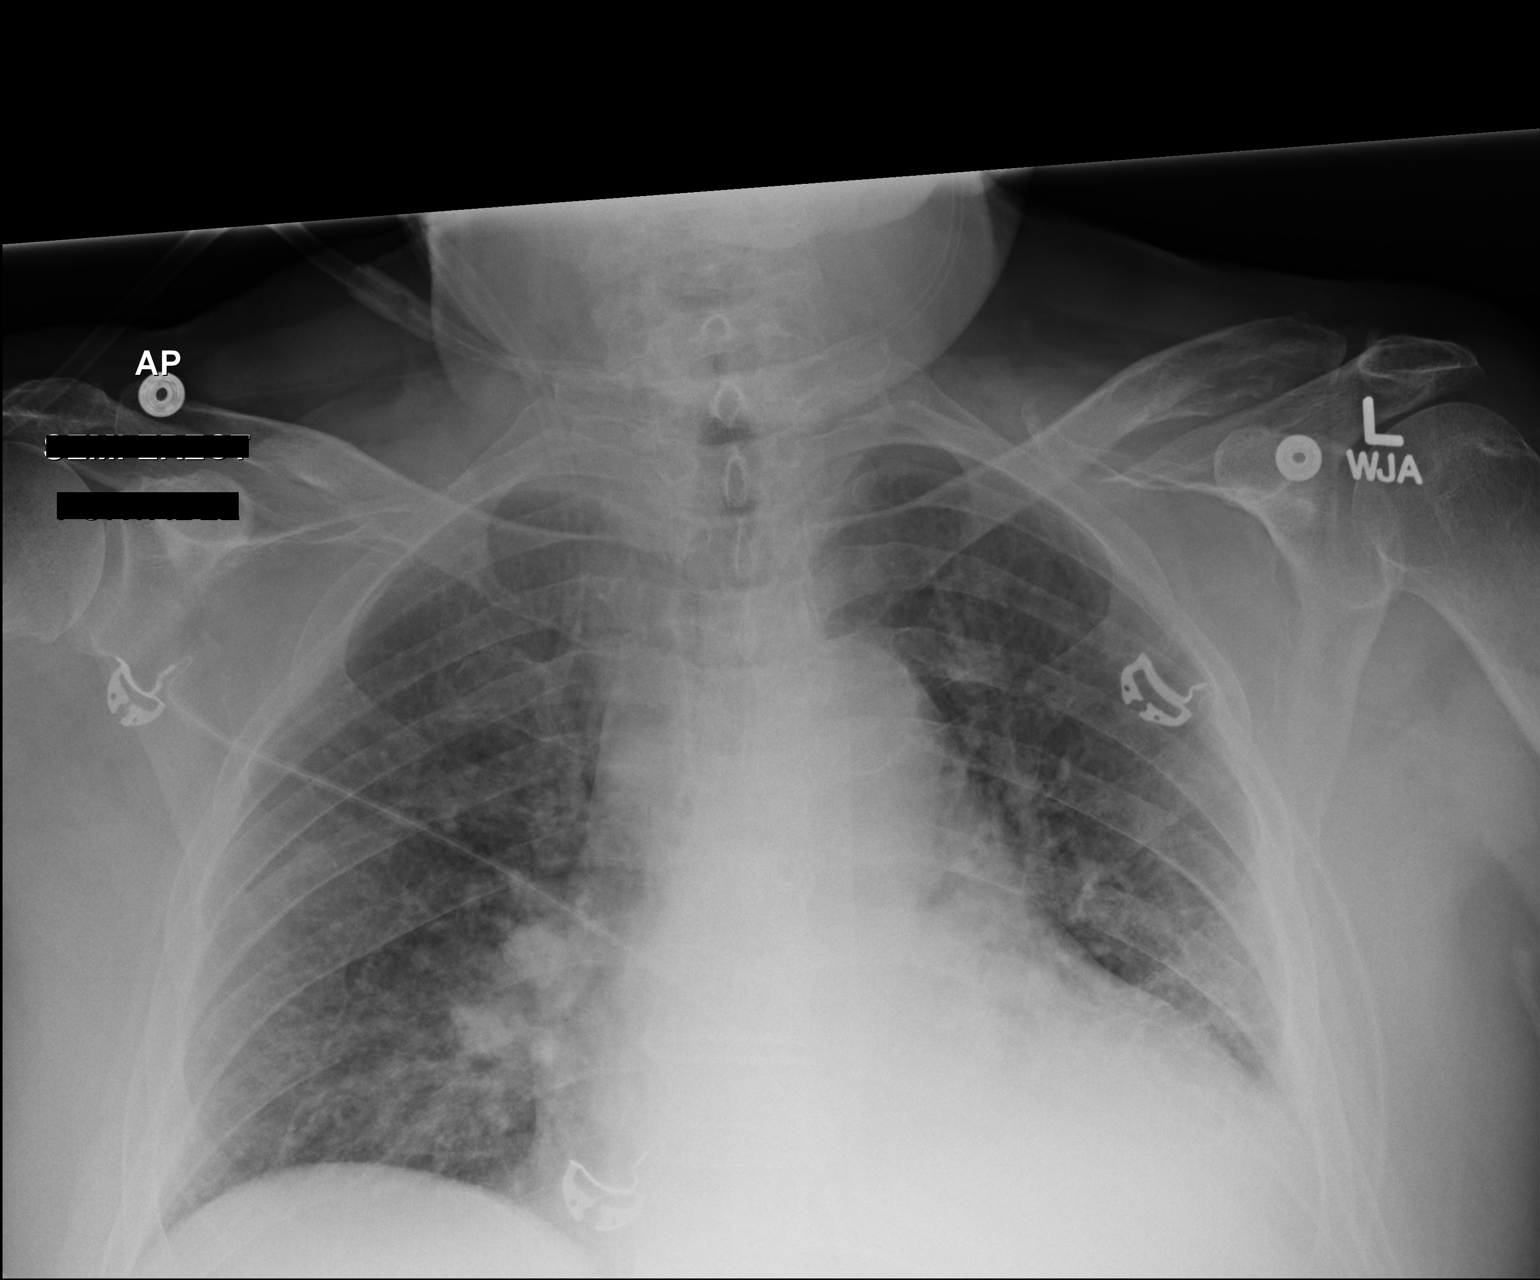

[1 of 1 positions shown; findings below may reference images not displayed]

FINDINGS: The lungs are adequately inflated. The interstitial markings remain
increased but have improved. The retrocardiac region on the left
remains dense but is less conspicuous. The left hemidiaphragm
remains largely obscured. The cardiac silhouette is enlarged. The
central pulmonary vascularity is engorged.
IMPRESSION: CHF with mild interval improvement in the appearance of the
pulmonary interstitium.

## 2016-10-19 ENCOUNTER — Other Ambulatory Visit: Payer: Self-pay | Admitting: Nurse Practitioner

## 2016-10-20 DIAGNOSIS — I10 Essential (primary) hypertension: Secondary | ICD-10-CM | POA: Diagnosis not present

## 2016-10-20 DIAGNOSIS — E1129 Type 2 diabetes mellitus with other diabetic kidney complication: Secondary | ICD-10-CM | POA: Diagnosis not present

## 2016-10-20 DIAGNOSIS — R8299 Other abnormal findings in urine: Secondary | ICD-10-CM | POA: Diagnosis not present

## 2016-10-20 DIAGNOSIS — N390 Urinary tract infection, site not specified: Secondary | ICD-10-CM | POA: Diagnosis not present

## 2016-10-20 DIAGNOSIS — Z125 Encounter for screening for malignant neoplasm of prostate: Secondary | ICD-10-CM | POA: Diagnosis not present

## 2016-10-24 DIAGNOSIS — I209 Angina pectoris, unspecified: Secondary | ICD-10-CM | POA: Diagnosis not present

## 2016-10-24 DIAGNOSIS — N401 Enlarged prostate with lower urinary tract symptoms: Secondary | ICD-10-CM | POA: Diagnosis not present

## 2016-10-24 DIAGNOSIS — D631 Anemia in chronic kidney disease: Secondary | ICD-10-CM | POA: Diagnosis not present

## 2016-10-24 DIAGNOSIS — I509 Heart failure, unspecified: Secondary | ICD-10-CM | POA: Diagnosis not present

## 2016-10-24 DIAGNOSIS — I8290 Acute embolism and thrombosis of unspecified vein: Secondary | ICD-10-CM | POA: Diagnosis not present

## 2016-10-24 DIAGNOSIS — E875 Hyperkalemia: Secondary | ICD-10-CM | POA: Diagnosis not present

## 2016-10-24 DIAGNOSIS — Z0000 Encounter for general adult medical examination without abnormal findings: Secondary | ICD-10-CM | POA: Diagnosis not present

## 2016-10-24 DIAGNOSIS — Z23 Encounter for immunization: Secondary | ICD-10-CM | POA: Diagnosis not present

## 2016-10-24 DIAGNOSIS — E668 Other obesity: Secondary | ICD-10-CM | POA: Diagnosis not present

## 2016-10-24 DIAGNOSIS — D472 Monoclonal gammopathy: Secondary | ICD-10-CM | POA: Diagnosis not present

## 2016-10-24 DIAGNOSIS — I4891 Unspecified atrial fibrillation: Secondary | ICD-10-CM | POA: Diagnosis not present

## 2016-10-24 DIAGNOSIS — Z6830 Body mass index (BMI) 30.0-30.9, adult: Secondary | ICD-10-CM | POA: Diagnosis not present

## 2016-10-25 ENCOUNTER — Other Ambulatory Visit: Payer: PPO | Admitting: *Deleted

## 2016-10-25 DIAGNOSIS — E785 Hyperlipidemia, unspecified: Secondary | ICD-10-CM | POA: Diagnosis not present

## 2016-10-25 DIAGNOSIS — Z79899 Other long term (current) drug therapy: Secondary | ICD-10-CM

## 2016-10-25 LAB — LIPID PANEL
Cholesterol: 96 mg/dL (ref <200)
HDL: 37 mg/dL — ABNORMAL LOW (ref >40)
LDL Cholesterol: 42 mg/dL (ref <100)
Total CHOL/HDL Ratio: 2.6 Ratio (ref <5.0)
Triglycerides: 85 mg/dL (ref <150)
VLDL: 17 mg/dL (ref <30)

## 2016-10-25 LAB — CBC WITH DIFFERENTIAL/PLATELET
Basophils Absolute: 56 cells/uL (ref 0–200)
Basophils Relative: 1 %
Eosinophils Absolute: 168 cells/uL (ref 15–500)
Eosinophils Relative: 3 %
HCT: 39.3 % (ref 38.5–50.0)
Hemoglobin: 12.7 g/dL — ABNORMAL LOW (ref 13.2–17.1)
Lymphocytes Relative: 28 %
Lymphs Abs: 1568 cells/uL (ref 850–3900)
MCH: 30.2 pg (ref 27.0–33.0)
MCHC: 32.3 g/dL (ref 32.0–36.0)
MCV: 93.3 fL (ref 80.0–100.0)
MPV: 10.8 fL (ref 7.5–12.5)
Monocytes Absolute: 560 cells/uL (ref 200–950)
Monocytes Relative: 10 %
Neutro Abs: 3248 cells/uL (ref 1500–7800)
Neutrophils Relative %: 58 %
Platelets: 184 10*3/uL (ref 140–400)
RBC: 4.21 MIL/uL (ref 4.20–5.80)
RDW: 14.1 % (ref 11.0–15.0)
WBC: 5.6 10*3/uL (ref 3.8–10.8)

## 2016-10-25 LAB — COMPREHENSIVE METABOLIC PANEL
ALT: 13 U/L (ref 9–46)
AST: 15 U/L (ref 10–35)
Albumin: 3.7 g/dL (ref 3.6–5.1)
Alkaline Phosphatase: 53 U/L (ref 40–115)
BUN: 21 mg/dL (ref 7–25)
CO2: 27 mmol/L (ref 20–31)
Calcium: 9 mg/dL (ref 8.6–10.3)
Chloride: 103 mmol/L (ref 98–110)
Creat: 1.97 mg/dL — ABNORMAL HIGH (ref 0.70–1.11)
Glucose, Bld: 120 mg/dL — ABNORMAL HIGH (ref 65–99)
Potassium: 4.6 mmol/L (ref 3.5–5.3)
Sodium: 138 mmol/L (ref 135–146)
Total Bilirubin: 0.7 mg/dL (ref 0.2–1.2)
Total Protein: 6.3 g/dL (ref 6.1–8.1)

## 2016-11-03 ENCOUNTER — Encounter (INDEPENDENT_AMBULATORY_CARE_PROVIDER_SITE_OTHER): Payer: Self-pay

## 2016-11-03 ENCOUNTER — Ambulatory Visit (INDEPENDENT_AMBULATORY_CARE_PROVIDER_SITE_OTHER): Payer: PPO | Admitting: Cardiology

## 2016-11-03 ENCOUNTER — Encounter: Payer: Self-pay | Admitting: Cardiology

## 2016-11-03 VITALS — BP 120/70 | HR 74 | Ht 68.0 in | Wt 222.8 lb

## 2016-11-03 DIAGNOSIS — I255 Ischemic cardiomyopathy: Secondary | ICD-10-CM

## 2016-11-03 DIAGNOSIS — I5042 Chronic combined systolic (congestive) and diastolic (congestive) heart failure: Secondary | ICD-10-CM | POA: Diagnosis not present

## 2016-11-03 DIAGNOSIS — I2510 Atherosclerotic heart disease of native coronary artery without angina pectoris: Secondary | ICD-10-CM

## 2016-11-03 DIAGNOSIS — I10 Essential (primary) hypertension: Secondary | ICD-10-CM | POA: Diagnosis not present

## 2016-11-03 DIAGNOSIS — D472 Monoclonal gammopathy: Secondary | ICD-10-CM

## 2016-11-03 MED ORDER — ATORVASTATIN CALCIUM 40 MG PO TABS: ORAL_TABLET | ORAL | 3 refills | Status: AC

## 2016-11-03 MED ORDER — SPIRONOLACTONE 25 MG PO TABS: 25.0000 mg | ORAL_TABLET | Freq: Every day | ORAL | 3 refills | Status: AC

## 2016-11-03 MED ORDER — RANOLAZINE ER 1000 MG PO TB12
1000.0000 mg | ORAL_TABLET | Freq: Two times a day (BID) | ORAL | 3 refills | Status: AC
Start: 2016-11-03 — End: ?

## 2016-11-03 MED ORDER — ISOSORBIDE MONONITRATE ER 30 MG PO TB24: 15.0000 mg | ORAL_TABLET | Freq: Every day | ORAL | 3 refills | Status: AC

## 2016-11-03 MED ORDER — CARVEDILOL 6.25 MG PO TABS: 6.2500 mg | ORAL_TABLET | Freq: Two times a day (BID) | ORAL | 3 refills | Status: AC

## 2016-11-03 MED ORDER — FUROSEMIDE 80 MG PO TABS: 80.0000 mg | ORAL_TABLET | Freq: Every day | ORAL | 0 refills | Status: AC

## 2016-11-03 NOTE — Patient Instructions (Addendum)
Medication Instructions:   STOP TAKING IRON NOW    Follow-Up:  Your physician wants you to follow-up in: [place] will receive a reminder letter in the mail two months in advance. If you don't receive a letter, please call our office to schedule the follow-up appointment.        If you need a refill on your cardiac medications before your next appointment, please call your pharmacy.

## 2016-11-03 NOTE — Progress Notes (Signed)
Patient ID: [name] [name], male   DOB: 1930-04-14, 82 y.o.   MRN: [id number]      Cardiology Office Note:    Date:  11/03/2016   ID:  [name] [name], DOB 09-15-1930, MRN [id number]  PCP:  Haywood Pao, MD  Cardiologist:  Dr. Ena Dawley   Electrophysiologist:  n/a  No chief complaint on file.  History of Present Illness:     [name] [name] is a 82 y.o. male with a hx of diabetes, HTN, BPH. He was admitted in [id number] with systolic/diastolic HF in the setting of AF with RVR. Cardiac enzymes were elevated consistent with a non-STEMI. Cardiac catheterization demonstrated moderate disease in the LAD, chronic total occlusion of the mid left circumflex and OM2 branch with right to left and left to left collaterals and moderate disease in the RCA. Medical therapy was recommended. CTO PCI of the LCx could be considered if the patient had symptoms of refractory angina. He converted to NSR on diltiazem drip. EF was 40 at 45% by echocardiogram.   Admitted 5/16 with a/c respiratory failure in the setting of a/c combined systolic and diastolic CHF and hypertensive emergency. Hospital stay c/b by AKI on CKD with IV diuresis, elevated Troponin levels. OP stress testing could be considered. Repeat echo demonstrated worsening LV function with EF 30% felt to be likely depressed from hypertensive crisis. OP sleep test to rule out sleep apnea recommended.   Echo in 8/16 demonstrated normal LVF, inf HK, Gr 1 diastolic dysfunction. Seen by Dr. Ena Dawley 12/10/15. Isosorbide and Ranolazine were added due to increased angina. The patient was having frequent nosebleeds and had decreased Eliquis to QD. This was then [place]. He was then seen by Dr. Candee Furbish on 12/25/15 with symptomatic hypotension. He had been to the ED and received IVFs one week earlier. BP was 72/50. Imdur had been stopped. BP was still in the 90s when he saw Dr. Marlou Porch. Lasix and Spironolactone were DC'd. Carvedilol and Ramipril doses  were also reduced. He became symptomatic again and received 1L IVFs again in our office.  He was then admitted 2/15-2/19 with an out of hospital non-STEMI. He presented with progressively worsening exertional chest pain and dyspnea and hemoptysis. Troponin was over 65. It was felt that he completed a large inferior MI. Conservative therapy was recommended. Cardiac catheterization was deferred. He is not an ideal candidate for dual antiplatelet therapy. He was maintained on aspirin, statin, beta blocker, ACE inhibitor and Ranexa. Long-acting nitrates were added. He was seen by pulmonology for hemoptysis. CT scan demonstrated findings that were thought to be consistent with pulmonary edema leading to fractured capillary syndrome and hemoptysis. Echo demonstrated EF 35-40%, moderate MR, moderate diastolic dysfunction. Of note, records indicate that viability evaluation could be considered in the outpatient setting to determine if it would be appropriate to pursue cardiac catheterization plus/minus percutaneous coronary intervention.  He was admitted again 2/23-2/26 with worsening cough. Patient was treated with antibiotics. Influenza PCR was negative. Ramipril was held given reports of cough as well as acute kidney injury. He was diuresed for volume excess. He was noted to have a distal right popliteal DVT on lower extremity venous duplex. Case was reviewed with vascular surgery and there was no indication for anticoagulation. Repeat Doppler was recommended in one week. PET scan was negative for PE.  03/25/2016 - 2 months follow-up, patient has been doing well in the interim he continues using his diuretics and has lost another 5 pounds since the last visit.  He denies any lower extremity edema, orthopnea paroxysmal nocturnal dyspnea.He has been still recovering and hasn't been very active but he's walking in the mall without any chest pain or shortness of breath. Denies palpitations or syncope. He states that  he is using ranolazine only once a day as twice a day dose every same constipation.  11/03/16 - this is 6 months follow-up, patient looks and feels great, he denies any shortness of breath or chest pain, he hasn't been very active and stop watching his diet closely so he gained 20 pounds back. He denies any orthopnea paroxysmal nocturnal dyspnea there is no lower extremity edema palpitations or syncope. He has been compliant to his meds and has no side effects.    Past Medical History:  Diagnosis Date  . CAD (coronary artery disease)    a. s/p LHC on 11/24/14 w/ mild-mod dz in LAD. CTO of mLCx and OM2 with R-->L and L-->L collaterals. b. Elevated trop 04/2015 felt due to demand ischemia; c. OOH NSTEMI->conservatively managed.  . Chronic combined systolic and diastolic CHF (congestive heart failure) ([place])    a. 11/2014 Echo: EF 40-45%, akinesis of the inferior, inferolateral and inferoseptal walls; overall mild to moderate reduction in LV function; grade 2 diastolic dysfunction; severe LAE; trace AI; moderate MR; mild TR with severely elevated pulmonary pressures. b. EF 25% in 04/2015, possibly due to HTN crisis;  c. Echo 8/16:  Mild LVH, EF 50-55%; d. 01/2016 Echo: EF 35-40%, Gr2 DD, mod MR, sev dil LA  . Hemoptysis    a. 01/2016 in setting of OOH MI/pulmonary edema-->resolved prior to d/c.  [facility] HLD (hyperlipidemia)   . Hypertensive heart disease   . Ischemic cardiomyopathy    a. 01/2016 Echo: EF 35-40%.  . Moderate mitral regurgitation    a. 11/2014 Echo: Mod MR; b. 07/2015 Echo: Mild MR;  c. 01/2016 Echo: Mod MR - presumed to be ischemic in setting of OOH MI.  . Obesity   . Paroxysmal atrial fibrillation (HCC)    a. diagnosed on 11/2014 admission. Spontaneously converted to NSR. Placed on Eliquis;  b. 12/2015 Eliquis d/c'd 2/2 anemia and nosebleeds.  . Renal insufficiency    a. Elev Cr 04/2015 requiring reduction in Eliquis.  . Type II diabetes mellitus ([place])     Past Surgical History:  Procedure  Laterality Date  . APPENDECTOMY    . CARDIAC CATHETERIZATION  11/24/2014  . COLONOSCOPY WITH PROPOFOL N/A 07/16/2015   Procedure: COLONOSCOPY WITH PROPOFOL;  Surgeon: Carol Ada, MD;  Location: [facility];  Service: Endoscopy;  Laterality: N/A;  . ESOPHAGOGASTRODUODENOSCOPY (EGD) WITH PROPOFOL N/A 07/16/2015   Procedure: ESOPHAGOGASTRODUODENOSCOPY (EGD) WITH PROPOFOL;  Surgeon: Carol Ada, MD;  Location: [facility];  Service: Endoscopy;  Laterality: N/A;  . JOINT REPLACEMENT    . LEFT HEART CATHETERIZATION WITH CORONARY ANGIOGRAM N/A 11/24/2014   Procedure: LEFT HEART CATHETERIZATION WITH CORONARY ANGIOGRAM;  Surgeon: Jettie Booze, MD;  Location: [facility] CATH LAB;  Service: Cardiovascular;  Laterality: N/A;  . TONSILLECTOMY    . TOTAL KNEE ARTHROPLASTY Left 2013    Current Medications: Outpatient Medications Prior to Visit  Medication Sig Dispense Refill  . aspirin EC 81 MG tablet Take 1 tablet (81 mg total) by mouth daily. 90 tablet 3  . atorvastatin (LIPITOR) 40 MG tablet TAKE ONE TABLET BY MOUTH ONCE DAILY AT  6PM 90 tablet 3  . carvedilol (COREG) 6.25 MG tablet Take 1 tablet (6.25 mg total) by mouth 2 (two) times daily with a meal.  60 tablet 6  . Cyanocobalamin (VITAMIN B 12 PO) Take 5,000 mcg by mouth daily.    . Eluxadoline (VIBERZI) 75 MG TABS Take 75 mg by mouth 2 (two) times daily as needed (diarhea).     . ferrous sulfate 325 (65 FE) MG tablet Take 325 mg by mouth daily with breakfast. Takes daily with orange juice    . furosemide (LASIX) 80 MG tablet Take 1 tablet (80 mg total) by mouth daily. 30 tablet 0  . isosorbide mononitrate (IMDUR) 30 MG 24 hr tablet Take 0.5 tablets (15 mg total) by mouth daily. 30 tablet 3  . multivitamin-iron-minerals-folic acid (CENTRUM) chewable tablet Chew 1 tablet by mouth daily.    . nitroGLYCERIN (NITROSTAT) 0.4 MG SL tablet Place 1 tablet (0.4 mg total) under the tongue every 5 (five) minutes x 3 doses as needed for chest pain. 25 tablet  3  . ranolazine (RANEXA) 1000 MG SR tablet Take 1 tablet (1,000 mg total) by mouth 2 (two) times daily. 60 tablet 2  . spironolactone (ALDACTONE) 25 MG tablet Take 25 mg by mouth daily.    . tamsulosin (FLOMAX) 0.4 MG CAPS capsule Take 1 capsule (0.4 mg total) by mouth daily as needed (BPH). 30 capsule 4   No facility-administered medications prior to visit.      Allergies:   Lisinopril   Social History   Social History  . Marital status: Married    Spouse name: N/A  . Number of children: N/A  . Years of education: N/A   Social History Main Topics  . Smoking status: Never Smoker  . Smokeless tobacco: Never Used  . Alcohol use 8.4 oz/week    7 Shots of liquor, 7 Standard drinks or equivalent per week     Comment: Daily  . Drug use: No  . Sexual activity: Not Currently   Other Topics Concern  . None   Social History Narrative  . None     Family History:  The patient's family history includes Cancer (age of onset: 24) in his father; Heart attack in his brother; Heart disease in his brother, sister, and sister; Stroke in his mother.   ROS:   Please see the history of present illness.    Review of Systems  Hematologic/Lymphatic: Bruises/bleeds easily.  Gastrointestinal: Positive for diarrhea.  Neurological: Positive for loss of balance.  All other systems reviewed and are negative.   Physical Exam:    VS:  BP 120/70   Pulse 74   Ht 5\' 8"  (1.727 m)   Wt 222 lb 12.8 oz (101.1 kg)   BMI 33.88 kg/m    GEN: Well nourished, well developed, in no acute distress  HEENT: normal  Neck: no JVD, no masses Cardiac: Normal 99991111, RRR; 2/6 systolic murmurs, rubs, or gallops, no edema  Respiratory:  clear to auscultation bilaterally; no wheezing, rhonchi or rales GI: soft, nontender  MS: no deformity or atrophy  Skin: warm and dry  Neuro:  no focal deficits  Psych: Alert and oriented x 3, normal affect  Wt Readings from Last 3 Encounters:  11/03/16 222 lb 12.8 oz (101.1  kg)  06/23/16 203 lb (92.1 kg)  04/21/16 230 lb (104.3 kg)      Studies/Labs Reviewed:     EKG:  EKG is  ordered today.  The ekg ordered today demonstrates NSR, HR 73, LAD, IVCD, QTc 480 ms, no change from prior tracing  Recent Labs: 01/22/2016: Magnesium 1.9 01/28/2016: B Natriuretic Peptide 915.7 10/25/2016: ALT  13; BUN 21; Creat 1.97; Hemoglobin 12.7; Platelets 184; Potassium 4.6; Sodium 138   Recent Lipid Panel    Component Value Date/Time   CHOL 96 10/25/2016 0952   TRIG 85 10/25/2016 0952   HDL 37 (L) 10/25/2016 0952   CHOLHDL 2.6 10/25/2016 0952   VLDL 17 10/25/2016 0952   LDLCALC 42 10/25/2016 0952    Additional studies/ records that were reviewed today include:   Echo 01/20/16 Mild LVH, EF 35-40%, diffuse HK, grade 2 diastolic dysfunction, moderate MR, severe LAE, normal RVSF, mild RAE, mild TR, PASP 39 mmHg  Echo 07/30/15 Mild LVH, EF 50-55%, inf HK, Gr 1 DD, trivial AI, mild dilated Ao root, MAC, mild MR, severe LAE  Echo 04/25/15 Mild LVH, EF 30%, severe global HK, grade 1 diastolic dysfunction, mild aortic root dilatation (42 mm), MAC, mild MR, severe LAE, mild RAE, mild TR  Echo 03/05/15 EF 40-45%, inferior HK, grade 2 diastolic dysfunction, mild AI, MAC, moderate MR, moderate LAE, PASP 41 mmHg  LHC 11/24/14 LM: Patent LAD: Patent LCx: Occluded with right to left collaterals RCA: Mid moderate diffuse disease PCI: Unsuccessful attempt at PCI of the LCx-chronic total occlusion   ASSESSMENT/PLAN:     1. Acute on chronic combined systolic and diastolic heart failure:Patient is euvolemic despite gaining weight, we will continue the same regimen with aspirin and carvedilol and isosorbide mononitrate.  2. Ischemic CM - EF 35-40%. His ACE inhibitor was stopped due to worsening renal function, continue carvedilol and Imdur. It consists of blood pressure unable to hydralazine.  3. CAD - He has known chronically occluded LCx. Recent admission in 2/16 with probable  out of hospital completed inferior MI. This was treated conservatively. There was mention of possibly pursuing viabilitystudy. He has chronic, multiple comorbid morbid conditions.  He would be a poor candidate for dual antiplatelet Rx.  He would be a poor candidate for cardiac cath given CKD and high likelihood for contrast induced nephropathy.  He is currently asymptomatic, therefore we will hold off on a viability study. We will continue ranolazine and Imdur, he is encouraged to continue exercising and try to lose weight again.  4. Paroxysmal atrial fibrillation: Maintaining NSR.  He could not tolerate Eliquis secondary to significant epistaxis. Continue ASA.    5. CKD (chronic kidney disease), unspecified stage: Most recent creatinine 1.9 improved from 2.6.  6. Essential hypertension: Controlled.  8. DVT - He was noted to have right peroneal vein DVT in the hospital. Anticoagulation was not recommended.   Follow-up in 4 months. We will perform blood work prior to the appointment including BMP, CBC, LFTs, lipids.  Signed, Ena Dawley, MD  11/03/2016 10:03 AM    [address], [place], [place]  [address] Phone: [phone]; Fax: [phone]

## 2016-11-24 ENCOUNTER — Other Ambulatory Visit: Payer: Self-pay | Admitting: Cardiology

## 2016-12-16 ENCOUNTER — Other Ambulatory Visit: Payer: Self-pay | Admitting: Cardiology

## 2016-12-16 DIAGNOSIS — I5022 Chronic systolic (congestive) heart failure: Secondary | ICD-10-CM

## 2016-12-16 DIAGNOSIS — E785 Hyperlipidemia, unspecified: Secondary | ICD-10-CM

## 2016-12-16 DIAGNOSIS — I5042 Chronic combined systolic (congestive) and diastolic (congestive) heart failure: Secondary | ICD-10-CM

## 2016-12-16 DIAGNOSIS — I10 Essential (primary) hypertension: Secondary | ICD-10-CM

## 2017-03-05 DIAGNOSIS — 419620001 Death: Secondary | SNOMED CT | POA: Diagnosis not present

## 2017-03-05 DEATH — deceased

## 2017-04-11 ENCOUNTER — Telehealth: Payer: Self-pay | Admitting: Oncology

## 2017-04-11 NOTE — Telephone Encounter (Signed)
Patient wife called to cancell all appointments.  Patient passed away March 08, 2017

## 2017-04-13 ENCOUNTER — Other Ambulatory Visit: Payer: PPO

## 2017-04-20 ENCOUNTER — Ambulatory Visit: Payer: PPO | Admitting: Oncology

## 2017-07-22 IMAGING — NM NM PULMONARY VENT & PERF
12 series · 12 of 12 positions shown · non-contrast
Comparison: None.

CLINICAL DATA: Shortness of breath and cough.  Initial encounter.

EXAM:
NUCLEAR MEDICINE VENTILATION - PERFUSION LUNG SCAN
TECHNIQUE: Ventilation images were obtained in multiple projections using
inhaled aerosol Sc-RRm DTPA. Perfusion images were obtained in
multiple projections after intravenous injection of Sc-RRm MAA.
RADIOPHARMACEUTICALS:  30.1 millicuries 6echnetium-MMm DTPA aerosol
inhalation and CT chest 01/20/2016 and PA and lateral chest
01/28/2016. [DATE] millicuries 6echnetium-MMm MAA IV

[Series 1: ant/post vent · 4.14mm/px · 1 of 1 slices shown (1 of 2)]
[im 1/1]
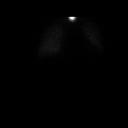

[Series 1: ant/post vent · 4.14mm/px · 1 of 1 slices shown (2 of 2)]
[im 1/1]
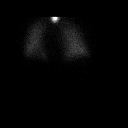

[Series 2: lao/rpo vent · 4.14mm/px · 1 of 1 slices shown (1 of 2)]
[im 1/1]
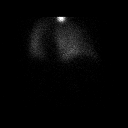

[Series 2: lao/rpo vent · 4.14mm/px · 1 of 1 slices shown (2 of 2)]
[im 1/1]
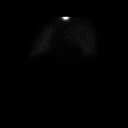

[Series 3: lpo/rao vent · 4.14mm/px · 1 of 1 slices shown (1 of 2)]
[im 1/1]
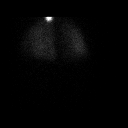

[Series 3: lpo/rao vent · 4.14mm/px · 1 of 1 slices shown (2 of 2)]
[im 1/1]
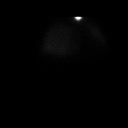

[Series 6: lpo/rao perf · 4.14mm/px · 1 of 1 slices shown (1 of 2)]
[im 1/1]
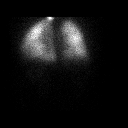

[Series 6: lpo/rao perf · 4.14mm/px · 1 of 1 slices shown (2 of 2)]
[im 1/1]
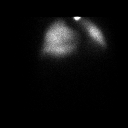

[Series 7: ant/post perf · 4.14mm/px · 1 of 1 slices shown (1 of 2)]
[im 1/1]
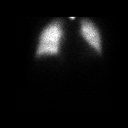

[Series 7: ant/post perf · 4.14mm/px · 1 of 1 slices shown (2 of 2)]
[im 1/1]
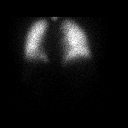

[Series 8: lao/rpo perf · 4.14mm/px · 1 of 1 slices shown (1 of 2)]
[im 1/1]
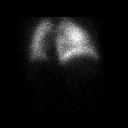

[Series 8: lao/rpo perf · 4.14mm/px · 1 of 1 slices shown (2 of 2)]
[im 1/1]
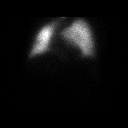

[12 of 12 positions shown; findings below may reference images not displayed]

FINDINGS: Ventilation: No focal ventilation defect. Lateral images could not
be obtained due to the patient's debilitated condition.

Perfusion: No wedge shaped peripheral perfusion defects to suggest
acute pulmonary embolism. Lateral images could not be obtained due
the patient's debilitated condition.
IMPRESSION: Negative for pulmonary embolus.

## 2017-07-22 IMAGING — DX DG CHEST 2V
2 series · 2 of 2 positions shown · non-contrast
Comparison: CT and radiographs 01/20/2016.

CLINICAL DATA: Cough with weakness and shortness of breath for 2
days.

EXAM:
CHEST  2 VIEW

[chest pa]
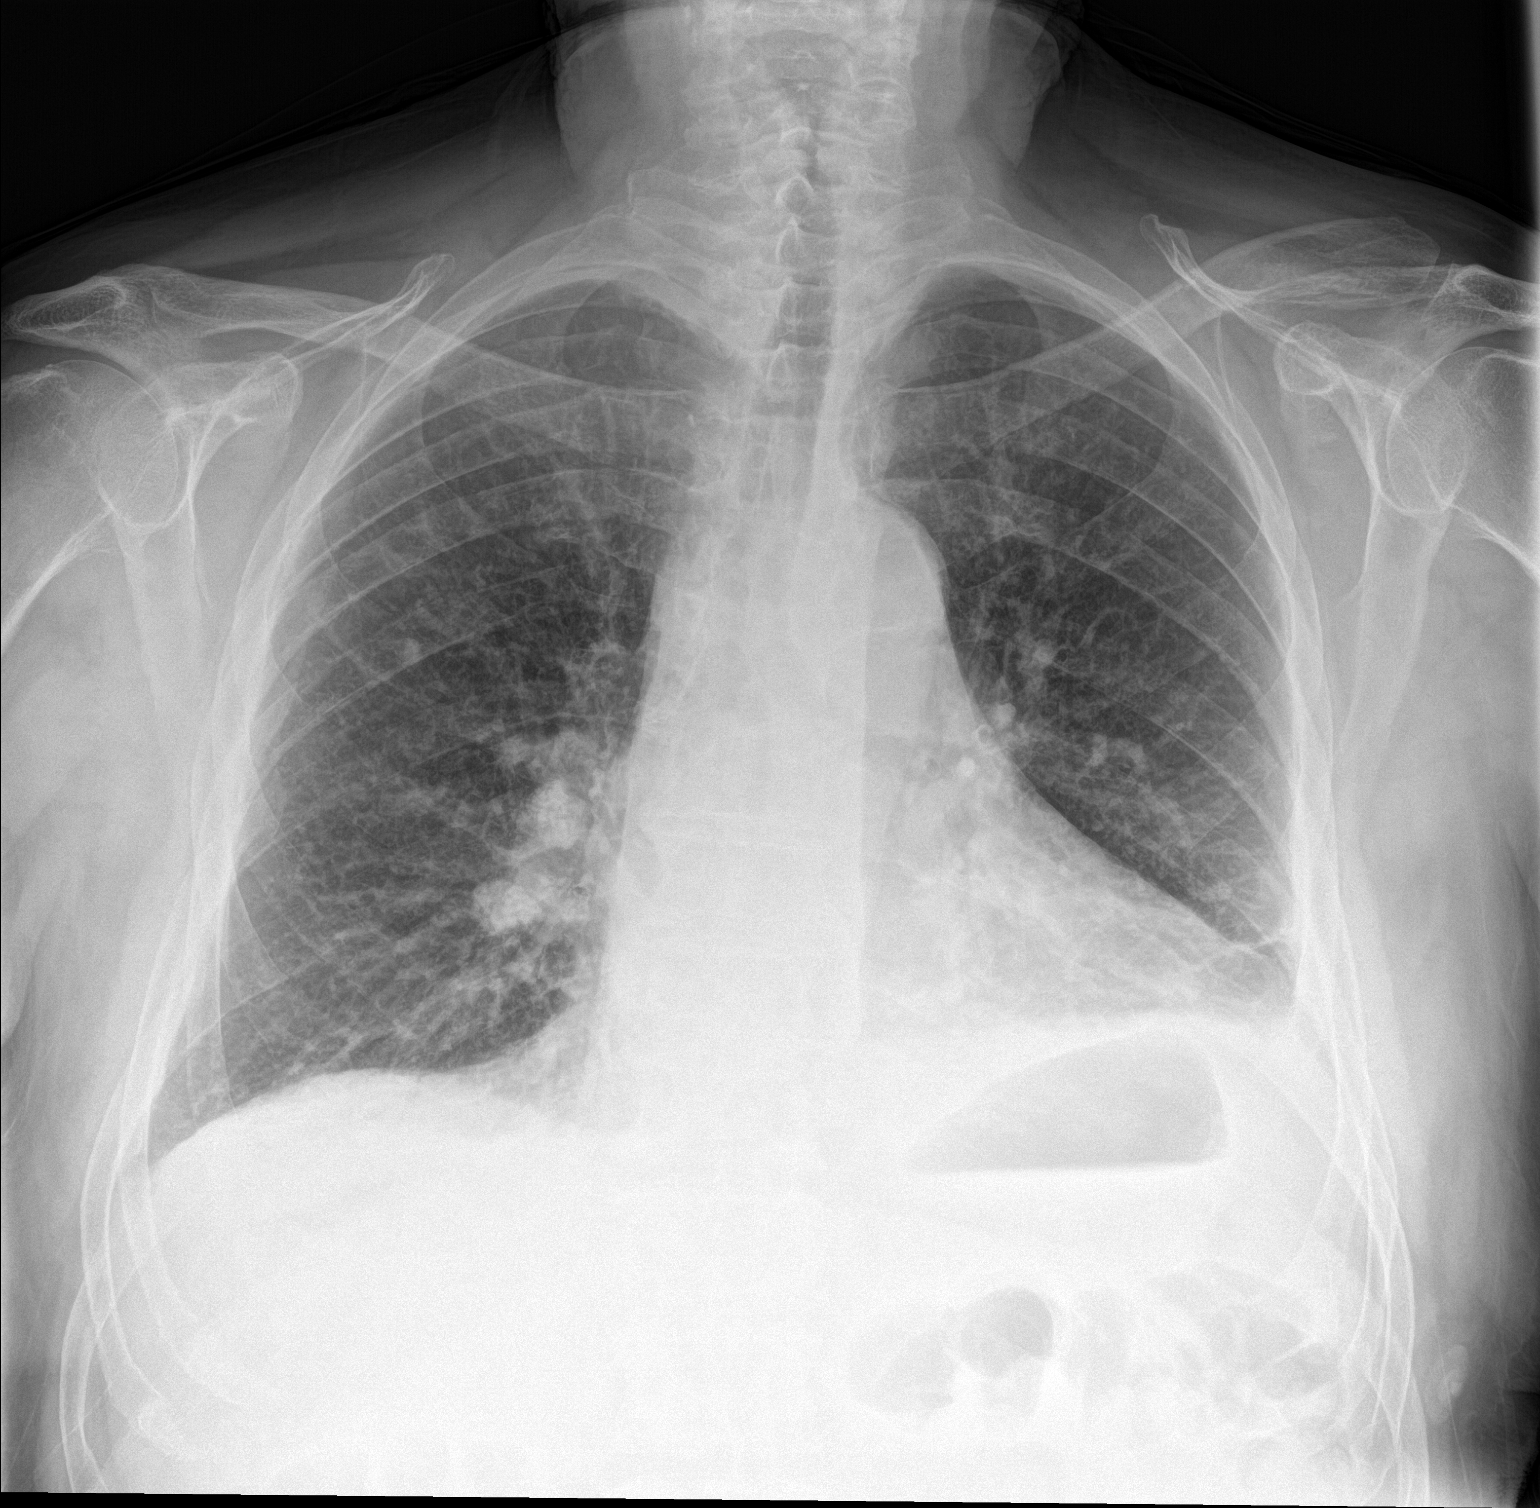

[chest lat]
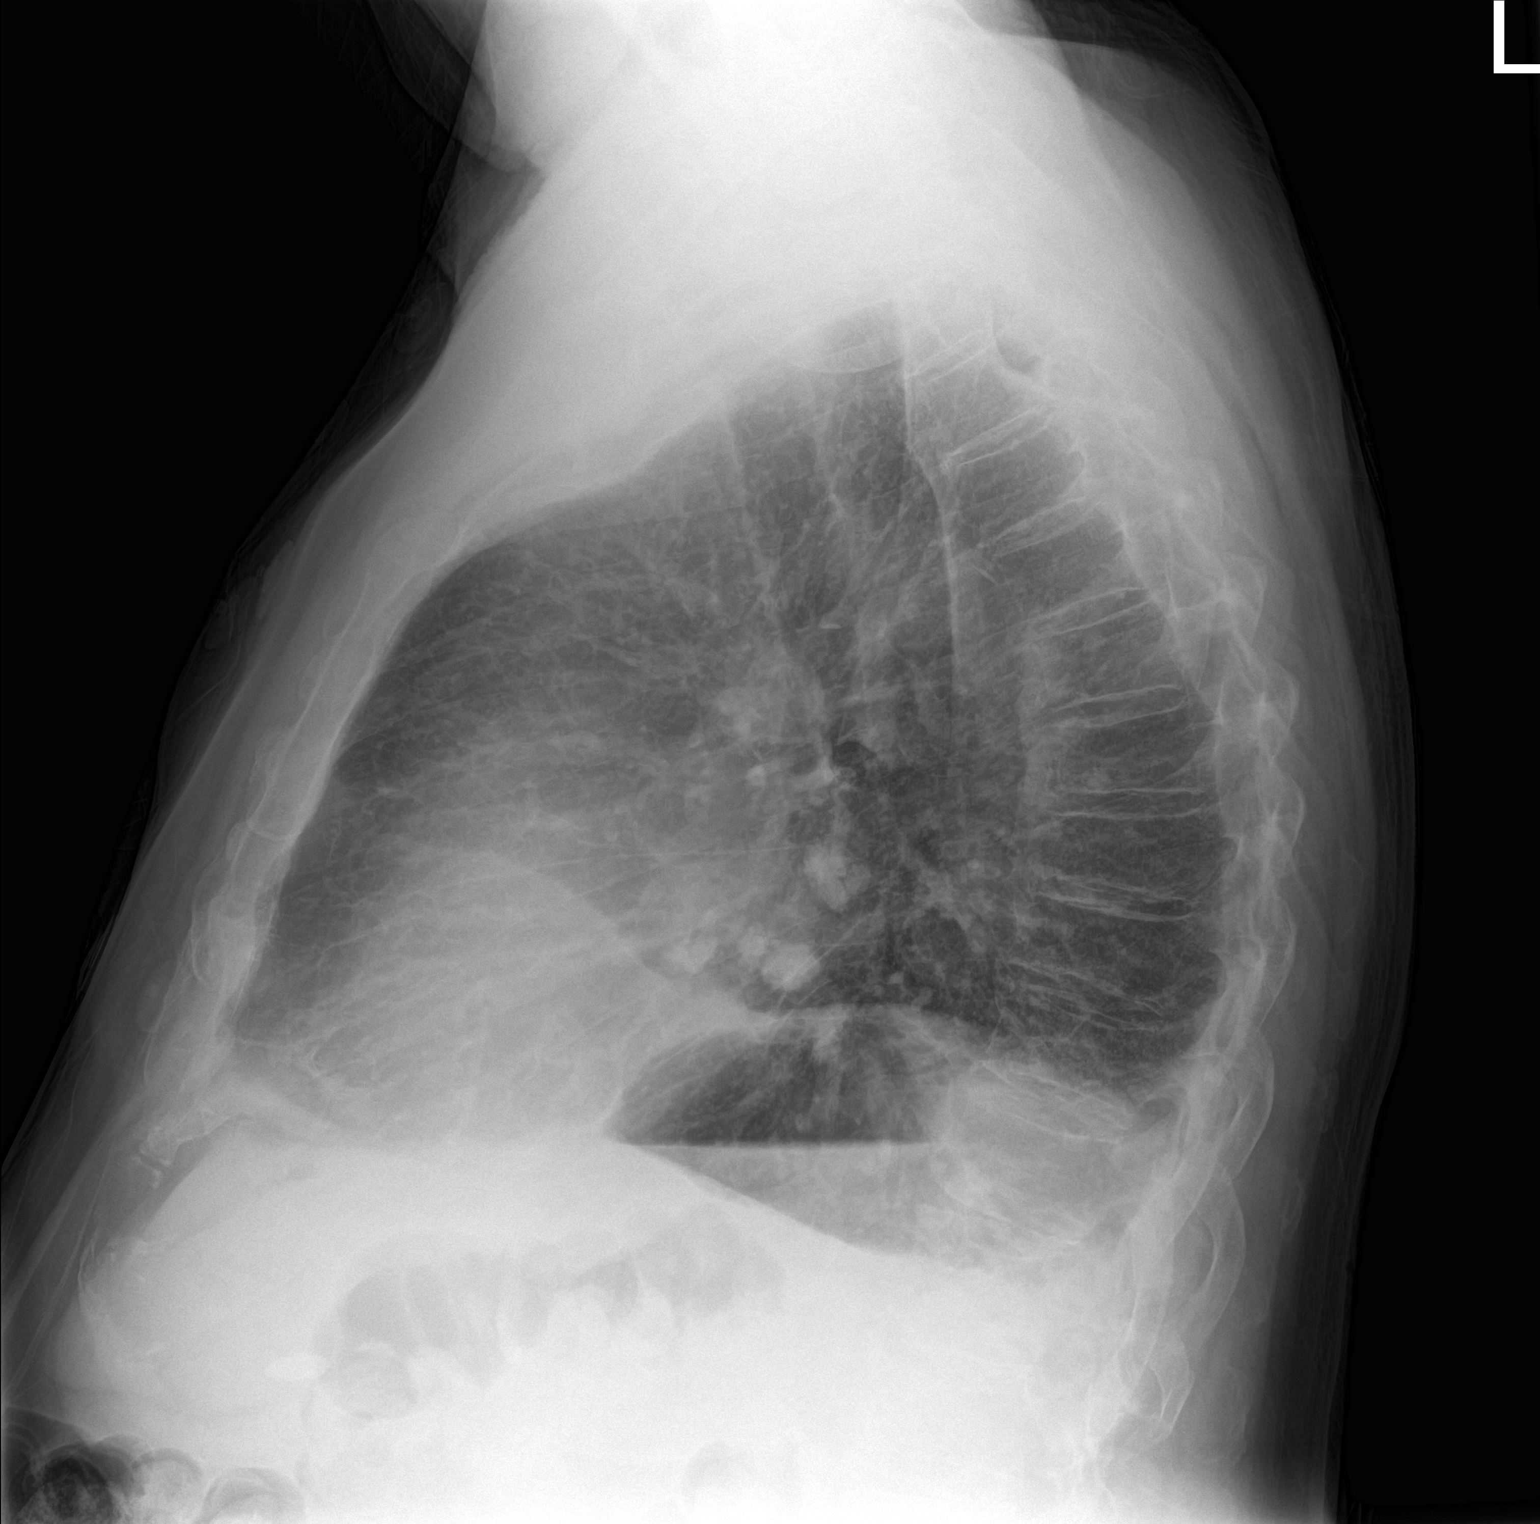

[2 of 2 positions shown; findings below may reference images not displayed]

FINDINGS: The heart size and mediastinal contours are stable. There are
calcified right hilar lymph nodes. The asymmetric patchy airspace
opacities within the right lung on the prior examination have
improved. Residual interstitial prominence appears chronic. There
are persistent bilateral pleural effusions. Right lung granuloma
noted. The bones are unchanged.
IMPRESSION: Interval resolution of asymmetric right-sided airspace disease
demonstrated on radiographs done 8 days ago. Persistent bilateral
pleural effusions with evidence of underlying granulomatous disease.
